# Patient Record
Sex: Female | Born: 1950
Health system: Southern US, Community
[De-identification: ages and names within clinical notes are randomized; demographics above are authoritative.]

## PROBLEM LIST (undated history)

## (undated) DIAGNOSIS — G8929 Other chronic pain: Secondary | ICD-10-CM

## (undated) DIAGNOSIS — L4050 Arthropathic psoriasis, unspecified: Secondary | ICD-10-CM

## (undated) DIAGNOSIS — G43909 Migraine, unspecified, not intractable, without status migrainosus: Secondary | ICD-10-CM

## (undated) DIAGNOSIS — E785 Hyperlipidemia, unspecified: Secondary | ICD-10-CM

## (undated) DIAGNOSIS — Z9889 Other specified postprocedural states: Secondary | ICD-10-CM

## (undated) DIAGNOSIS — G5601 Carpal tunnel syndrome, right upper limb: Secondary | ICD-10-CM

## (undated) DIAGNOSIS — Z5189 Encounter for other specified aftercare: Secondary | ICD-10-CM

## (undated) DIAGNOSIS — M1990 Unspecified osteoarthritis, unspecified site: Secondary | ICD-10-CM

## (undated) DIAGNOSIS — G5621 Lesion of ulnar nerve, right upper limb: Secondary | ICD-10-CM

## (undated) DIAGNOSIS — T7840XA Allergy, unspecified, initial encounter: Secondary | ICD-10-CM

## (undated) DIAGNOSIS — I10 Essential (primary) hypertension: Secondary | ICD-10-CM

## (undated) DIAGNOSIS — R0609 Other forms of dyspnea: Secondary | ICD-10-CM

## (undated) DIAGNOSIS — K219 Gastro-esophageal reflux disease without esophagitis: Secondary | ICD-10-CM

## (undated) DIAGNOSIS — D649 Anemia, unspecified: Secondary | ICD-10-CM

## (undated) DIAGNOSIS — M4802 Spinal stenosis, cervical region: Secondary | ICD-10-CM

## (undated) DIAGNOSIS — I700 Atherosclerosis of aorta: Secondary | ICD-10-CM

## (undated) DIAGNOSIS — R112 Nausea with vomiting, unspecified: Secondary | ICD-10-CM

## (undated) DIAGNOSIS — M8700 Idiopathic aseptic necrosis of unspecified bone: Secondary | ICD-10-CM

## (undated) DIAGNOSIS — G709 Myoneural disorder, unspecified: Secondary | ICD-10-CM

## (undated) DIAGNOSIS — L400 Psoriasis vulgaris: Secondary | ICD-10-CM

## (undated) DIAGNOSIS — M4326 Fusion of spine, lumbar region: Secondary | ICD-10-CM

## (undated) DIAGNOSIS — J189 Pneumonia, unspecified organism: Secondary | ICD-10-CM

## (undated) HISTORY — DX: Allergy, unspecified, initial encounter: T78.40XA

## (undated) HISTORY — PX: HIP SURGERY: SHX245

## (undated) HISTORY — PX: CLOSED REDUCTION HIP DISLOCATION: SUR221

## (undated) HISTORY — DX: Hyperlipidemia, unspecified: E78.5

## (undated) HISTORY — DX: Unspecified osteoarthritis, unspecified site: M19.90

## (undated) HISTORY — DX: Anemia, unspecified: D64.9

## (undated) HISTORY — PX: STEROID INJECTION TO SCAR: SHX2447

## (undated) HISTORY — DX: Gastro-esophageal reflux disease without esophagitis: K21.9

## (undated) HISTORY — DX: Myoneural disorder, unspecified: G70.9

## (undated) HISTORY — DX: Encounter for other specified aftercare: Z51.89

---

## 1982-12-24 HISTORY — PX: TOTAL ABDOMINAL HYSTERECTOMY W/ BILATERAL SALPINGOOPHORECTOMY: SHX83

## 1982-12-24 HISTORY — PX: APPENDECTOMY: SHX54

## 1992-12-24 HISTORY — PX: ANKLE FRACTURE SURGERY: SHX122

## 1992-12-24 HISTORY — PX: CHOLECYSTECTOMY: SHX55

## 1997-12-24 HISTORY — PX: LUMBAR FUSION: SHX111

## 1999-12-25 HISTORY — PX: HIP FRACTURE SURGERY: SHX118

## 2002-12-24 HISTORY — PX: LUMBAR FUSION: SHX111

## 2004-12-24 HISTORY — PX: ANKLE FUSION: SHX881

## 2007-12-25 HISTORY — PX: TOTAL HIP ARTHROPLASTY: SHX124

## 2009-07-01 LAB — TSH: TSH: 1.75 (ref <=5.90)

## 2010-07-03 LAB — HEPATIC FUNCTION PANEL
ALK PHOS: 61 (ref 25–125)
ALT: 29 (ref 7–35)
AST: 32 (ref 13–35)

## 2010-07-03 LAB — CBC AND DIFFERENTIAL
Hemoglobin: 12.9 (ref 12.0–16.0)
Platelets: 216 (ref 150–399)

## 2010-07-03 LAB — LIPID PANEL
CHOLESTEROL: 164 (ref 0–200)
HDL: 42 (ref 35–70)
LDL CALC: 97
TRIGLYCERIDES: 126 (ref 40–160)

## 2010-07-03 LAB — BASIC METABOLIC PANEL
BUN: 18 (ref 4–21)
Glucose: 91
Potassium: 4.2 (ref 3.4–5.3)
Sodium: 4 — AB (ref 137–147)

## 2010-12-24 HISTORY — PX: SACROILIAC JOINT FUSION: SHX6088

## 2011-12-25 HISTORY — PX: OTHER SURGICAL HISTORY: SHX169

## 2014-12-24 HISTORY — PX: ILIOTIBIAL BAND RELEASE: SHX675

## 2014-12-24 HISTORY — PX: LUMBAR FUSION: SHX111

## 2015-09-28 LAB — HM COLONOSCOPY

## 2016-07-05 LAB — CBC AND DIFFERENTIAL
HCT: 43 (ref 36–46)
Hemoglobin: 14.7 (ref 12.0–16.0)
NEUTROS ABS: 4157
Platelets: 215 (ref 150–399)
WBC: 8.2

## 2016-07-05 LAB — BASIC METABOLIC PANEL
BUN: 12 (ref 4–21)
CREATININE: 0.7 (ref 0.5–1.1)
Glucose: 98
POTASSIUM: 4.3 (ref 3.4–5.3)
SODIUM: 141 (ref 137–147)

## 2016-07-05 LAB — LIPID PANEL
CHOLESTEROL: 200 (ref 0–200)
HDL: 36 (ref 35–70)
LDL CALC: 91
Triglycerides: 367 — AB (ref 40–160)

## 2016-07-05 LAB — HEPATIC FUNCTION PANEL
ALK PHOS: 69 (ref 25–125)
ALT: 12 (ref 7–35)
AST: 16 (ref 13–35)
Bilirubin, Total: 0.4

## 2016-07-05 LAB — VITAMIN B12: Vitamin B-12: 345

## 2016-07-10 LAB — HM DEXA SCAN

## 2016-07-10 LAB — HM MAMMOGRAPHY

## 2017-07-31 DIAGNOSIS — Z96641 Presence of right artificial hip joint: Secondary | ICD-10-CM | POA: Diagnosis not present

## 2017-07-31 DIAGNOSIS — M25551 Pain in right hip: Secondary | ICD-10-CM | POA: Diagnosis not present

## 2017-07-31 DIAGNOSIS — M48061 Spinal stenosis, lumbar region without neurogenic claudication: Secondary | ICD-10-CM | POA: Diagnosis not present

## 2017-08-22 DIAGNOSIS — M5416 Radiculopathy, lumbar region: Secondary | ICD-10-CM | POA: Diagnosis not present

## 2017-08-22 DIAGNOSIS — Z79899 Other long term (current) drug therapy: Secondary | ICD-10-CM | POA: Diagnosis not present

## 2017-08-22 DIAGNOSIS — M25579 Pain in unspecified ankle and joints of unspecified foot: Secondary | ICD-10-CM | POA: Diagnosis not present

## 2017-08-22 DIAGNOSIS — M25551 Pain in right hip: Secondary | ICD-10-CM | POA: Diagnosis not present

## 2017-10-15 NOTE — Progress Notes (Signed)
abstract

## 2017-10-16 ENCOUNTER — Ambulatory Visit (INDEPENDENT_AMBULATORY_CARE_PROVIDER_SITE_OTHER): Payer: Medicare Other | Admitting: Family Medicine

## 2017-10-16 ENCOUNTER — Encounter: Payer: Self-pay | Admitting: Family Medicine

## 2017-10-16 ENCOUNTER — Telehealth: Payer: Self-pay

## 2017-10-16 VITALS — BP 160/88 | HR 56 | Temp 98.1°F | Resp 16 | Ht 69.0 in | Wt 196.0 lb

## 2017-10-16 DIAGNOSIS — E785 Hyperlipidemia, unspecified: Secondary | ICD-10-CM | POA: Diagnosis not present

## 2017-10-16 DIAGNOSIS — R030 Elevated blood-pressure reading, without diagnosis of hypertension: Secondary | ICD-10-CM | POA: Diagnosis not present

## 2017-10-16 DIAGNOSIS — Z114 Encounter for screening for human immunodeficiency virus [HIV]: Secondary | ICD-10-CM

## 2017-10-16 DIAGNOSIS — I10 Essential (primary) hypertension: Secondary | ICD-10-CM | POA: Insufficient documentation

## 2017-10-16 DIAGNOSIS — E663 Overweight: Secondary | ICD-10-CM

## 2017-10-16 DIAGNOSIS — Z1159 Encounter for screening for other viral diseases: Secondary | ICD-10-CM

## 2017-10-16 DIAGNOSIS — D510 Vitamin B12 deficiency anemia due to intrinsic factor deficiency: Secondary | ICD-10-CM

## 2017-10-16 DIAGNOSIS — M8580 Other specified disorders of bone density and structure, unspecified site: Secondary | ICD-10-CM

## 2017-10-16 DIAGNOSIS — S01501A Unspecified open wound of lip, initial encounter: Secondary | ICD-10-CM | POA: Diagnosis not present

## 2017-10-16 DIAGNOSIS — G43109 Migraine with aura, not intractable, without status migrainosus: Secondary | ICD-10-CM | POA: Diagnosis not present

## 2017-10-16 DIAGNOSIS — M797 Fibromyalgia: Secondary | ICD-10-CM

## 2017-10-16 DIAGNOSIS — Z7689 Persons encountering health services in other specified circumstances: Secondary | ICD-10-CM | POA: Diagnosis not present

## 2017-10-16 DIAGNOSIS — G43909 Migraine, unspecified, not intractable, without status migrainosus: Secondary | ICD-10-CM | POA: Insufficient documentation

## 2017-10-16 LAB — COMPLETE METABOLIC PANEL WITH GFR
AG RATIO: 1.2 (calc) (ref 1.0–2.5)
ALKALINE PHOSPHATASE (APISO): 86 U/L (ref 33–130)
ALT: 11 U/L (ref 6–29)
AST: 17 U/L (ref 10–35)
Albumin: 4.1 g/dL (ref 3.6–5.1)
BILIRUBIN TOTAL: 0.4 mg/dL (ref 0.2–1.2)
BUN: 12 mg/dL (ref 7–25)
CHLORIDE: 100 mmol/L (ref 98–110)
CO2: 28 mmol/L (ref 20–32)
Calcium: 9.1 mg/dL (ref 8.6–10.4)
Creat: 0.76 mg/dL (ref 0.50–0.99)
GFR, Est African American: 95 mL/min/{1.73_m2} (ref >=60)
GFR, Est Non African American: 82 mL/min/{1.73_m2} (ref >=60)
Globulin: 3.3 g/dL (calc) (ref 1.9–3.7)
Glucose, Bld: 90 mg/dL (ref 65–99)
POTASSIUM: 4.2 mmol/L (ref 3.5–5.3)
Sodium: 137 mmol/L (ref 135–146)
Total Protein: 7.4 g/dL (ref 6.1–8.1)

## 2017-10-16 LAB — LIPID PANEL
CHOLESTEROL: 211 mg/dL — AB (ref <200)
HDL: 41 mg/dL — AB (ref >50)
LDL Cholesterol (Calc): 132 mg/dL (calc) — ABNORMAL HIGH
Non-HDL Cholesterol (Calc): 170 mg/dL (calc) — ABNORMAL HIGH (ref <130)
Total CHOL/HDL Ratio: 5.1 (calc) — ABNORMAL HIGH (ref <5.0)
Triglycerides: 250 mg/dL — ABNORMAL HIGH (ref <150)

## 2017-10-16 LAB — VITAMIN B12: VITAMIN B 12: 298 pg/mL (ref 200–1100)

## 2017-10-16 LAB — CBC WITH DIFFERENTIAL/PLATELET
BASOS ABS: 62 {cells}/uL (ref 0–200)
Basophils Relative: 0.7 %
EOS PCT: 2 %
Eosinophils Absolute: 176 cells/uL (ref 15–500)
HCT: 42.4 % (ref 35.0–45.0)
Hemoglobin: 14.4 g/dL (ref 11.7–15.5)
Lymphs Abs: 1910 cells/uL (ref 850–3900)
MCH: 28.5 pg (ref 27.0–33.0)
MCHC: 34 g/dL (ref 32.0–36.0)
MCV: 83.8 fL (ref 80.0–100.0)
MONOS PCT: 5.7 %
MPV: 10.8 fL (ref 7.5–12.5)
NEUTROS ABS: 6151 {cells}/uL (ref 1500–7800)
NEUTROS PCT: 69.9 %
Platelets: 208 10*3/uL (ref 140–400)
RBC: 5.06 10*6/uL (ref 3.80–5.10)
RDW: 13.7 % (ref 11.0–15.0)
Total Lymphocyte: 21.7 %
WBC mixed population: 502 cells/uL (ref 200–950)
WBC: 8.8 10*3/uL (ref 3.8–10.8)

## 2017-10-16 MED ORDER — GABAPENTIN 400 MG PO CAPS: 400.0000 mg | ORAL_CAPSULE | Freq: Two times a day (BID) | ORAL | 2 refills | Status: DC

## 2017-10-16 MED ORDER — TIZANIDINE HCL 4 MG PO TABS: 4.0000 mg | ORAL_TABLET | Freq: Two times a day (BID) | ORAL | 2 refills | Status: DC

## 2017-10-16 MED ORDER — ESTRADIOL 2 MG PO TABS: 2.0000 mg | ORAL_TABLET | Freq: Every day | ORAL | 2 refills | Status: DC

## 2017-10-16 MED ORDER — NORTRIPTYLINE HCL 10 MG PO CAPS: 10.0000 mg | ORAL_CAPSULE | Freq: Every day | ORAL | 1 refills | Status: DC

## 2017-10-16 NOTE — Patient Instructions (Signed)

## 2017-10-16 NOTE — Assessment & Plan Note (Signed)
Chronic pain related to multiple orthopedic surgeries with likely some fibromyalgia component Continue gabapentin at current dose Continue Zanaflex Chronic narcotics managed by orthopedics Trial of nortriptyline for migraines as above

## 2017-10-16 NOTE — Assessment & Plan Note (Signed)
Dear well-controlled on preventive medication, beta blocker, but patient states she feels she is having side effects from this medication We will stop nadolol and try nortriptyline for migraine prophylaxis Continue sumatriptan as needed at the first sign of migraine Discussed not taking more than 2 doses in one day or 3 doses in one week of this medication Continue Phenergan as needed for migraine abortion Discussed return precautions Follow-up in one month and continue to titrate nortriptyline dose Warned about somnolent effects of nortriptyline, especially in combination with her other sedating medications like gabapentin, tizanidine, oxycodone This medication was chosen, because it is likely to also help with her sleep and chronic pain

## 2017-10-16 NOTE — Assessment & Plan Note (Signed)
We'll request records from previous PCP for this and other medical problems Discussed regular weightbearing exercise Discussed calcium and vitamin D supplementation Plan to recheck in 2 years after last bone density testing

## 2017-10-16 NOTE — Progress Notes (Signed)
Patient: Shannon Grimes Female    DOB: 1951-10-17   66 y.o.   MRN: [id number] Visit Date: 10/16/2017  Today's Provider: [name], MD   Chief Complaint  Patient presents with  . Establish Care   Subjective:    HPI    Shannon Grimes presents to establish care. She recently moved from [place] in July of this year. She is c/o migraine headaches. She is taking Imitrex and Nadolol for these headaches. She would like to D/C the Nadolol, due to side effects of elevated blood pressure. She was told previously that if she stops taking this medications, it can cause seizures. She has been running low on this medication, and has been taking 1/2 tab po qd x 3 weeks.  Does seem to help with migraines however.  She uses imitrex and phenergan as needed for migraine abortion.  She is hesitant to take the flu vaccine. She believes she had the pneumonia vaccine. She states her last colonoscopy was about 3 years ago, and she has a family H/O colon cancer (siblings). She states her last mammogram was over one year ago. She has been told that she has dense breast tissue.   Pernicious anemia: taking monthly B12 injections at previous PCP.  States this is a long standing issue.  Not sure what testing she had to diagnose this.  States she has had EGD in the past.  OA: s/p R hip replacement for AVN.  Had injury (fall from deck in 1994) that twisted R leg and led to many issues. Has had multiple spine surgeries and hip surgeries.  Has re-established with [facility] and they do pain management.  Has been diagnosed with some unspecified neuromuscular disorder and peripheral neuropathy.  Taking gabapentin 400mg  BID, Oxycontin 20mg  BID and Percocet q6h prn, tizanidine 4mg  BID prn. States that she had osteopenia on last BMD testing  HLD: Has h/o HLD but did not tolerate multiple statins due to pancreatitis.  He is currently involved with a clinical trial through the [facility] for a non-statin  cholesterol-lowering medication.    Lip wound: Patient with nonhealing wound on her right lower lip for the last year. She states she is tried steroid cream, Vaseline with no help. She is also tried cold sore treatment without relief. She states it is sometimes sore and burns. It will intermittently bleed as well. She reports she has had a lot of facial sun exposure while living in [place].    Allergies  Allergen Reactions  . Morphine   . Pentazocine   . Statins      Current Outpatient Prescriptions:  .  Cyanocobalamin (B-12 IJ), Inject 1 mL as directed every 30 (thirty) days., Disp: , Rfl:  .  estradiol (ESTRACE) 2 MG tablet, Take 2 mg by mouth daily., Disp: , Rfl:  .  SUMAtriptan (IMITREX) 100 MG tablet, Take 1 tablet by mouth daily as needed., Disp: , Rfl:  .  gabapentin (NEURONTIN) 400 MG capsule, Take 1 capsule by mouth 2 (two) times daily., Disp: , Rfl:  .  nadolol (CORGARD) 40 MG tablet, Take 1 tablet by mouth daily., Disp: , Rfl:  .  oxyCODONE-acetaminophen (PERCOCET) 10-325 MG tablet, Take 1 tablet by mouth every 6 (six) hours as needed., Disp: , Rfl: 0 .  OXYCONTIN 20 MG 12 hr tablet, Take 20 mg by mouth every 12 (twelve) hours., Disp: , Rfl: 0 .  promethazine (PHENERGAN) 25 MG tablet, Take 1 tablet by mouth every 4 (four)  hours as needed., Disp: , Rfl:  .  tiZANidine (ZANAFLEX) 4 MG tablet, Take 1 tablet by mouth 2 (two) times daily., Disp: , Rfl:   Review of Systems  Constitutional: Positive for diaphoresis and fatigue.  HENT: Positive for congestion, postnasal drip, rhinorrhea and sinus pain.   Eyes: Positive for photophobia.  Respiratory: Negative.   Cardiovascular: Negative.   Gastrointestinal: Positive for constipation, diarrhea and nausea.  Endocrine: Negative.   Genitourinary: Positive for enuresis.  Musculoskeletal: Positive for arthralgias, back pain, gait problem, joint swelling and myalgias.  Skin: Negative.   Allergic/Immunologic: Negative.   Neurological:  Positive for headaches.  Hematological: Negative.   Psychiatric/Behavioral: Negative.   All other systems reviewed and are negative.  Past Medical History:  Diagnosis Date  . Allergy   . Anemia    pernicious  . Blood transfusion without reported diagnosis    after hysterectomy  . GERD (gastroesophageal reflux disease)   . Neuromuscular disorder ([place])    multiple ortho issues with abnormal EMGs  . Osteoarthritis    Past Surgical History:  Procedure Laterality Date  . ANKLE FRACTURE SURGERY  1994   screws removed in 2007  . ANKLE FUSION Right 2006  . APPENDECTOMY  1984  . CHOLECYSTECTOMY  1994  . Hip abductor attachment Right 2013  . HIP FRACTURE SURGERY  2001  . ILIOTIBIAL BAND RELEASE  2016  . LUMBAR FUSION  1999   L5-S1 fusion  . LUMBAR FUSION  2004   L3-L4 fusion  . LUMBAR FUSION  2016   L2-L5, needed revision of previous surgeries  . SACROILIAC JOINT FUSION Right 2012  . TOTAL ABDOMINAL HYSTERECTOMY W/ BILATERAL SALPINGOOPHORECTOMY  1984   endometriosis  . TOTAL HIP ARTHROPLASTY Right 2009   Family History  Problem Relation Age of Onset  . Stroke Mother   . Congestive Heart Failure Mother   . Heart disease Father   . Colon cancer Sister   . Colon cancer Brother     Social History  Substance Use Topics  . Smoking status: Former Smoker    Years: 10.00    Types: Cigarettes    Quit date: 01/23/1983  . Smokeless tobacco: Never Used     Comment: was social smoker  . Alcohol use No   Objective:   BP (!) 160/88 (BP Location: Left Arm, Patient Position: Sitting, Cuff Size: Large)   Pulse (!) 56   Temp 98.1 F (36.7 C) (Oral)   Resp 16   Ht 5\' 9"  (1.753 m)   Wt 196 lb (88.9 kg)   BMI 28.94 kg/m  Vitals:   10/16/17 0909  BP: (!) 160/88  Pulse: (!) 56  Resp: 16  Temp: 98.1 F (36.7 C)  TempSrc: Oral  Weight: 196 lb (88.9 kg)  Height: 5\' 9"  (1.753 m)     Physical Exam  Constitutional: She is oriented to person, place, and time. She appears  well-developed and well-nourished. No distress.  Cardiovascular: Normal rate, regular rhythm, normal heart sounds and intact distal pulses.   No murmur heard. Pulmonary/Chest: Effort normal and breath sounds normal. No respiratory distress. She has no wheezes. She has no rales.  Abdominal: Soft. Bowel sounds are normal. She exhibits no distension. There is no tenderness. There is no rebound and no guarding.  Musculoskeletal: She exhibits no edema.  Neurological: She is alert and oriented to person, place, and time.  Skin: Skin is warm and dry. No rash noted.  Psychiatric: She has a normal mood and affect. Her  behavior is normal.  Vitals reviewed.      Assessment & Plan:      Problem List Items Addressed This Visit      Cardiovascular and Mediastinum   Migraines    Dear well-controlled on preventive medication, beta blocker, but patient states she feels she is having side effects from this medication We will stop nadolol and try nortriptyline for migraine prophylaxis Continue sumatriptan as needed at the first sign of migraine Discussed not taking more than 2 doses in one day or 3 doses in one week of this medication Continue Phenergan as needed for migraine abortion Discussed return precautions Follow-up in one month and continue to titrate nortriptyline dose Warned about somnolent effects of nortriptyline, especially in combination with her other sedating medications like gabapentin, tizanidine, oxycodone This medication was chosen, because it is likely to also help with her sleep and chronic pain      Relevant Medications   SUMAtriptan (IMITREX) 100 MG tablet   OXYCONTIN 20 MG 12 hr tablet   oxyCODONE-acetaminophen (PERCOCET) 10-325 MG tablet   nortriptyline (PAMELOR) 10 MG capsule   gabapentin (NEURONTIN) 400 MG capsule   tiZANidine (ZANAFLEX) 4 MG tablet     Digestive   Open wound of lip    History of non healing wound in a side exposed area is concerning for possible  squamous cell carcinoma This is not improved with treatment with steroid cream Advised Vaseline at least twice daily Does not appear to be HSV lesion Referral to dermatology for further evaluation      Relevant Orders   Ambulatory referral to Dermatology     Musculoskeletal and Integument   Osteopenia    We'll request records from previous PCP for this and other medical problems Discussed regular weightbearing exercise Discussed calcium and vitamin D supplementation Plan to recheck in 2 years after last bone density testing        Other   Fibromyalgia    Chronic pain related to multiple orthopedic surgeries with likely some fibromyalgia component Continue gabapentin at current dose Continue Zanaflex Chronic narcotics managed by orthopedics Trial of nortriptyline for migraines as above      Hyperlipidemia    Not currently on treatment Recheck lipid panel, CMP She will be treated as indicated per her clinical study In the future, could consider PCS K9      Relevant Orders   Comprehensive metabolic panel   Lipid panel   Overweight    Discussed diet and exercise      Relevant Orders   Comprehensive metabolic panel   Pernicious anemia    Recheck CBC and vitamin B-12 levels today Resume B-12 injections monthly      Relevant Medications   Cyanocobalamin (B-12 IJ)   Other Relevant Orders   B12   CBC w/Diff/Platelet   Elevated BP without diagnosis of hypertension    Blood pressures elevated Discuss with patient that her beta blocker is not increasing her blood pressure We will stop that and trying a different migraine prophylaxis medication Follow-up in one month and consider antihypertensive therapy Check CMP       Other Visit Diagnoses    Encounter to establish care    -  Primary   Screening for HIV (human immunodeficiency virus)       Relevant Orders   HIV antibody (with reflex)   Need for hepatitis C screening test       Relevant Orders   Hepatitis C  Antibody  Return in about 4 weeks (around 11/13/2017) for BP, migraine f/u.   The entirety of the information documented in the History of Present Illness, Review of Systems and Physical Exam were personally obtained by me. Portions of this information were initially documented by Raquel Sarna Ratchford, CMA and reviewed by me for thoroughness and accuracy.     [name], MD  [facility]

## 2017-10-16 NOTE — Assessment & Plan Note (Signed)
Discussed diet and exercise 

## 2017-10-16 NOTE — Assessment & Plan Note (Signed)
Blood pressures elevated Discuss with patient that her beta blocker is not increasing her blood pressure We will stop that and trying a different migraine prophylaxis medication Follow-up in one month and consider antihypertensive therapy Check CMP

## 2017-10-16 NOTE — Assessment & Plan Note (Signed)
History of non healing wound in a side exposed area is concerning for possible squamous cell carcinoma This is not improved with treatment with steroid cream Advised Vaseline at least twice daily Does not appear to be HSV lesion Referral to dermatology for further evaluation

## 2017-10-16 NOTE — Assessment & Plan Note (Signed)
Recheck CBC and vitamin B-12 levels today Resume B-12 injections monthly

## 2017-10-16 NOTE — Telephone Encounter (Signed)
Patient return call.  Thanks,  -Joseline

## 2017-10-16 NOTE — Assessment & Plan Note (Signed)
Not currently on treatment Recheck lipid panel, CMP She will be treated as indicated per her clinical study In the future, could consider PCS K9

## 2017-10-17 LAB — HEPATITIS C ANTIBODY
HEP C AB: NONREACTIVE
SIGNAL TO CUT-OFF: 0.01 (ref <1.00)

## 2017-10-17 LAB — HIV ANTIBODY (ROUTINE TESTING W REFLEX): HIV 1&2 Ab, 4th Generation: NONREACTIVE

## 2017-10-18 ENCOUNTER — Telehealth: Payer: Self-pay

## 2017-10-18 NOTE — Telephone Encounter (Signed)
Patient advised as directed below.  Thanks,  -Aubre Quincy 

## 2017-10-18 NOTE — Telephone Encounter (Signed)
-----   Message from Virginia Crews, MD sent at 10/17/2017  8:15 AM EDT ----- Normal kidney function, liver function, electrolytes, Blood counts.  Vit B12 level at lower limit of normal.  Will resume B12 shots at next visit.  Negative HIV and Hep C screening.  Cholesterol is high.  We will see how the study goes and not start a medication at this time.  Virginia Crews, MD, MPH [facility] 10/17/2017 8:15 AM

## 2017-10-24 ENCOUNTER — Ambulatory Visit (INDEPENDENT_AMBULATORY_CARE_PROVIDER_SITE_OTHER): Payer: Medicare Other

## 2017-10-24 DIAGNOSIS — Z23 Encounter for immunization: Secondary | ICD-10-CM

## 2017-10-29 DIAGNOSIS — L531 Erythema annulare centrifugum: Secondary | ICD-10-CM | POA: Diagnosis not present

## 2017-10-29 DIAGNOSIS — L439 Lichen planus, unspecified: Secondary | ICD-10-CM | POA: Diagnosis not present

## 2017-11-06 DIAGNOSIS — M25579 Pain in unspecified ankle and joints of unspecified foot: Secondary | ICD-10-CM | POA: Diagnosis not present

## 2017-11-06 DIAGNOSIS — M25551 Pain in right hip: Secondary | ICD-10-CM | POA: Diagnosis not present

## 2017-11-06 DIAGNOSIS — Z79899 Other long term (current) drug therapy: Secondary | ICD-10-CM | POA: Diagnosis not present

## 2017-11-06 DIAGNOSIS — M5416 Radiculopathy, lumbar region: Secondary | ICD-10-CM | POA: Diagnosis not present

## 2017-11-07 ENCOUNTER — Other Ambulatory Visit: Payer: Self-pay | Admitting: Physical Medicine and Rehabilitation

## 2017-11-07 DIAGNOSIS — M5416 Radiculopathy, lumbar region: Secondary | ICD-10-CM

## 2017-11-09 NOTE — Progress Notes (Signed)
Cardiology Office Note  Date:  11/11/2017   ID:  Shannon Grimes, DOB March 04, 1951, MRN [id number]  PCP:  Shannon Crews, MD   Chief Complaint  Patient presents with  . other    Self Referral. Patient is having BP issues and rapid heart beat. Meds reviewed verbally with patient.     HPI:  [name] is a pleasant 66 year old woman with past medical history of Pancreatitis "from statins" Smoker in her 23s Hyperlipidemia, statin intolerance Palpitations, Migraines Chronic orthopedic issues, chronic leg and back pain Who presents for evaluation of high blood pressure numbers, palpitations, elevated cholesterol  Many years ago fell off a porch, suffered fracture At the time she had gangrenous gallbladder Since that time, several surgeries later has chronic pain in leg and back  Recently with palpitations Seems to happen when she bends forward and other certain activities Unclear if this is persistent or single beats Possibly related to stress  Recently moved up from [place] to be near family  Previously on nadolol for migraines Off nadolol  since 10/24 as she did not like side effects  Reports that she is in a clinical trial for cholesterol through primary care  Previous total cholesterol 210 LDL 91  On her visit today blood pressure is elevated, mildly nervous Reports having recent orthopedic visit and systolic pressure was 093  EKG personally reviewed by myself on todays visit Shows normal sinus rhythm rate 86 bpm left axis deviation, prolonged QT   PMH:   has a past medical history of Allergy, Anemia, Blood transfusion without reported diagnosis, GERD (gastroesophageal reflux disease), Neuromuscular disorder ([place]), and Osteoarthritis.  PSH:    Past Surgical History:  Procedure Laterality Date  . ANKLE FRACTURE SURGERY  1994   screws removed in 2007  . ANKLE FUSION Right 2006  . APPENDECTOMY  1984  . CHOLECYSTECTOMY  1994  . Hip abductor attachment  Right 2013  . HIP FRACTURE SURGERY  2001  . ILIOTIBIAL BAND RELEASE  2016  . LUMBAR FUSION  1999   L5-S1 fusion  . LUMBAR FUSION  2004   L3-L4 fusion  . LUMBAR FUSION  2016   L2-L5, needed revision of previous surgeries  . SACROILIAC JOINT FUSION Right 2012  . TOTAL ABDOMINAL HYSTERECTOMY W/ BILATERAL SALPINGOOPHORECTOMY  1984   endometriosis  . TOTAL HIP ARTHROPLASTY Right 2009    Current Outpatient Medications  Medication Sig Dispense Refill  . amLODipine (NORVASC) 10 MG tablet Take 1 tablet (10 mg total) daily by mouth. 30 tablet 11  . Cyanocobalamin (B-12 IJ) Inject 1 mL as directed every 30 (thirty) days.    Marland Kitchen estradiol (ESTRACE) 2 MG tablet Take 1 tablet (2 mg total) by mouth daily. 90 tablet 2  . gabapentin (NEURONTIN) 400 MG capsule Take 1 capsule (400 mg total) by mouth 2 (two) times daily. 180 capsule 2  . nortriptyline (PAMELOR) 10 MG capsule Take 1 capsule (10 mg total) by mouth at bedtime. 30 capsule 1  . oxyCODONE-acetaminophen (PERCOCET) 10-325 MG tablet Take 1 tablet by mouth every 6 (six) hours as needed.  0  . OXYCONTIN 20 MG 12 hr tablet Take 20 mg by mouth every 12 (twelve) hours.  0  . promethazine (PHENERGAN) 25 MG tablet Take 1 tablet by mouth every 4 (four) hours as needed.    . SUMAtriptan (IMITREX) 100 MG tablet Take 1 tablet by mouth daily as needed.    Marland Kitchen tiZANidine (ZANAFLEX) 4 MG tablet Take 1 tablet (4 mg total)  by mouth 2 (two) times daily. 180 tablet 2   No current facility-administered medications for this visit.      Allergies:   Morphine; Pentazocine; and Statins   Social History:  The patient  reports that she quit smoking about 34 years ago. Her smoking use included cigarettes. She quit after 10.00 years of use. she has never used smokeless tobacco. She reports that she does not drink alcohol or use drugs.   Family History:   family history includes Colon cancer (age of onset: 94) in her sister; Colon cancer (age of onset: 35) in her brother;  Congestive Heart Failure in her mother; Heart disease (age of onset: 52) in her father; Mitral valve prolapse in her sister; Stroke (age of onset: 78) in her mother.    Review of Systems: Review of Systems  Constitutional: Negative.   Respiratory: Negative.   Cardiovascular: Positive for palpitations.  Gastrointestinal: Negative.   Musculoskeletal: Positive for back pain and joint pain.  Neurological: Negative.   Psychiatric/Behavioral: Negative.   All other systems reviewed and are negative.    PHYSICAL EXAM: VS:  BP (!) 198/102 (BP Location: Right Arm, Patient Position: Sitting, Cuff Size: Large)   Pulse 86   Ht 5\' 9"  (1.753 m)   Wt 195 lb 8 oz (88.7 kg)   BMI 28.87 kg/m  , BMI Body mass index is 28.87 kg/m. GEN: Well nourished, well developed, in no acute distress  HEENT: normal  Neck: no JVD, carotid bruits, or masses Cardiac: RRR; no murmurs, rubs, or gallops,no edema  Respiratory:  clear to auscultation bilaterally, normal work of breathing GI: soft, nontender, nondistended, + BS MS: no deformity or atrophy  Skin: warm and dry, no rash Neuro:  Strength and sensation are intact Psych: euthymic mood, full affect    Recent Labs: 10/16/2017: ALT 11; BUN 12; Creat 0.76; Hemoglobin 14.4; Platelets 208; Potassium 4.2; Sodium 137    Lipid Panel Lab Results  Component Value Date   CHOL 211 (H) 10/16/2017   HDL 41 (L) 10/16/2017   LDLCALC 91 07/05/2016   TRIG 250 (H) 10/16/2017      Wt Readings from Last 3 Encounters:  11/11/17 195 lb 8 oz (88.7 kg)  10/16/17 196 lb (88.9 kg)       ASSESSMENT AND PLAN:  Hyperlipidemia, unspecified hyperlipidemia type - Plan: CT CARDIAC SCORING She reports that she is in a trial through primary care for her cholesterol We have ordered CT coronary calcium scoring result risk stratification  Elevated BP without diagnosis of hypertension Blood pressure markedly elevated today, mildly anxious Reports is well controlled when  she was seen recently by orthopedics She does not have blood pressure measurements at home, recommend she buy a new blood pressure cuff and monitor numbers We have given her amlodipine 10 mg daily to take if blood pressure runs high She will call us with numbers in the next weeks for further medication adjustment  Palpitations Recommended she keep a monitor of her heart rate Use with blood pressure cuff and pulse meter on her phone If there is concern for irregular rhythm or tachycardia we would order a event monitor Event monitor was shown in detail today, she will call us if she feels her symptoms warrant monitoring  Other chronic back pain [place] off a porch many years ago, several surgeries on her back and leg Has chronic pain She is restarting water therapy  Disposition:   She will call us with blood pressure measurements  Total encounter time more than 45 minutes  Greater than 50% was spent in counseling and coordination of care with the patient   Orders Placed This Encounter  Procedures  . CT CARDIAC SCORING  . EKG 12-Lead     Signed, Esmond Plants, M.D., Ph.D. 11/11/2017  [place], [place]

## 2017-11-11 ENCOUNTER — Ambulatory Visit (INDEPENDENT_AMBULATORY_CARE_PROVIDER_SITE_OTHER): Payer: Medicare Other | Admitting: Cardiovascular Disease

## 2017-11-11 ENCOUNTER — Encounter: Payer: Self-pay | Admitting: Cardiovascular Disease

## 2017-11-11 VITALS — BP 198/102 | HR 86 | Ht 69.0 in | Wt 195.5 lb

## 2017-11-11 DIAGNOSIS — G8929 Other chronic pain: Secondary | ICD-10-CM

## 2017-11-11 DIAGNOSIS — E785 Hyperlipidemia, unspecified: Secondary | ICD-10-CM | POA: Diagnosis not present

## 2017-11-11 DIAGNOSIS — R030 Elevated blood-pressure reading, without diagnosis of hypertension: Secondary | ICD-10-CM

## 2017-11-11 DIAGNOSIS — R002 Palpitations: Secondary | ICD-10-CM | POA: Diagnosis not present

## 2017-11-11 DIAGNOSIS — M549 Dorsalgia, unspecified: Secondary | ICD-10-CM | POA: Diagnosis not present

## 2017-11-11 DIAGNOSIS — R000 Tachycardia, unspecified: Secondary | ICD-10-CM | POA: Diagnosis not present

## 2017-11-11 MED ORDER — AMLODIPINE BESYLATE 10 MG PO TABS: 10.0000 mg | ORAL_TABLET | Freq: Every day | ORAL | 11 refills | Status: DC

## 2017-11-11 NOTE — Patient Instructions (Addendum)
Medication Instructions:   Take amlodipine/norvasc 1/4 pill up to whole pill as needed for high blood pressure  Measure blood pressures Goal 130 to 140  Labwork: No labs needed  Testing/Procedures: CT Cardiac scoring for family hx, high cholesterol $150.00 [phone]  [address] [place], [place] [address]  Follow-Up: It was a pleasure seeing you in the office today. Please call us if you have new issues that need to be addressed before your next appt.  [phone]  Your physician wants you to follow-up in:  as needed  If you need a refill on your cardiac medications before your next appointment, please call your pharmacy.

## 2017-11-12 DIAGNOSIS — D692 Other nonthrombocytopenic purpura: Secondary | ICD-10-CM | POA: Diagnosis not present

## 2017-11-12 DIAGNOSIS — L439 Lichen planus, unspecified: Secondary | ICD-10-CM | POA: Diagnosis not present

## 2017-11-12 DIAGNOSIS — L531 Erythema annulare centrifugum: Secondary | ICD-10-CM | POA: Diagnosis not present

## 2017-11-13 ENCOUNTER — Ambulatory Visit
Admission: RE | Admit: 2017-11-13 | Discharge: 2017-11-13 | Disposition: A | Discharge status: Home / Self Care | Payer: Medicare Other | Source: Ambulatory Visit | Attending: Physical Medicine and Rehabilitation | Admitting: Physical Medicine and Rehabilitation

## 2017-11-13 DIAGNOSIS — M5135 Other intervertebral disc degeneration, thoracolumbar region: Secondary | ICD-10-CM | POA: Diagnosis not present

## 2017-11-13 DIAGNOSIS — M5126 Other intervertebral disc displacement, lumbar region: Secondary | ICD-10-CM | POA: Diagnosis not present

## 2017-11-13 DIAGNOSIS — M48061 Spinal stenosis, lumbar region without neurogenic claudication: Secondary | ICD-10-CM | POA: Insufficient documentation

## 2017-11-13 DIAGNOSIS — Z981 Arthrodesis status: Secondary | ICD-10-CM | POA: Diagnosis not present

## 2017-11-13 DIAGNOSIS — M5416 Radiculopathy, lumbar region: Secondary | ICD-10-CM | POA: Insufficient documentation

## 2017-11-19 ENCOUNTER — Ambulatory Visit (INDEPENDENT_AMBULATORY_CARE_PROVIDER_SITE_OTHER): Payer: Medicare Other | Admitting: Family Medicine

## 2017-11-19 VITALS — BP 152/82 | HR 84 | Temp 98.0°F | Resp 16 | Wt 196.0 lb

## 2017-11-19 DIAGNOSIS — Z8349 Family history of other endocrine, nutritional and metabolic diseases: Secondary | ICD-10-CM

## 2017-11-19 DIAGNOSIS — G43109 Migraine with aura, not intractable, without status migrainosus: Secondary | ICD-10-CM | POA: Diagnosis not present

## 2017-11-19 DIAGNOSIS — D510 Vitamin B12 deficiency anemia due to intrinsic factor deficiency: Secondary | ICD-10-CM | POA: Diagnosis not present

## 2017-11-19 DIAGNOSIS — I10 Essential (primary) hypertension: Secondary | ICD-10-CM | POA: Diagnosis not present

## 2017-11-19 DIAGNOSIS — Z23 Encounter for immunization: Secondary | ICD-10-CM | POA: Diagnosis not present

## 2017-11-19 DIAGNOSIS — R5382 Chronic fatigue, unspecified: Secondary | ICD-10-CM | POA: Diagnosis not present

## 2017-11-19 LAB — TSH: TSH: 1.88 mIU/L (ref 0.40–4.50)

## 2017-11-19 MED ORDER — NORTRIPTYLINE HCL 25 MG PO CAPS: ORAL_CAPSULE | ORAL | 0 refills | Status: DC

## 2017-11-19 MED ORDER — HYDROCHLOROTHIAZIDE 25 MG PO TABS: ORAL_TABLET | ORAL | 1 refills | Status: DC

## 2017-11-19 MED ORDER — CYANOCOBALAMIN 1000 MCG/ML IJ SOLN
1000.0000 ug | Freq: Once | INTRAMUSCULAR | Status: AC
  Administered 2017-11-19: 1000 ug via INTRAMUSCULAR

## 2017-11-19 NOTE — Progress Notes (Signed)
Patient: Shannon Grimes Female    DOB: August 09, 1951   66 y.o.   MRN: [id number] Visit Date: 11/20/2017  Today's Provider: [name], MD   Chief Complaint  Patient presents with  . Migraine  . Hypertension   Subjective:    HPI     Follow up for Migraines  The patient was last seen for this 1 month ago. Changes made at last visit include D/C Nadolol secondary to side effects. Pt was started on Nortriptyline, and advised to continue sumatriptan PRN.  She reports good compliance with treatment. She feels that condition is Improved. She is having side effects. Insomnia.  ------------------------------------------------------------------------------------ Elevated Blood Pressure BP Readings from Last 3 Encounters:  11/19/17 (!) 152/82  11/11/17 (!) 198/102  10/16/17 (!) 160/88   Pt saw Dr. Rockey Situ (cardiology) on 11/11/2017. Pt was given amlodipine 10 mg to take if BP runs high. Pt's BP was elevated at that OV, which was believed to be due to anxiety. Per cardiology note,pt reports BP is well controlled when she was seen recently by orthopedics. She does not have blood pressure measurements at home, and Dr. Rockey Situ recommended she buy a new blood pressure cuff and monitor numbers. Pt states Dr. Rockey Situ found a right carotid bruit, and he recommended carotid imaging, which she is having done in 2 days. She is taking 1/2 tab of the amlodipine, because it caused swelling.  Pernicious anemia- previously diagnosed.  Was getting monthly B12 injections.  Feeling fatigued.   Allergies  Allergen Reactions  . Morphine   . Pentazocine   . Statins      Current Outpatient Medications:  .  estradiol (ESTRACE) 2 MG tablet, Take 1 tablet (2 mg total) by mouth daily., Disp: 90 tablet, Rfl: 2 .  fluconazole (DIFLUCAN) 200 MG tablet, fluconazole 200 mg tablet  TAKE 1(ONE) TABLET(S) ORAL EVERY DAY, Disp: , Rfl:  .  gabapentin (NEURONTIN) 400 MG capsule, Take 1 capsule (400 mg  total) by mouth 2 (two) times daily., Disp: 180 capsule, Rfl: 2 .  nortriptyline (PAMELOR) 25 MG capsule, Take 1 capsule (25 mg total) by mouth at bedtime for 14 days, THEN 2 capsules (50 mg total) at bedtime for 14 days., Disp: 42 capsule, Rfl: 0 .  oxyCODONE-acetaminophen (PERCOCET) 10-325 MG tablet, Take 1 tablet by mouth every 6 (six) hours as needed., Disp: , Rfl: 0 .  OXYCONTIN 20 MG 12 hr tablet, Take 20 mg by mouth every 12 (twelve) hours., Disp: , Rfl: 0 .  promethazine (PHENERGAN) 25 MG tablet, Take 1 tablet by mouth every 4 (four) hours as needed., Disp: , Rfl:  .  SUMAtriptan (IMITREX) 100 MG tablet, Take 1 tablet by mouth daily as needed., Disp: , Rfl:  .  tiZANidine (ZANAFLEX) 4 MG tablet, Take 1 tablet (4 mg total) by mouth 2 (two) times daily., Disp: 180 tablet, Rfl: 2 .  Cyanocobalamin (B-12) 1000 MCG/ML KIT, Inject 1 mL as directed every 30 (thirty) days., Disp: 1 kit, Rfl: 11 .  hydrochlorothiazide (HYDRODIURIL) 25 MG tablet, Take 0.5 tablets (12.5 mg total) by mouth daily for 14 days, THEN 1 tablet (25 mg total) daily for 14 days., Disp: 30 tablet, Rfl: 1  Review of Systems  Constitutional: Positive for diaphoresis. Negative for activity change, appetite change, chills, fatigue, fever and unexpected weight change.  Respiratory: Positive for shortness of breath.   Cardiovascular: Positive for palpitations. Negative for chest pain and leg swelling.  Musculoskeletal: Positive for arthralgias.  Social History   Tobacco Use  . Smoking status: Former Smoker    Years: 10.00    Types: Cigarettes    Last attempt to quit: 01/23/1983    Years since quitting: 34.8  . Smokeless tobacco: Never Used  . Tobacco comment: was social smoker, not everyday  Substance Use Topics  . Alcohol use: No   Objective:   BP (!) 152/82 (BP Location: Left Arm, Patient Position: Sitting, Cuff Size: Large)   Pulse 84   Temp 98 F (36.7 C) (Oral)   Resp 16   Wt 196 lb (88.9 kg)   BMI 28.94  kg/m  Vitals:   11/19/17 0848  BP: (!) 152/82  Pulse: 84  Resp: 16  Temp: 98 F (36.7 C)  TempSrc: Oral  Weight: 196 lb (88.9 kg)     Physical Exam  Constitutional: She is oriented to person, place, and time. She appears well-developed and well-nourished. No distress.  HENT:  Head: Normocephalic and atraumatic.  Eyes: Conjunctivae are normal. Pupils are equal, round, and reactive to light. No scleral icterus.  Neck: Neck supple. No thyromegaly present.  Cardiovascular: Normal rate, regular rhythm, normal heart sounds and intact distal pulses.  No murmur heard. Pulmonary/Chest: Breath sounds normal. No respiratory distress. She has no wheezes. She has no rales.  Musculoskeletal: She exhibits no edema or deformity.  Lymphadenopathy:    She has no cervical adenopathy.  Neurological: She is alert and oriented to person, place, and time.  Skin: Skin is warm and dry. No rash noted.  Psychiatric: She has a normal mood and affect. Her behavior is normal.  Vitals reviewed.       Assessment & Plan:      Problem List Items Addressed This Visit      Cardiovascular and Mediastinum   Migraines - Primary    Nadolol stopped at last visit due to reported side effects Remains on low dose nortriptyline with unchanged migraines Continue sumatriptan and phenergan prn Titrate up nortriptyline dose (see instructions) F/u in 4-6 weeks      Relevant Medications   nortriptyline (PAMELOR) 25 MG capsule   hydrochlorothiazide (HYDRODIURIL) 25 MG tablet   Hypertension    Unable to tolerate amlodipine due to edema Uncontrolled today Recent BMP reviewed  D/c amlodipine and start HCTZ - start with 12.62m daily and increase to 260mdaily after 2 weeks if BP remains elevated Discussed goal BP <140/90 Discussed return precautions      Relevant Medications   hydrochlorothiazide (HYDRODIURIL) 25 MG tablet   Other Relevant Orders   TSH (Completed)     Other   Pernicious anemia    Last CBC  without anemia and B12 level lower end of normal Resume monthly B12 injections - given one today in clinic Will Rx monthly shots to be done at home - we can instruct and have her give first dose at f/u appt in 1 month      Relevant Medications   cyanocobalamin ((VITAMIN B-12)) injection 1,000 mcg (Completed)   Cyanocobalamin (B-12) 1000 MCG/ML KIT    Other Visit Diagnoses    Chronic fatigue       Relevant Orders   TSH (Completed)   Family history of thyroid disease       Relevant Orders   TSH (Completed)   Need for pneumococcal vaccination          Return in about 4 weeks (around 12/17/2017) for BP, migraine f/u.     The entirety of the information  documented in the History of Present Illness, Review of Systems and Physical Exam were personally obtained by me. Portions of this information were initially documented by Raquel Sarna Ratchford, CMA and reviewed by me for thoroughness and accuracy.     [name], MD  [facility]

## 2017-11-19 NOTE — Patient Instructions (Signed)
We will increase notriptyline to 25mg .  After 2 weeks, can increase to 50mg .  Stop amlodipine and start HCTZ.  Start with 12.5mg  daily and increase to 25 mg after 2 weeks.

## 2017-11-20 ENCOUNTER — Telehealth: Payer: Self-pay

## 2017-11-20 ENCOUNTER — Telehealth: Payer: Self-pay | Admitting: Family Medicine

## 2017-11-20 MED ORDER — B-12 1000 MCG/ML IJ KIT: 1.0000 mL | PACK | INTRAMUSCULAR | 11 refills | Status: DC

## 2017-11-20 NOTE — Telephone Encounter (Signed)
Please review

## 2017-11-20 NOTE — Telephone Encounter (Signed)
We will watch for PA.  In the meantime, patient can also get shots in our office.  Virginia Crews, MD, MPH [facility] 11/20/2017 4:40 PM

## 2017-11-20 NOTE — Telephone Encounter (Signed)
Pt wanted to let Dr. B know that her insurance denied coverage for her B-12 through the pharmacy. Please advise. Thanks TNP

## 2017-11-20 NOTE — Assessment & Plan Note (Signed)
Unable to tolerate amlodipine due to edema Uncontrolled today Recent BMP reviewed  D/c amlodipine and start HCTZ - start with 12.5mg  daily and increase to 25mg  daily after 2 weeks if BP remains elevated Discussed goal BP <140/90 Discussed return precautions

## 2017-11-20 NOTE — Assessment & Plan Note (Signed)
Nadolol stopped at last visit due to reported side effects Remains on low dose nortriptyline with unchanged migraines Continue sumatriptan and phenergan prn Titrate up nortriptyline dose (see instructions) F/u in 4-6 weeks

## 2017-11-20 NOTE — Assessment & Plan Note (Signed)
Last CBC without anemia and B12 level lower end of normal Resume monthly B12 injections - given one today in clinic Will Rx monthly shots to be done at home - we can instruct and have her give first dose at f/u appt in 1 month

## 2017-11-20 NOTE — Telephone Encounter (Signed)
Pt advised.

## 2017-11-20 NOTE — Telephone Encounter (Signed)
-----   Message from Virginia Crews, MD sent at 11/20/2017  1:35 PM EST ----- Normal thyroid function  Bacigalupo, [name], MD, MPH [facility] 11/20/2017 1:35 PM

## 2017-11-21 ENCOUNTER — Ambulatory Visit (INDEPENDENT_AMBULATORY_CARE_PROVIDER_SITE_OTHER)
Admission: RE | Admit: 2017-11-21 | Discharge: 2017-11-21 | Disposition: A | Discharge status: Home / Self Care | Payer: Self-pay | Source: Ambulatory Visit | Attending: Cardiovascular Disease | Admitting: Cardiovascular Disease

## 2017-11-21 DIAGNOSIS — E785 Hyperlipidemia, unspecified: Secondary | ICD-10-CM

## 2017-11-21 NOTE — Telephone Encounter (Signed)
Pt states the pharmacy had a coupon for the injection, and pt only has to pay $6.00 out of pocket for the rx.

## 2017-11-25 ENCOUNTER — Telehealth: Payer: Self-pay | Admitting: Family Medicine

## 2017-11-25 NOTE — Telephone Encounter (Signed)
Pt states she has been taking the Rx hydrochlorothiazide (HYDRODIURIL) 25 MG tablet  In the morning she has been taking 1 1/2 tablets and at night taking a full tablet by mistake, has been doing this for about a week.  Pt states she got it confused with another medication.  Pt states she is weak, sleepy and having some nausea but thinks it is coming from the amount if medication she has been taking Please advise with pt.   Pt states yesterday her blood pressure was 171/100.   CB#[phone]/MW

## 2017-11-25 NOTE — Telephone Encounter (Signed)
Pt advised. Denies sx of chest pain, SOB, palpitations. Pt agrees with tx plan.

## 2017-11-25 NOTE — Telephone Encounter (Signed)
Seems BP is not low given that measurement.  If having chest pain, palpitations, or shortness of breath, should be seen.  Keep watching BP.  Go back to 1 pill once daily of HCTZ 25mg .  Virginia Crews, MD, MPH [facility] 11/25/2017 10:24 AM

## 2017-11-27 DIAGNOSIS — M5416 Radiculopathy, lumbar region: Secondary | ICD-10-CM | POA: Diagnosis not present

## 2017-11-27 DIAGNOSIS — M25551 Pain in right hip: Secondary | ICD-10-CM | POA: Diagnosis not present

## 2017-11-27 DIAGNOSIS — Z79899 Other long term (current) drug therapy: Secondary | ICD-10-CM | POA: Diagnosis not present

## 2017-11-27 DIAGNOSIS — M25579 Pain in unspecified ankle and joints of unspecified foot: Secondary | ICD-10-CM | POA: Diagnosis not present

## 2017-12-11 DIAGNOSIS — M48061 Spinal stenosis, lumbar region without neurogenic claudication: Secondary | ICD-10-CM | POA: Diagnosis not present

## 2017-12-14 ENCOUNTER — Other Ambulatory Visit: Payer: Self-pay | Admitting: Family Medicine

## 2017-12-16 NOTE — Telephone Encounter (Signed)
Please review for Dr. B 

## 2017-12-20 ENCOUNTER — Ambulatory Visit (INDEPENDENT_AMBULATORY_CARE_PROVIDER_SITE_OTHER): Payer: Medicare Other | Admitting: Family Medicine

## 2017-12-20 ENCOUNTER — Encounter: Payer: Self-pay | Admitting: Family Medicine

## 2017-12-20 VITALS — BP 130/82 | HR 85 | Temp 98.1°F | Resp 16 | Wt 191.0 lb

## 2017-12-20 DIAGNOSIS — I10 Essential (primary) hypertension: Secondary | ICD-10-CM | POA: Diagnosis not present

## 2017-12-20 DIAGNOSIS — G43109 Migraine with aura, not intractable, without status migrainosus: Secondary | ICD-10-CM | POA: Diagnosis not present

## 2017-12-20 MED ORDER — HYDROCHLOROTHIAZIDE 25 MG PO TABS: 25.0000 mg | ORAL_TABLET | Freq: Every day | ORAL | 1 refills | Status: DC

## 2017-12-20 MED ORDER — NORTRIPTYLINE HCL 25 MG PO CAPS: 25.0000 mg | ORAL_CAPSULE | Freq: Every day | ORAL | 1 refills | Status: DC

## 2017-12-20 NOTE — Progress Notes (Signed)
     Patient: Shannon Grimes Female    DOB: 10/09/1951   66 y.o.   MRN: [id number] Visit Date: 12/20/2017  Today's Provider:  , MD   I, Emily Ratchford, CMA, am acting as scribe for  , MD.  Chief Complaint  Patient presents with  . Migraine  . Hypertension   Subjective:    HPI      Hypertension, follow-up:  BP Readings from Last 3 Encounters:  12/20/17 130/82  11/19/17 (!) 152/82  11/11/17 (!) 198/102    She was last seen for hypertension 1 month ago.  BP at that visit was 152/82. Management since that visit includes D/C amlodipine due to edema, and start HCTZ. She reports good compliance with treatment. She is not having side effects.  She is exercising. Walking x 30 minutes. She is adherent to low salt diet.   Outside blood pressures are ranging from 120's-150's/70's-80's. She is experiencing none.  Patient denies chest pain, chest pressure/discomfort, claudication, dyspnea, exertional chest pressure/discomfort, fatigue, irregular heart beat, lower extremity edema, near-syncope, orthopnea, palpitations and syncope.   Cardiovascular risk factors include advanced age (older than 55 for men, 65 for women) and hypertension.    Weight trend: fluctuating a bit Wt Readings from Last 3 Encounters:  12/20/17 191 lb (86.6 kg)  11/19/17 196 lb (88.9 kg)  11/11/17 195 lb 8 oz (88.7 kg)    Current diet: in general, a "healthy" diet    ------------------------------------------------------------------------  Follow up for migraines  The patient was last seen for this 1 month ago. Changes made at last visit include continuing sumatriptan and phenergan, and titrate up nortriptyline.  She reports good compliance with treatment. She feels that condition is Improved. She states she has only had 1 migraine since starting this. She is not having side effects.    ------------------------------------------------------------------------------------    Allergies  Allergen Reactions  . Morphine   . Pentazocine   . Statins      Current Outpatient Medications:  .  Cyanocobalamin (B-12) 1000 MCG/ML KIT, Inject 1 mL as directed every 30 (thirty) days., Disp: 1 kit, Rfl: 11 .  estradiol (ESTRACE) 2 MG tablet, Take 1 tablet (2 mg total) by mouth daily., Disp: 90 tablet, Rfl: 2 .  gabapentin (NEURONTIN) 400 MG capsule, Take 1 capsule (400 mg total) by mouth 2 (two) times daily., Disp: 180 capsule, Rfl: 2 .  hydrochlorothiazide (HYDRODIURIL) 25 MG tablet, Take 0.5 tablets (12.5 mg total) by mouth daily for 14 days, THEN 1 tablet (25 mg total) daily for 14 days., Disp: 30 tablet, Rfl: 1 .  nortriptyline (PAMELOR) 25 MG capsule, PLEASE SEE ATTACHED FOR DETAILED DIRECTIONS, Disp: 42 capsule, Rfl: 0 .  oxyCODONE-acetaminophen (PERCOCET) 10-325 MG tablet, Take 1 tablet by mouth every 6 (six) hours as needed., Disp: , Rfl: 0 .  OXYCONTIN 20 MG 12 hr tablet, Take 20 mg by mouth every 12 (twelve) hours., Disp: , Rfl: 0 .  promethazine (PHENERGAN) 25 MG tablet, Take 1 tablet by mouth every 4 (four) hours as needed., Disp: , Rfl:  .  SUMAtriptan (IMITREX) 100 MG tablet, Take 1 tablet by mouth daily as needed., Disp: , Rfl:  .  tiZANidine (ZANAFLEX) 4 MG tablet, Take 1 tablet (4 mg total) by mouth 2 (two) times daily., Disp: 180 tablet, Rfl: 2  Review of Systems  Constitutional: Negative for fatigue.  Respiratory: Negative for shortness of breath.   Cardiovascular: Negative for chest pain, palpitations and leg swelling.  Neurological: Positive for   headaches.    Social History   Tobacco Use  . Smoking status: Former Smoker    Years: 10.00    Types: Cigarettes    Last attempt to quit: 01/23/1983    Years since quitting: 34.9  . Smokeless tobacco: Never Used  . Tobacco comment: was social smoker, not everyday  Substance Use Topics  . Alcohol use: No    Objective:   BP 130/82 (BP Location: Left Arm, Patient Position: Sitting, Cuff Size: Large)   Pulse 85   Temp 98.1 F (36.7 C) (Oral)   Resp 16   Wt 191 lb (86.6 kg)   BMI 28.21 kg/m  Vitals:   12/20/17 1113  BP: 130/82  Pulse: 85  Resp: 16  Temp: 98.1 F (36.7 C)  TempSrc: Oral  Weight: 191 lb (86.6 kg)     Physical Exam  Constitutional: She is oriented to person, place, and time. She appears well-developed and well-nourished. No distress.  HENT:  Head: Normocephalic and atraumatic.  Eyes: Conjunctivae and EOM are normal. No scleral icterus.  Cardiovascular: Normal rate, regular rhythm, normal heart sounds and intact distal pulses.  No murmur heard. Pulmonary/Chest: Effort normal and breath sounds normal. No respiratory distress. She has no wheezes. She has no rales.  Musculoskeletal: She exhibits no edema.  Neurological: She is alert and oriented to person, place, and time.  Psychiatric: She has a normal mood and affect. Her behavior is normal.  Vitals reviewed.       Assessment & Plan:      Problem List Items Addressed This Visit      Cardiovascular and Mediastinum   Migraines    Well controlled Unable to tolerate any higher dose, so continue nortriptyline at 25mg qhs Can use sumatriptan prn F/u in 3 months      Relevant Medications   hydrochlorothiazide (HYDRODIURIL) 25 MG tablet   nortriptyline (PAMELOR) 25 MG capsule   Hypertension - Primary    Well controlled today Continue HCTZ at current dose Recent CMP wnl F/u in 3 months      Relevant Medications   hydrochlorothiazide (HYDRODIURIL) 25 MG tablet      Return in about 3 months (around 03/20/2018) for chronic disease management.      The entirety of the information documented in the History of Present Illness, Review of Systems and Physical Exam were personally obtained by me. Portions of this information were initially documented by Emily Ratchford, CMA and reviewed by me for thoroughness  and accuracy.    ,  M, MD, MPH [facility] 12/20/2017 11:51 AM   

## 2017-12-20 NOTE — Assessment & Plan Note (Signed)
Well controlled today Continue HCTZ at current dose Recent CMP wnl F/u in 3 months

## 2017-12-20 NOTE — Assessment & Plan Note (Signed)
Well controlled Unable to tolerate any higher dose, so continue nortriptyline at 25mg  qhs Can use sumatriptan prn F/u in 3 months

## 2017-12-25 DIAGNOSIS — M5416 Radiculopathy, lumbar region: Secondary | ICD-10-CM | POA: Diagnosis not present

## 2017-12-25 DIAGNOSIS — M25579 Pain in unspecified ankle and joints of unspecified foot: Secondary | ICD-10-CM | POA: Diagnosis not present

## 2017-12-25 DIAGNOSIS — M25551 Pain in right hip: Secondary | ICD-10-CM | POA: Diagnosis not present

## 2017-12-25 DIAGNOSIS — Z79899 Other long term (current) drug therapy: Secondary | ICD-10-CM | POA: Diagnosis not present

## 2018-01-02 DIAGNOSIS — M47817 Spondylosis without myelopathy or radiculopathy, lumbosacral region: Secondary | ICD-10-CM | POA: Diagnosis not present

## 2018-01-13 ENCOUNTER — Telehealth: Payer: Self-pay | Admitting: Family Medicine

## 2018-01-15 DIAGNOSIS — N6082 Other benign mammary dysplasias of left breast: Secondary | ICD-10-CM | POA: Diagnosis not present

## 2018-01-15 DIAGNOSIS — L578 Other skin changes due to chronic exposure to nonionizing radiation: Secondary | ICD-10-CM | POA: Diagnosis not present

## 2018-01-15 DIAGNOSIS — Z79891 Long term (current) use of opiate analgesic: Secondary | ICD-10-CM | POA: Diagnosis not present

## 2018-01-15 DIAGNOSIS — M25551 Pain in right hip: Secondary | ICD-10-CM | POA: Diagnosis not present

## 2018-01-15 DIAGNOSIS — M5416 Radiculopathy, lumbar region: Secondary | ICD-10-CM | POA: Diagnosis not present

## 2018-01-15 DIAGNOSIS — L910 Hypertrophic scar: Secondary | ICD-10-CM | POA: Diagnosis not present

## 2018-01-15 DIAGNOSIS — Z79899 Other long term (current) drug therapy: Secondary | ICD-10-CM | POA: Diagnosis not present

## 2018-01-15 DIAGNOSIS — M25571 Pain in right ankle and joints of right foot: Secondary | ICD-10-CM | POA: Diagnosis not present

## 2018-01-15 DIAGNOSIS — L710 Perioral dermatitis: Secondary | ICD-10-CM | POA: Diagnosis not present

## 2018-01-15 DIAGNOSIS — D485 Neoplasm of uncertain behavior of skin: Secondary | ICD-10-CM | POA: Diagnosis not present

## 2018-01-15 DIAGNOSIS — M25572 Pain in left ankle and joints of left foot: Secondary | ICD-10-CM | POA: Diagnosis not present

## 2018-01-17 ENCOUNTER — Ambulatory Visit (INDEPENDENT_AMBULATORY_CARE_PROVIDER_SITE_OTHER): Payer: Medicare Other | Admitting: *Deleted

## 2018-01-17 ENCOUNTER — Ambulatory Visit (INDEPENDENT_AMBULATORY_CARE_PROVIDER_SITE_OTHER): Payer: Medicare Other

## 2018-01-17 VITALS — BP 140/72 | HR 80 | Temp 97.9°F | Ht 69.0 in | Wt 194.8 lb

## 2018-01-17 DIAGNOSIS — Z0000 Encounter for general adult medical examination without abnormal findings: Secondary | ICD-10-CM

## 2018-01-17 DIAGNOSIS — E538 Deficiency of other specified B group vitamins: Secondary | ICD-10-CM

## 2018-01-17 MED ORDER — CYANOCOBALAMIN 1000 MCG/ML IJ SOLN
1000.0000 ug | Freq: Once | INTRAMUSCULAR | Status: AC
  Administered 2018-01-17: 1000 ug via INTRAMUSCULAR

## 2018-01-17 NOTE — Progress Notes (Signed)
Subjective:   Shannon Grimes is a 67 y.o. female who presents for an Initial Medicare Annual Wellness Visit.  Review of Systems    N/A  Cardiac Risk Factors include: advanced age (>44men, >17 women);dyslipidemia;hypertension     Objective:    Today's Vitals   01/17/18 1004 01/17/18 1010  BP: (!) 148/78 140/72  Pulse: 80   Temp: 97.9 F (36.6 C)   TempSrc: Oral   Weight: 194 lb 12.8 oz (88.4 kg)   Height: 5\' 9"  (1.753 m)   PainSc: 4  4    Body mass index is 28.77 kg/m.  Advanced Directives 01/17/2018 10/16/2017  Does Patient Have a Medical Advance Directive? Yes Yes  Type of Advance Directive Living will;Healthcare Power of [place];Living will  Copy of [place] in Chart? No - copy requested -    Current Medications (verified) Outpatient Encounter Medications as of 01/17/2018  Medication Sig  . cyanocobalamin (,VITAMIN B-12,) 1000 MCG/ML injection INJECT 1 ML AS DIRECTED EVERY 30 (THIRTY) DAYS.  Marland Kitchen diclofenac sodium (VOLTAREN) 1 % GEL Voltaren 1 % topical gel  APPLY 4 GRAM TO THE AFFECTED AREA(S) BY TOPICAL ROUTE AS NEEDED  . estradiol (ESTRACE) 2 MG tablet Take 1 tablet (2 mg total) by mouth daily.  Marland Kitchen gabapentin (NEURONTIN) 400 MG capsule Take 1 capsule (400 mg total) by mouth 2 (two) times daily. (Patient taking differently: Take 400 mg by mouth 3 (three) times daily. )  . hydrochlorothiazide (HYDRODIURIL) 25 MG tablet Take 1 tablet (25 mg total) by mouth daily.  . nortriptyline (PAMELOR) 25 MG capsule Take 1 capsule (25 mg total) by mouth at bedtime.  Marland Kitchen oxyCODONE-acetaminophen (PERCOCET) 10-325 MG tablet Take 1 tablet by mouth every 6 (six) hours as needed.  . OXYCONTIN 20 MG 12 hr tablet Take 20 mg by mouth every 12 (twelve) hours.  . promethazine (PHENERGAN) 25 MG tablet Take 1 tablet by mouth every 4 (four) hours as needed.  . SUMAtriptan (IMITREX) 100 MG tablet Take 1 tablet by mouth daily as needed.  Marland Kitchen tiZANidine  (ZANAFLEX) 4 MG tablet Take 1 tablet (4 mg total) by mouth 2 (two) times daily.   No facility-administered encounter medications on file as of 01/17/2018.     Allergies (verified) Morphine; Pentazocine; and Statins   History: Past Medical History:  Diagnosis Date  . Allergy   . Anemia    pernicious  . Blood transfusion without reported diagnosis    after hysterectomy  . GERD (gastroesophageal reflux disease)   . Neuromuscular disorder ([place])    multiple ortho issues with abnormal EMGs  . Osteoarthritis    Past Surgical History:  Procedure Laterality Date  . ANKLE FRACTURE SURGERY  1994   screws removed in 2007  . ANKLE FUSION Right 2006  . APPENDECTOMY  1984  . CHOLECYSTECTOMY  1994  . Hip abductor attachment Right 2013  . HIP FRACTURE SURGERY  2001  . ILIOTIBIAL BAND RELEASE  2016  . LUMBAR FUSION  1999   L5-S1 fusion  . LUMBAR FUSION  2004   L3-L4 fusion  . LUMBAR FUSION  2016   L2-L5, needed revision of previous surgeries  . SACROILIAC JOINT FUSION Right 2012  . STEROID INJECTION TO SCAR    . TOTAL ABDOMINAL HYSTERECTOMY W/ BILATERAL SALPINGOOPHORECTOMY  1984   endometriosis  . TOTAL HIP ARTHROPLASTY Right 2009   Family History  Problem Relation Age of Onset  . Stroke Mother 30  . Congestive Heart  Failure Mother   . Heart disease Father 71  . Colon cancer Sister 59  . Kidney failure Sister        chronic  . Liver disease Sister        end stage  . Colon cancer Brother 76  . Mitral valve prolapse Sister   . Breast cancer Neg Hx    Social History   Socioeconomic History  . Marital status: Married    Spouse name: Collier Salina  . Number of children: 2  . Years of education: bachelor's  . Highest education level: Bachelor's degree (e.g., BA, AB, BS)  Social Needs  . Financial resource strain: Not hard at all  . Food insecurity - worry: Never true  . Food insecurity - inability: Never true  . Transportation needs - medical: No  . Transportation needs -  non-medical: No  Occupational History  . Occupation: Retired    Comment: Education officer, museum  Tobacco Use  . Smoking status: Former Smoker    Years: 10.00    Types: Cigarettes    Last attempt to quit: 01/23/1983    Years since quitting: 35.0  . Smokeless tobacco: Never Used  . Tobacco comment: was social smoker, not everyday  Substance and Sexual Activity  . Alcohol use: No  . Drug use: No  . Sexual activity: Yes    Birth control/protection: Surgical  Other Topics Concern  . None  Social History Narrative  . None    Tobacco Counseling Counseling given: Not Answered Comment: was social smoker, not everyday   Clinical Intake:  Pre-visit preparation completed: Yes  Pain : 0-10 Pain Score: 4  Pain Type: Chronic pain Pain Location: Hip(right hip and lower back pain) Pain Descriptors / Indicators: Aching, Throbbing, Sharp Pain Frequency: Constant     Nutritional Status: BMI 25 -29 Overweight Nutritional Risks: Nausea/ vomitting/ diarrhea(Nausea due to pancreatitis in past. ) Diabetes: No  How often do you need to have someone help you when you read instructions, pamphlets, or other written materials from your doctor or pharmacy?: 1 - Never  Interpreter Needed?: No  Information entered by :: [facility], LPN   Activities of Daily Living In your present state of health, do you have any difficulty performing the following activities: 01/17/2018 10/16/2017  Hearing? N N  Vision? N Y  Difficulty concentrating or making decisions? N N  Walking or climbing stairs? Y Y  Comment due to right leg pain -  Dressing or bathing? N Y  Doing errands, shopping? N Y  Conservation officer, nature and eating ? N -  Using the Toilet? N -  In the past six months, have you accidently leaked urine? N -  Do you have problems with loss of bowel control? N -  Managing your Medications? N -  Managing your Finances? N -  Housekeeping or managing your Housekeeping? N -  Some recent data might be hidden      Immunizations and Health Maintenance Immunization History  Administered Date(s) Administered  . Influenza, High Dose Seasonal PF 10/24/2017  . Pneumococcal Conjugate-13 07/06/2016  . Pneumococcal Polysaccharide-23 11/19/2017  . Zoster 11/21/2011   Health Maintenance Due  Topic Date Due  . Samul Dada  03/24/1970    Patient Care Team: Virginia Crews, MD as PCP - General (Family Medicine) Nadene Rubins, DO as Referring Physician (Physical Medicine and Rehabilitation) Smith Mince, MD as Referring Physician (Orthopedic Surgery)  Indicate any recent Medical Services you may have received from other than [facility] providers in the  past year (date may be approximate).     Assessment:   This is a routine wellness examination for [facility].  Hearing/Vision screen Vision Screening Comments: Pt has regular vision checks yearly. Pt moved here in 06/2017 so she has not set up an eye exam yet, but plans to set up apt this year.   Dietary issues and exercise activities discussed: Current Exercise Habits: Home exercise routine, Type of exercise: walking, Time (Minutes): 25, Frequency (Times/Week): 7(weather permitting), Weekly Exercise (Minutes/Week): 175, Intensity: Mild, Exercise limited by: orthopedic condition(s)  Goals    . DIET - REDUCE SUGAR INTAKE     Recommend cutting back on desserts and sugars in daily diet. Pt is cutting back to eating 3 sweets a week.       Depression Screen PHQ 2/9 Scores 01/17/2018 10/16/2017  PHQ - 2 Score 0 0    Fall Risk Fall Risk  01/17/2018 10/16/2017  Falls in the past year? No Yes  Number falls in past yr: - 1  Injury with Fall? - No  Follow up - Falls evaluation completed    Is the patient's home free of loose throw rugs in walkways, pet beds, electrical cords, etc?   yes      Grab bars in the bathroom? yes      Handrails on the stairs?   n/a      Adequate lighting?   yes  Timed Get Up and Go Performed N/A  Cognitive  Function: Pt declined screening today.      Screening Tests Health Maintenance  Topic Date Due  . TETANUS/TDAP  03[birth year]24/1970  . MAMMOGRAM  07/10/2018  . COLONOSCOPY  09/27/2025  . INFLUENZA VACCINE  Completed  . DEXA SCAN  Completed  . Hepatitis C Screening  Completed  . PNA vac Low Risk Adult  Completed    Qualifies for Shingles Vaccine? Pt declines today. Pt states she will receive this at her next OV with Dr B.  Cancer Screenings: Lung: Low Dose CT Chest recommended if Age 48-80 years, 30 pack-year currently smoking OR have quit w/in 15years. Patient does not qualify. Breast: Up to date on Mammogram? Yes   Up to date of Bone Density/Dexa? Yes Colorectal: Up to date  Additional Screenings:  Hepatitis B/HIV/Syphillis: HIV up to date. Pt declines other lab work today.  Hepatitis C Screening: Up to date    Plan:  I have personally reviewed and addressed the Medicare Annual Wellness questionnaire and have noted the following in the patient's chart:  A. Medical and social history B. Use of alcohol, tobacco or illicit drugs  C. Current medications and supplements D. Functional ability and status E.  Nutritional status F.  Physical activity G. Advance directives H. List of other physicians I.  Hospitalizations, surgeries, and ER visits in previous 12 months J.  [place] such as hearing and vision if needed, cognitive and depression L. Referrals and appointments - none  In addition, I have reviewed and discussed with patient certain preventive protocols, quality metrics, and best practice recommendations. A written personalized care plan for preventive services as well as general preventive health recommendations were provided to patient.  See attached scanned questionnaire for additional information.   Signed,  Fabio Neighbors, LPN Nurse Health Advisor   Nurse Recommendations: Pt declined the tetanus and Shingrix vaccine today. Pt would like to receive these  at next OV with PCP in March 2019.

## 2018-01-17 NOTE — Patient Instructions (Addendum)
Shannon Grimes , Thank you for taking time to come for your Medicare Wellness Visit. I appreciate your ongoing commitment to your health goals. Please review the following plan we discussed and let me know if I can assist you in the future.   Screening recommendations/referrals: Colonoscopy: Up to date Mammogram: Up to date Bone Density: Up to date Recommended yearly ophthalmology/optometry visit for glaucoma screening and checkup Recommended yearly dental visit for hygiene and checkup  Vaccinations: Influenza vaccine: Up to date Pneumococcal vaccine: Up to date Tdap vaccine: Pt declines today.  Shingles vaccine: Pt declines today.     Advanced directives: Scanned into chart today.   Conditions/risks identified: Recommend cutting back on desserts and sugars in daily diet. Pt is cutting back to eating 3 sweets a week.   Next appointment: 03/20/18   Preventive Care 65 Years and Older, Female Preventive care refers to lifestyle choices and visits with your health care provider that can promote health and wellness. What does preventive care include?  A yearly physical exam. This is also called an annual well check.  Dental exams once or twice a year.  Routine eye exams. Ask your health care provider how often you should have your eyes checked.  Personal lifestyle choices, including:  Daily care of your teeth and gums.  Regular physical activity.  Eating a healthy diet.  Avoiding tobacco and drug use.  Limiting alcohol use.  Practicing safe sex.  Taking low-dose aspirin every day.  Taking vitamin and mineral supplements as recommended by your health care provider. What happens during an annual well check? The services and screenings done by your health care provider during your annual well check will depend on your age, overall health, lifestyle risk factors, and family history of disease. Counseling  Your health care provider may ask you questions about your:  Alcohol  use.  Tobacco use.  Drug use.  Emotional well-being.  Home and relationship well-being.  Sexual activity.  Eating habits.  History of falls.  Memory and ability to understand (cognition).  Work and work Statistician.  Reproductive health. Screening  You may have the following tests or measurements:  Height, weight, and BMI.  Blood pressure.  Lipid and cholesterol levels. These may be checked every 5 years, or more frequently if you are over 62 years old.  Skin check.  Lung cancer screening. You may have this screening every year starting at age 72 if you have a 30-pack-year history of smoking and currently smoke or have quit within the past 15 years.  Fecal occult blood test (FOBT) of the stool. You may have this test every year starting at age 74.  Flexible sigmoidoscopy or colonoscopy. You may have a sigmoidoscopy every 5 years or a colonoscopy every 10 years starting at age 109.  Hepatitis C blood test.  Hepatitis B blood test.  Sexually transmitted disease (STD) testing.  Diabetes screening. This is done by checking your blood sugar (glucose) after you have not eaten for a while (fasting). You may have this done every 1-3 years.  Bone density scan. This is done to screen for osteoporosis. You may have this done starting at age 12.  Mammogram. This may be done every 1-2 years. Talk to your health care provider about how often you should have regular mammograms. Talk with your health care provider about your test results, treatment options, and if necessary, the need for more tests. Vaccines  Your health care provider may recommend certain vaccines, such as:  Influenza vaccine.  This is recommended every year.  Tetanus, diphtheria, and acellular pertussis (Tdap, Td) vaccine. You may need a Td booster every 10 years.  Zoster vaccine. You may need this after age 67.  Pneumococcal 13-valent conjugate (PCV13) vaccine. One dose is recommended after age  71.  Pneumococcal polysaccharide (PPSV23) vaccine. One dose is recommended after age 86. Talk to your health care provider about which screenings and vaccines you need and how often you need them. This information is not intended to replace advice given to you by your health care provider. Make sure you discuss any questions you have with your health care provider. Document Released: 01/06/2016 Document Revised: 08/29/2016 Document Reviewed: 10/11/2015 Elsevier Interactive Patient Education  2017 [place] Prevention in the Home Falls can cause injuries. They can happen to people of all ages. There are many things you can do to make your home safe and to help prevent falls. What can I do on the outside of my home?  Regularly fix the edges of walkways and driveways and fix any cracks.  Remove anything that might make you trip as you walk through a door, such as a raised step or threshold.  Trim any bushes or trees on the path to your home.  Use bright outdoor lighting.  Clear any walking paths of anything that might make someone trip, such as rocks or tools.  Regularly check to see if handrails are loose or broken. Make sure that both sides of any steps have handrails.  Any raised decks and porches should have guardrails on the edges.  Have any leaves, snow, or ice cleared regularly.  Use sand or salt on walking paths during winter.  Clean up any spills in your garage right away. This includes oil or grease spills. What can I do in the bathroom?  Use night lights.  Install grab bars by the toilet and in the tub and shower. Do not use towel bars as grab bars.  Use non-skid mats or decals in the tub or shower.  If you need to sit down in the shower, use a plastic, non-slip stool.  Keep the floor dry. Clean up any water that spills on the floor as soon as it happens.  Remove soap buildup in the tub or shower regularly.  Attach bath mats securely with double-sided  non-slip rug tape.  Do not have throw rugs and other things on the floor that can make you trip. What can I do in the bedroom?  Use night lights.  Make sure that you have a light by your bed that is easy to reach.  Do not use any sheets or blankets that are too big for your bed. They should not hang down onto the floor.  Have a firm chair that has side arms. You can use this for support while you get dressed.  Do not have throw rugs and other things on the floor that can make you trip. What can I do in the kitchen?  Clean up any spills right away.  Avoid walking on wet floors.  Keep items that you use a lot in easy-to-reach places.  If you need to reach something above you, use a strong step stool that has a grab bar.  Keep electrical cords out of the way.  Do not use floor polish or wax that makes floors slippery. If you must use wax, use non-skid floor wax.  Do not have throw rugs and other things on the floor that can make  you trip. What can I do with my stairs?  Do not leave any items on the stairs.  Make sure that there are handrails on both sides of the stairs and use them. Fix handrails that are broken or loose. Make sure that handrails are as long as the stairways.  Check any carpeting to make sure that it is firmly attached to the stairs. Fix any carpet that is loose or worn.  Avoid having throw rugs at the top or bottom of the stairs. If you do have throw rugs, attach them to the floor with carpet tape.  Make sure that you have a light switch at the top of the stairs and the bottom of the stairs. If you do not have them, ask someone to add them for you. What else can I do to help prevent falls?  Wear shoes that:  Do not have high heels.  Have rubber bottoms.  Are comfortable and fit you well.  Are closed at the toe. Do not wear sandals.  If you use a stepladder:  Make sure that it is fully opened. Do not climb a closed stepladder.  Make sure that both  sides of the stepladder are locked into place.  Ask someone to hold it for you, if possible.  Clearly mark and make sure that you can see:  Any grab bars or handrails.  First and last steps.  Where the edge of each step is.  Use tools that help you move around (mobility aids) if they are needed. These include:  Canes.  Walkers.  Scooters.  Crutches.  Turn on the lights when you go into a dark area. Replace any light bulbs as soon as they burn out.  Set up your furniture so you have a clear path. Avoid moving your furniture around.  If any of your floors are uneven, fix them.  If there are any pets around you, be aware of where they are.  Review your medicines with your doctor. Some medicines can make you feel dizzy. This can increase your chance of falling. Ask your doctor what other things that you can do to help prevent falls. This information is not intended to replace advice given to you by your health care provider. Make sure you discuss any questions you have with your health care provider. Document Released: 10/06/2009 Document Revised: 05/17/2016 Document Reviewed: 01/14/2015 Elsevier Interactive Patient Education  2017 [employer].

## 2018-01-22 ENCOUNTER — Encounter: Payer: Self-pay | Admitting: Family Medicine

## 2018-01-23 DIAGNOSIS — H25813 Combined forms of age-related cataract, bilateral: Secondary | ICD-10-CM | POA: Diagnosis not present

## 2018-01-23 DIAGNOSIS — H40003 Preglaucoma, unspecified, bilateral: Secondary | ICD-10-CM | POA: Diagnosis not present

## 2018-01-23 DIAGNOSIS — H5211 Myopia, right eye: Secondary | ICD-10-CM | POA: Diagnosis not present

## 2018-01-30 DIAGNOSIS — D485 Neoplasm of uncertain behavior of skin: Secondary | ICD-10-CM | POA: Diagnosis not present

## 2018-02-11 DIAGNOSIS — Z96641 Presence of right artificial hip joint: Secondary | ICD-10-CM | POA: Diagnosis not present

## 2018-02-11 DIAGNOSIS — T84328A Displacement of other bone devices, implants and grafts, initial encounter: Secondary | ICD-10-CM | POA: Diagnosis not present

## 2018-02-13 DIAGNOSIS — Z96641 Presence of right artificial hip joint: Secondary | ICD-10-CM | POA: Diagnosis not present

## 2018-02-17 ENCOUNTER — Encounter: Payer: Self-pay | Admitting: Family Medicine

## 2018-02-19 ENCOUNTER — Ambulatory Visit (INDEPENDENT_AMBULATORY_CARE_PROVIDER_SITE_OTHER): Payer: Medicare Other

## 2018-02-19 DIAGNOSIS — M25551 Pain in right hip: Secondary | ICD-10-CM | POA: Diagnosis not present

## 2018-02-19 DIAGNOSIS — E538 Deficiency of other specified B group vitamins: Secondary | ICD-10-CM | POA: Diagnosis not present

## 2018-02-19 DIAGNOSIS — T84328A Displacement of other bone devices, implants and grafts, initial encounter: Secondary | ICD-10-CM | POA: Diagnosis not present

## 2018-02-19 DIAGNOSIS — Z96641 Presence of right artificial hip joint: Secondary | ICD-10-CM | POA: Diagnosis not present

## 2018-02-19 MED ORDER — CYANOCOBALAMIN 1000 MCG/ML IJ SOLN
1000.0000 ug | Freq: Once | INTRAMUSCULAR | Status: AC
Start: 2018-02-19 — End: 2018-02-19
  Administered 2018-02-19: 1000 ug via INTRAMUSCULAR

## 2018-02-21 DIAGNOSIS — Z96641 Presence of right artificial hip joint: Secondary | ICD-10-CM | POA: Diagnosis not present

## 2018-02-21 DIAGNOSIS — M25551 Pain in right hip: Secondary | ICD-10-CM | POA: Diagnosis not present

## 2018-02-26 DIAGNOSIS — Z96641 Presence of right artificial hip joint: Secondary | ICD-10-CM | POA: Diagnosis not present

## 2018-02-26 DIAGNOSIS — T84050D Periprosthetic osteolysis of internal prosthetic right hip joint, subsequent encounter: Secondary | ICD-10-CM | POA: Diagnosis not present

## 2018-02-26 DIAGNOSIS — T84328A Displacement of other bone devices, implants and grafts, initial encounter: Secondary | ICD-10-CM | POA: Diagnosis not present

## 2018-02-28 ENCOUNTER — Telehealth: Payer: Self-pay | Admitting: Family Medicine

## 2018-02-28 NOTE — Telephone Encounter (Addendum)
Received [facility] Medical Clearance form. The form is placed in providers box. Thanks CC  Form was faxed to [facility] on 03/25/18

## 2018-03-03 NOTE — Telephone Encounter (Signed)
Will need pre-op eval OV.  Virginia Crews, MD, MPH [facility] 03/03/2018 11:53 AM

## 2018-03-04 ENCOUNTER — Telehealth: Payer: Self-pay | Admitting: Family Medicine

## 2018-03-04 NOTE — Telephone Encounter (Signed)
Patient returning Shannon Grimes's call. Patient states she will be coming in to see Dr. Brita Romp March 29th @ 9:45 for F/U and B-12 shot and would like to see if she can do her pre-op then, if not pre-op has to be done after April 1St. Please advise patient. Thanks CC

## 2018-03-04 NOTE — Telephone Encounter (Signed)
Asked pt of we could change this to a 40 minute slot. She asked if it was okay to do the FU, b12 injection and pre-op in same OV? She asked if it would be ok to code all of these together. Please advise.

## 2018-03-04 NOTE — Telephone Encounter (Signed)
lmtcb to schedule

## 2018-03-05 NOTE — Telephone Encounter (Signed)
We can do this, but would need a 40 min appt.  We'd have to move the appt because there is someone scheduled after her, so earlier in the morning or in the afternoon would work better.  If patient is ok with this, so am I.  Thanks!  Virginia Crews, MD, MPH [facility] 03/05/2018 8:34 AM

## 2018-03-05 NOTE — Telephone Encounter (Signed)
Spoke with patient and advised, appt changed to 8:00 for 63min. KW

## 2018-03-06 DIAGNOSIS — M47816 Spondylosis without myelopathy or radiculopathy, lumbar region: Secondary | ICD-10-CM | POA: Diagnosis not present

## 2018-03-19 ENCOUNTER — Telehealth: Payer: Self-pay | Admitting: Family Medicine

## 2018-03-19 ENCOUNTER — Ambulatory Visit: Payer: Self-pay

## 2018-03-19 NOTE — Telephone Encounter (Signed)
Shannon Grimes with [facility] called saying they have faxed to have pt have a medical clearance for surgery.  The surgery is scheduled for May 14th  [facility] # is [phone]  Thanks Con Memos

## 2018-03-19 NOTE — Telephone Encounter (Signed)
Fax is on my desk and pt has appointment scheduled for 03/21/2018.

## 2018-03-20 ENCOUNTER — Ambulatory Visit: Payer: Medicare Other | Admitting: Family Medicine

## 2018-03-21 ENCOUNTER — Ambulatory Visit: Payer: Medicare Other | Admitting: Family Medicine

## 2018-03-21 ENCOUNTER — Encounter: Payer: Self-pay | Admitting: Family Medicine

## 2018-03-21 ENCOUNTER — Ambulatory Visit (INDEPENDENT_AMBULATORY_CARE_PROVIDER_SITE_OTHER): Payer: Medicare Other | Admitting: Family Medicine

## 2018-03-21 VITALS — BP 162/82 | HR 84 | Temp 97.8°F | Resp 16 | Wt 189.0 lb

## 2018-03-21 DIAGNOSIS — R739 Hyperglycemia, unspecified: Secondary | ICD-10-CM | POA: Diagnosis not present

## 2018-03-21 DIAGNOSIS — G43109 Migraine with aura, not intractable, without status migrainosus: Secondary | ICD-10-CM | POA: Diagnosis not present

## 2018-03-21 DIAGNOSIS — Z01818 Encounter for other preprocedural examination: Secondary | ICD-10-CM

## 2018-03-21 DIAGNOSIS — D510 Vitamin B12 deficiency anemia due to intrinsic factor deficiency: Secondary | ICD-10-CM | POA: Diagnosis not present

## 2018-03-21 DIAGNOSIS — I10 Essential (primary) hypertension: Secondary | ICD-10-CM | POA: Diagnosis not present

## 2018-03-21 LAB — POCT URINALYSIS DIPSTICK
BILIRUBIN UA: NEGATIVE
GLUCOSE UA: NEGATIVE
Ketones, UA: NEGATIVE
Leukocytes, UA: NEGATIVE
Nitrite, UA: NEGATIVE
Protein, UA: NEGATIVE
RBC UA: NEGATIVE
Spec Grav, UA: 1.01 (ref 1.010–1.025)
UROBILINOGEN UA: 0.2 U/dL
pH, UA: 6 (ref 5.0–8.0)

## 2018-03-21 MED ORDER — CYANOCOBALAMIN 1000 MCG/ML IJ SOLN
1000.0000 ug | Freq: Once | INTRAMUSCULAR | Status: AC
  Administered 2018-03-21: 1000 ug via INTRAMUSCULAR

## 2018-03-21 MED ORDER — NORTRIPTYLINE HCL 50 MG PO CAPS: 50.0000 mg | ORAL_CAPSULE | Freq: Every day | ORAL | 2 refills | Status: DC

## 2018-03-21 MED ORDER — LISINOPRIL 10 MG PO TABS: 10.0000 mg | ORAL_TABLET | Freq: Every day | ORAL | 1 refills | Status: DC

## 2018-03-21 NOTE — Progress Notes (Signed)
Patient: Shannon Grimes Female    DOB: 04-24-1951   67 y.o.   MRN: [id number] Visit Date: 03/21/2018  Today's Provider: [name], MD   I, Martha Clan, CMA, am acting as scribe for [name], MD.  Chief Complaint  Patient presents with  . Pre-op Exam  . Hypertension  . Migraine   Subjective:    HPI     Follow up for Migraines  The patient was last seen for this 3 months ago. Changes made at last visit include continuing nortriptyline 25 mg qhs, and sumatriptan PRN.  She reports good compliance with treatment. She feels that condition is Unchanged. Pt states she is still not sleeping well. She is not having side effects. Initially with decreased frequency and intensity of migraines. Intensity has worsened again but remain less frequent.   ------------------------------------------------------------------------------------   Hypertension, follow-up:  BP Readings from Last 3 Encounters:  03/21/18 (!) 162/82  01/17/18 140/72  12/20/17 130/82    She was last seen for hypertension 3 months ago.  BP at that visit was 130/82. Management since that visit includes continuing HCTZ. She reports good compliance with treatment. She is not having side effects.  She is not exercising. Due to hip pain. She is adherent to low salt diet.   Outside blood pressures are elevated. She is experiencing near-syncope. Pt believes this is coming from pancreatitis. She is experiencing upper abdominal pain, nausea, and vomiting.   Patient denies chest pain, chest pressure/discomfort, claudication, dyspnea, exertional chest pressure/discomfort, fatigue, irregular heart beat, lower extremity edema, orthopnea and palpitations.   Cardiovascular risk factors include advanced age (older than 64 for men, 88 for women) and hypertension.   Weight trend: increasing steadily Wt Readings from Last 3 Encounters:  03/21/18 189 lb (85.7 kg)  01/17/18 194 lb 12.8 oz (88.4 kg)    12/20/17 191 lb (86.6 kg)    Current diet: in general, a "healthy" diet    ------------------------------------------------------------------------ Pre-Operative Exam Pt is going to have a right total hip revision that is scheduled for 05/06/2018. The surgeon is going to be Dr. Gerrit Heck with [facility].   Pt is a 67 y.o. female who is here for preoperative clearance for R total hip revision  1) High Risk Cardiac Conditions  1) Recent MI - No.  2) Decompensated Heart Failure - No.  3) Unstable angina - No.  4) Symptomatic arrythmia - No.  5) Sx Valvular Disease - No.  2) Intermediate Risk Factors - DM, CKD, CVA, CHF, CAD - No.  2) Functional Status - > 4 mets (Walk, run, climb stairs) Yes.  Rob Hickman Activity Status Index: 44.7, 8.23 mets  3) Surgery Specific Risk - Intermediate (Carotid, Head and Neck, Orthopaedic )  4) Further Noninvasive evaluation -   1) EKG - No.   1) Hx of CVA, CAD, DM, CKD  2) Echo - No.   1) Worsening dyspnea   3) Stress Testing - Active Cardiac Disease - No.  5) Need for medical therapy - Beta Blocker, Statins indicated ? No.    Allergies  Allergen Reactions  . Pentazocine Anaphylaxis  . Statins Other (See Comments)    pancreatitis  . Morphine Nausea And Vomiting    And migraine     Current Outpatient Medications:  .  cyanocobalamin (,VITAMIN B-12,) 1000 MCG/ML injection, INJECT 1 ML AS DIRECTED EVERY 30 (THIRTY) DAYS., Disp: , Rfl: 11 .  diclofenac sodium (VOLTAREN) 1 % GEL, Voltaren 1 % topical  gel  APPLY 4 GRAM TO THE AFFECTED AREA(S) BY TOPICAL ROUTE AS NEEDED, Disp: , Rfl:  .  estradiol (ESTRACE) 2 MG tablet, Take 1 tablet (2 mg total) by mouth daily., Disp: 90 tablet, Rfl: 2 .  gabapentin (NEURONTIN) 400 MG capsule, Take 1 capsule (400 mg total) by mouth 2 (two) times daily. (Patient taking differently: Take 400 mg by mouth 3 (three) times daily. ), Disp: 180 capsule, Rfl: 2 .  hydrochlorothiazide (HYDRODIURIL) 25 MG tablet, Take 1  tablet (25 mg total) by mouth daily. (Patient taking differently: Take by mouth daily. ), Disp: 90 tablet, Rfl: 1 .  nortriptyline (PAMELOR) 50 MG capsule, Take 1 capsule (50 mg total) by mouth at bedtime., Disp: 30 capsule, Rfl: 2 .  oxyCODONE-acetaminophen (PERCOCET) 10-325 MG tablet, Take 1 tablet by mouth every 6 (six) hours as needed., Disp: , Rfl: 0 .  OXYCONTIN 20 MG 12 hr tablet, Take 20 mg by mouth every 12 (twelve) hours., Disp: , Rfl: 0 .  promethazine (PHENERGAN) 25 MG tablet, Take 1 tablet by mouth every 4 (four) hours as needed., Disp: , Rfl:  .  SUMAtriptan (IMITREX) 100 MG tablet, Take 1 tablet by mouth daily as needed., Disp: , Rfl:  .  tiZANidine (ZANAFLEX) 4 MG tablet, Take 1 tablet (4 mg total) by mouth 2 (two) times daily., Disp: 180 tablet, Rfl: 2 .  lisinopril (PRINIVIL,ZESTRIL) 10 MG tablet, Take 1 tablet (10 mg total) by mouth daily., Disp: 30 tablet, Rfl: 1  Review of Systems  Constitutional: Negative for activity change, appetite change, chills, diaphoresis, fatigue, fever and unexpected weight change.  Respiratory: Negative for cough and shortness of breath.   Cardiovascular: Negative for chest pain, palpitations and leg swelling.  Gastrointestinal: Positive for abdominal pain, nausea and vomiting.  Neurological: Positive for headaches.    Social History   Tobacco Use  . Smoking status: Former Smoker    Years: 10.00    Types: Cigarettes    Last attempt to quit: 01/23/1983    Years since quitting: 35.1  . Smokeless tobacco: Never Used  . Tobacco comment: was social smoker, not everyday  Substance Use Topics  . Alcohol use: No   Objective:   BP (!) 162/82 (BP Location: Left Arm, Patient Position: Sitting, Cuff Size: Large)   Pulse 84   Temp 97.8 F (36.6 C) (Oral)   Resp 16   Wt 189 lb (85.7 kg)   SpO2 98%   BMI 27.91 kg/m  Vitals:   03/21/18 0813  BP: (!) 162/82  Pulse: 84  Resp: 16  Temp: 97.8 F (36.6 C)  TempSrc: Oral  SpO2: 98%  Weight:  189 lb (85.7 kg)     Physical Exam  Constitutional: She appears well-developed and well-nourished. No distress.  HENT:  Head: Normocephalic and atraumatic.  Eyes: Conjunctivae are normal. No scleral icterus.  Cardiovascular: Normal rate, regular rhythm, normal heart sounds and intact distal pulses.  No murmur heard. Pulmonary/Chest: Effort normal and breath sounds normal. No respiratory distress. She has no wheezes. She has no rales.  Musculoskeletal: She exhibits no edema.  Neurological: She is alert.  Skin: Skin is warm and dry. No rash noted.  Psychiatric: She has a normal mood and affect. Her behavior is normal.  Vitals reviewed.      Assessment & Plan:      Problem List Items Addressed This Visit      Cardiovascular and Mediastinum   Migraines    Uncontrolled Increase nortriptyline to 50mg   qhs Continue Imitrex prn      Relevant Medications   lisinopril (PRINIVIL,ZESTRIL) 10 MG tablet   nortriptyline (PAMELOR) 50 MG capsule   Hypertension    Uncontrolled currently and in recent home readings Continue HCTZ Add lisinopril 10mg  daily Patient to call in 1 month with BP readings      Relevant Medications   lisinopril (PRINIVIL,ZESTRIL) 10 MG tablet     Other   Pernicious anemia    Continue B12 injections monthly      Relevant Medications   cyanocobalamin ((VITAMIN B-12)) injection 1,000 mcg (Completed)    Other Visit Diagnoses    Preoperative examination    -  Primary   Relevant Orders   EKG 12-Lead (Completed)   CBC   Comprehensive metabolic panel   Hemoglobin A1c   POCT urinalysis dipstick (Completed)   Hyperglycemia       Relevant Orders   Hemoglobin A1c      I have independently evaluated patient.  Raeonna Milo is a 67 y.o. female who is low risk for a moderate risk surgery.  There are not modifiable risk factors (smoking, etc). Lynnae Riojas's RCRI/NSQIP calculation for MACE is: 6.2%.   - EKG stable and unchanged from previous - labs  pending - hold lisinopril day of surgery - not on any aspirin or blood thinners   Return in about 3 months (around 06/21/2018) for HTN, migraines f/u.   The entirety of the information documented in the History of Present Illness, Review of Systems and Physical Exam were personally obtained by me. Portions of this information were initially documented by Raquel Sarna Ratchford, CMA and reviewed by me for thoroughness and accuracy.    Virginia Crews, MD, MPH [facility] 03/21/2018 9:47 AM

## 2018-03-21 NOTE — Assessment & Plan Note (Signed)
Uncontrolled Increase nortriptyline to 50mg  qhs Continue Imitrex prn

## 2018-03-21 NOTE — Assessment & Plan Note (Signed)
Uncontrolled currently and in recent home readings Continue HCTZ Add lisinopril 10mg  daily Patient to call in 1 month with BP readings

## 2018-03-21 NOTE — Assessment & Plan Note (Signed)
Continue B12 injections monthly

## 2018-03-22 LAB — COMPREHENSIVE METABOLIC PANEL
ALT: 25 IU/L (ref 0–32)
AST: 33 IU/L (ref 0–40)
Albumin/Globulin Ratio: 1.4 (ref 1.2–2.2)
Albumin: 4.3 g/dL (ref 3.6–4.8)
Alkaline Phosphatase: 78 IU/L (ref 39–117)
BUN/Creatinine Ratio: 22 (ref 12–28)
BUN: 19 mg/dL (ref 8–27)
Bilirubin Total: 0.4 mg/dL (ref 0.0–1.2)
CALCIUM: 9.3 mg/dL (ref 8.7–10.3)
CO2: 22 mmol/L (ref 20–29)
CREATININE: 0.86 mg/dL (ref 0.57–1.00)
Chloride: 97 mmol/L (ref 96–106)
GFR, EST AFRICAN AMERICAN: 81 mL/min/{1.73_m2} (ref >59)
GFR, EST NON AFRICAN AMERICAN: 71 mL/min/{1.73_m2} (ref >59)
GLUCOSE: 88 mg/dL (ref 65–99)
Globulin, Total: 3 g/dL (ref 1.5–4.5)
Potassium: 3.7 mmol/L (ref 3.5–5.2)
Sodium: 139 mmol/L (ref 134–144)
TOTAL PROTEIN: 7.3 g/dL (ref 6.0–8.5)

## 2018-03-22 LAB — CBC
Hematocrit: 41.3 % (ref 34.0–46.6)
Hemoglobin: 13.9 g/dL (ref 11.1–15.9)
MCH: 29.8 pg (ref 26.6–33.0)
MCHC: 33.7 g/dL (ref 31.5–35.7)
MCV: 88 fL (ref 79–97)
PLATELETS: 232 10*3/uL (ref 150–379)
RBC: 4.67 x10E6/uL (ref 3.77–5.28)
RDW: 14.2 % (ref 12.3–15.4)
WBC: 8 10*3/uL (ref 3.4–10.8)

## 2018-03-22 LAB — HEMOGLOBIN A1C
ESTIMATED AVERAGE GLUCOSE: 117 mg/dL
HEMOGLOBIN A1C: 5.7 % — AB (ref 4.8–5.6)

## 2018-03-24 ENCOUNTER — Telehealth: Payer: Self-pay

## 2018-03-24 NOTE — Telephone Encounter (Signed)
Pt advised and acknowledges understanding.  

## 2018-03-24 NOTE — Telephone Encounter (Signed)
-----   Message from Virginia Crews, MD sent at 03/24/2018  9:02 AM EDT ----- Normal Blood counts, kidney function, liver function, electrolytes.  Hemoglobin A1c (3 month average blood sugar) is in the pre-diabetes range.  Recommend low carb diet to ensure it doesn't progress to diabetes

## 2018-03-26 DIAGNOSIS — M47816 Spondylosis without myelopathy or radiculopathy, lumbar region: Secondary | ICD-10-CM | POA: Diagnosis not present

## 2018-03-27 NOTE — Telephone Encounter (Signed)
complete

## 2018-03-31 ENCOUNTER — Telehealth: Payer: Self-pay | Admitting: Family Medicine

## 2018-03-31 NOTE — Telephone Encounter (Signed)
Faxed [phone]- ROI to GI- Dr Zadie Rhine on 10.30.18

## 2018-03-31 NOTE — Telephone Encounter (Signed)
ROI faxed to Dr Darcel Bayley - [facility] on 10.30.18

## 2018-04-02 ENCOUNTER — Other Ambulatory Visit: Payer: Self-pay | Admitting: Family Medicine

## 2018-04-09 ENCOUNTER — Telehealth: Payer: Self-pay | Admitting: Family Medicine

## 2018-04-09 MED ORDER — NORTRIPTYLINE HCL 50 MG PO CAPS: 50.0000 mg | ORAL_CAPSULE | Freq: Every day | ORAL | 2 refills | Status: DC

## 2018-04-09 NOTE — Telephone Encounter (Signed)
Rx sent  Virginia Crews, MD, MPH [facility] 04/09/2018 11:10 AM

## 2018-04-09 NOTE — Telephone Encounter (Signed)
[facility] pharmacy faxed a refill request for the following medication. Thanks CC  nortriptyline (PAMELOR) 50 MG capsule

## 2018-04-09 NOTE — Telephone Encounter (Signed)
LOV 03/21/2018.

## 2018-04-12 ENCOUNTER — Other Ambulatory Visit: Payer: Self-pay | Admitting: Family Medicine

## 2018-04-21 DIAGNOSIS — H40003 Preglaucoma, unspecified, bilateral: Secondary | ICD-10-CM | POA: Diagnosis not present

## 2018-04-23 DIAGNOSIS — G43909 Migraine, unspecified, not intractable, without status migrainosus: Secondary | ICD-10-CM | POA: Diagnosis not present

## 2018-04-23 DIAGNOSIS — K589 Irritable bowel syndrome without diarrhea: Secondary | ICD-10-CM | POA: Diagnosis not present

## 2018-04-23 DIAGNOSIS — T84090A Other mechanical complication of internal right hip prosthesis, initial encounter: Secondary | ICD-10-CM | POA: Diagnosis not present

## 2018-04-23 DIAGNOSIS — E7800 Pure hypercholesterolemia, unspecified: Secondary | ICD-10-CM | POA: Diagnosis not present

## 2018-04-23 DIAGNOSIS — I10 Essential (primary) hypertension: Secondary | ICD-10-CM | POA: Diagnosis not present

## 2018-04-23 DIAGNOSIS — K219 Gastro-esophageal reflux disease without esophagitis: Secondary | ICD-10-CM | POA: Diagnosis not present

## 2018-04-23 DIAGNOSIS — M797 Fibromyalgia: Secondary | ICD-10-CM | POA: Diagnosis not present

## 2018-04-24 ENCOUNTER — Ambulatory Visit (INDEPENDENT_AMBULATORY_CARE_PROVIDER_SITE_OTHER): Payer: Medicare Other | Admitting: Family Medicine

## 2018-04-24 ENCOUNTER — Encounter: Payer: Self-pay | Admitting: Family Medicine

## 2018-04-24 VITALS — BP 130/72 | HR 86 | Temp 97.6°F | Resp 16 | Wt 187.0 lb

## 2018-04-24 DIAGNOSIS — G43109 Migraine with aura, not intractable, without status migrainosus: Secondary | ICD-10-CM

## 2018-04-24 DIAGNOSIS — I10 Essential (primary) hypertension: Secondary | ICD-10-CM | POA: Diagnosis not present

## 2018-04-24 DIAGNOSIS — D510 Vitamin B12 deficiency anemia due to intrinsic factor deficiency: Secondary | ICD-10-CM | POA: Diagnosis not present

## 2018-04-24 MED ORDER — SUMATRIPTAN SUCCINATE 100 MG PO TABS: 100.0000 mg | ORAL_TABLET | Freq: Every day | ORAL | 5 refills | Status: DC | PRN

## 2018-04-24 MED ORDER — CYANOCOBALAMIN 1000 MCG/ML IJ SOLN
1000.0000 ug | Freq: Once | INTRAMUSCULAR | Status: AC
  Administered 2018-04-24: 1000 ug via INTRAMUSCULAR

## 2018-04-24 NOTE — Progress Notes (Signed)
Patient: Shannon Grimes Female    DOB: [birth year]   67 y.o.   MRN: [id number] Visit Date: 04/24/2018  Today's Provider: [name], MD   I, Martha Clan, CMA, am acting as scribe for [name], MD.  Chief Complaint  Patient presents with  . Hypertension  . Migraine   Subjective:    HPI      Hypertension, follow-up:  BP Readings from Last 3 Encounters:  04/24/18 130/72  03/21/18 (!) 162/82  01/17/18 140/72    She was last seen for hypertension 4 weeks ago.  BP at that visit was 162/82. Management since that visit includes adding lisinopril 10 mg. She reports good compliance with treatment. Outside blood pressures are 116-129/70's-80's. She is experiencing none.  Patient denies chest pain, dyspnea, exertional chest pressure/discomfort, irregular heart beat, lower extremity edema, near-syncope, palpitations and syncope.     Wt Readings from Last 3 Encounters:  04/24/18 187 lb (84.8 kg)  03/21/18 189 lb (85.7 kg)  01/17/18 194 lb 12.8 oz (88.4 kg)   ------------------------------------------------------------------------  Follow up for Migraines  The patient was last seen for this 4 weeks ago. Changes made at last visit include increasing Nortriptyline to 50 mg qhs. Pt needs a refill of Imitrex.  Few and mild migraines in the last few months.   She reports good compliance with treatment. She feels that condition is Improved. She is not having side effects.  ------------------------------------------------------------------------------------    Allergies  Allergen Reactions  . Pentazocine Anaphylaxis  . Statins Other (See Comments)    pancreatitis  . Morphine Nausea And Vomiting    And migraine     Current Outpatient Medications:  .  cyanocobalamin (,VITAMIN B-12,) 1000 MCG/ML injection, INJECT 1 ML AS DIRECTED EVERY 30 (THIRTY) DAYS., Disp: , Rfl: 11 .  diclofenac sodium (VOLTAREN) 1 % GEL, Voltaren 1 % topical gel  APPLY 4 GRAM TO  THE AFFECTED AREA(S) BY TOPICAL ROUTE AS NEEDED, Disp: , Rfl:  .  estradiol (ESTRACE) 2 MG tablet, TAKE 1 TABLET BY MOUTH EVERY DAY, Disp: 90 tablet, Rfl: 1 .  gabapentin (NEURONTIN) 400 MG capsule, Take 1 capsule (400 mg total) by mouth 2 (two) times daily. (Patient taking differently: Take 400 mg by mouth 3 (three) times daily. ), Disp: 180 capsule, Rfl: 2 .  hydrochlorothiazide (HYDRODIURIL) 25 MG tablet, Take 1 tablet (25 mg total) by mouth daily. (Patient taking differently: Take by mouth daily. ), Disp: 90 tablet, Rfl: 1 .  lisinopril (PRINIVIL,ZESTRIL) 10 MG tablet, TAKE 1 TABLET BY MOUTH EVERY DAY, Disp: 30 tablet, Rfl: 5 .  nortriptyline (PAMELOR) 50 MG capsule, Take 1 capsule (50 mg total) by mouth at bedtime., Disp: 90 capsule, Rfl: 2 .  oxyCODONE-acetaminophen (PERCOCET) 10-325 MG tablet, Take 1 tablet by mouth every 6 (six) hours as needed., Disp: , Rfl: 0 .  OXYCONTIN 20 MG 12 hr tablet, Take 20 mg by mouth every 12 (twelve) hours., Disp: , Rfl: 0 .  promethazine (PHENERGAN) 25 MG tablet, Take 1 tablet by mouth every 4 (four) hours as needed., Disp: , Rfl:  .  SUMAtriptan (IMITREX) 100 MG tablet, Take 1 tablet by mouth daily as needed., Disp: , Rfl:  .  tiZANidine (ZANAFLEX) 4 MG tablet, Take 1 tablet (4 mg total) by mouth 2 (two) times daily., Disp: 180 tablet, Rfl: 2  Review of Systems  Constitutional: Positive for appetite change. Negative for activity change, chills, diaphoresis, fatigue, fever and unexpected weight change.  Respiratory: Negative  for shortness of breath.   Cardiovascular: Negative for chest pain, palpitations and leg swelling.  Neurological: Negative for syncope.    Social History   Tobacco Use  . Smoking status: Former Smoker    Years: 10.00    Types: Cigarettes    Last attempt to quit: 01/23/1983    Years since quitting: 35.2  . Smokeless tobacco: Never Used  . Tobacco comment: was social smoker, not everyday  Substance Use Topics  . Alcohol use: No     Objective:   BP 130/72 (BP Location: Left Arm, Patient Position: Sitting, Cuff Size: Large)   Pulse 86   Temp 97.6 F (36.4 C) (Oral)   Resp 16   Wt 187 lb (84.8 kg)   SpO2 99%   BMI 27.62 kg/m  Vitals:   04/24/18 1500  BP: 130/72  Pulse: 86  Resp: 16  Temp: 97.6 F (36.4 C)  TempSrc: Oral  SpO2: 99%  Weight: 187 lb (84.8 kg)     Physical Exam  Constitutional: She is oriented to person, place, and time. She appears well-developed and well-nourished. No distress.  HENT:  Head: Normocephalic and atraumatic.  Eyes: Conjunctivae are normal. No scleral icterus.  Neck: Neck supple. No thyromegaly present.  Cardiovascular: Normal rate, regular rhythm, normal heart sounds and intact distal pulses.  No murmur heard. Pulmonary/Chest: Effort normal and breath sounds normal. No respiratory distress. She has no wheezes. She has no rales.  Musculoskeletal: She exhibits no edema.  Lymphadenopathy:    She has no cervical adenopathy.  Neurological: She is alert and oriented to person, place, and time.  Skin: Skin is warm and dry. Capillary refill takes less than 2 seconds. No rash noted.  Psychiatric: She has a normal mood and affect. Her behavior is normal.  Vitals reviewed.     Assessment & Plan:   Problem List Items Addressed This Visit      Cardiovascular and Mediastinum   Migraines    Well controlled Refilled imitrex Continue nortriptyline 50mg  qhs Could increase dose in the future if needed      Relevant Medications   SUMAtriptan (IMITREX) 100 MG tablet   Hypertension - Primary    Well controlled Continue HCTZ and lisinopril at current doses Reviewed recent CMP - wnl        Other   Pernicious anemia    Continue monthly B12 injections Given today      Relevant Medications   cyanocobalamin ((VITAMIN B-12)) injection 1,000 mcg (Completed) (Start on 04/24/2018  4:00 PM)       Return in about 6 months (around 10/25/2018) for physical.   The entirety of  the information documented in the History of Present Illness, Review of Systems and Physical Exam were personally obtained by me. Portions of this information were initially documented by Raquel Sarna Ratchford, CMA and reviewed by me for thoroughness and accuracy.    Virginia Crews, MD, MPH [facility] 04/24/2018 3:56 PM

## 2018-04-24 NOTE — Assessment & Plan Note (Signed)
Continue monthly B12 injections Given today

## 2018-04-24 NOTE — Assessment & Plan Note (Signed)
Well controlled Continue HCTZ and lisinopril at current doses Reviewed recent CMP - wnl

## 2018-04-24 NOTE — Assessment & Plan Note (Signed)
Well controlled Refilled imitrex Continue nortriptyline 50mg  qhs Could increase dose in the future if needed

## 2018-04-29 DIAGNOSIS — M545 Low back pain: Secondary | ICD-10-CM | POA: Diagnosis not present

## 2018-05-06 DIAGNOSIS — I10 Essential (primary) hypertension: Secondary | ICD-10-CM | POA: Diagnosis present

## 2018-05-06 DIAGNOSIS — G43909 Migraine, unspecified, not intractable, without status migrainosus: Secondary | ICD-10-CM | POA: Diagnosis present

## 2018-05-06 DIAGNOSIS — T84050A Periprosthetic osteolysis of internal prosthetic right hip joint, initial encounter: Secondary | ICD-10-CM | POA: Diagnosis present

## 2018-05-06 DIAGNOSIS — M7061 Trochanteric bursitis, right hip: Secondary | ICD-10-CM | POA: Diagnosis present

## 2018-05-06 DIAGNOSIS — G894 Chronic pain syndrome: Secondary | ICD-10-CM | POA: Diagnosis not present

## 2018-05-06 DIAGNOSIS — Z981 Arthrodesis status: Secondary | ICD-10-CM | POA: Diagnosis not present

## 2018-05-06 DIAGNOSIS — Z96641 Presence of right artificial hip joint: Secondary | ICD-10-CM | POA: Diagnosis not present

## 2018-05-06 DIAGNOSIS — K219 Gastro-esophageal reflux disease without esophagitis: Secondary | ICD-10-CM | POA: Diagnosis present

## 2018-05-06 DIAGNOSIS — M25551 Pain in right hip: Secondary | ICD-10-CM | POA: Diagnosis not present

## 2018-05-06 DIAGNOSIS — E7800 Pure hypercholesterolemia, unspecified: Secondary | ICD-10-CM | POA: Diagnosis present

## 2018-05-06 DIAGNOSIS — M797 Fibromyalgia: Secondary | ICD-10-CM | POA: Diagnosis present

## 2018-05-06 DIAGNOSIS — Z87891 Personal history of nicotine dependence: Secondary | ICD-10-CM | POA: Diagnosis not present

## 2018-05-06 DIAGNOSIS — T8484XA Pain due to internal orthopedic prosthetic devices, implants and grafts, initial encounter: Secondary | ICD-10-CM | POA: Diagnosis present

## 2018-05-12 DIAGNOSIS — M25551 Pain in right hip: Secondary | ICD-10-CM | POA: Diagnosis not present

## 2018-05-12 DIAGNOSIS — M6281 Muscle weakness (generalized): Secondary | ICD-10-CM | POA: Diagnosis not present

## 2018-05-12 DIAGNOSIS — R262 Difficulty in walking, not elsewhere classified: Secondary | ICD-10-CM | POA: Diagnosis not present

## 2018-05-14 ENCOUNTER — Ambulatory Visit (INDEPENDENT_AMBULATORY_CARE_PROVIDER_SITE_OTHER): Payer: Medicare Other | Admitting: Physician Assistant

## 2018-05-14 ENCOUNTER — Encounter: Payer: Self-pay | Admitting: Physician Assistant

## 2018-05-14 VITALS — BP 142/86 | HR 96 | Temp 98.1°F | Resp 16 | Wt 182.0 lb

## 2018-05-14 DIAGNOSIS — R262 Difficulty in walking, not elsewhere classified: Secondary | ICD-10-CM | POA: Diagnosis not present

## 2018-05-14 DIAGNOSIS — M6281 Muscle weakness (generalized): Secondary | ICD-10-CM | POA: Diagnosis not present

## 2018-05-14 DIAGNOSIS — Z96649 Presence of unspecified artificial hip joint: Secondary | ICD-10-CM

## 2018-05-14 DIAGNOSIS — R000 Tachycardia, unspecified: Secondary | ICD-10-CM | POA: Diagnosis not present

## 2018-05-14 NOTE — Patient Instructions (Signed)
Sinus Tachycardia °Sinus tachycardia is a kind of fast heartbeat. In sinus tachycardia, the heart beats more than 100 times a minute. Sinus tachycardia starts in a part of the heart called the sinus node. Sinus tachycardia may be harmless, or it may be a sign of a serious condition. °What are the causes? °This condition may be caused by: °· Exercise or exertion. °· A fever. °· Pain. °· Loss of body fluids (dehydration). °· Severe bleeding (hemorrhage). °· Anxiety and stress. °· Certain substances, including: °? Alcohol. °? Caffeine. °? Tobacco and nicotine products. °? Diet pills. °? Illegal drugs. °· Medical conditions including: °? Heart disease. °? An infection. °? An overactive thyroid (hyperthyroidism). °? A lack of red blood cells (anemia). ° °What are the signs or symptoms? °Symptoms of this condition include: °· A feeling that the heart is beating quickly (palpitations). °· Suddenly noticing your heartbeat (cardiac awareness). °· Dizziness. °· Tiredness (fatigue). °· Shortness of breath. °· Chest pain. °· Nausea. °· Fainting. ° °How is this diagnosed? °This condition is diagnosed with: °· A physical exam. °· Other tests, such as: °? Blood tests. °? An electrocardiogram (ECG). This test measures the electrical activity of the heart. °? Holter monitoring. For this test, you wear a device that records your heartbeat for one or more days. ° °You may be referred to a heart specialist (cardiologist). °How is this treated? °Treatment for this condition depends on the cause or underlying condition. Treatment may involve: °· Treating the underlying condition. °· Taking new medicines or changing your current medicines as told by your health care provider. °· Making changes to your diet or lifestyle. °· Practicing relaxation methods. ° °Follow these instructions at home: °Lifestyle °· Do not use any products that contain nicotine or tobacco, such as cigarettes and e-cigarettes. If you need help quitting, ask your  health care provider. °· Learn relaxation methods, like deep breathing, to help you when you get stressed or anxious. °· Do not use illegal drugs, such as cocaine. °· Do not abuse alcohol. Limit alcohol intake to no more than 1 drink a day for non-pregnant women and 2 drinks a day for men. One drink equals 12 oz of beer, 5 oz of wine, or 1½ oz of hard liquor. °· Find time to rest and relax often. This reduces stress. °· Avoid: °? Caffeine. °? Stimulants such as over-the-counter diet pills or pills that help you to stay awake. °? Situations that cause anxiety or stress. °General instructions °· Drink enough fluids to keep your urine clear or pale yellow. °· Take over-the-counter and prescription medicines only as told by your health care provider. °· Keep all follow-up visits as told by your health care provider. This is important. °Contact a health care provider if: °· You have a fever. °· You have vomiting or diarrhea that keeps happening (is persistent). °Get help right away if: °· You have pain in your chest, upper arms, jaw, or neck. °· You become weak or dizzy. °· You feel faint. °· You have palpitations that do not go away. °This information is not intended to replace advice given to you by your health care provider. Make sure you discuss any questions you have with your health care provider. °Document Released: 01/17/2005 Document Revised: 07/07/2016 Document Reviewed: 06/24/2015 °[employer] Interactive Patient Education © 2018 [employer] ° °

## 2018-05-14 NOTE — Progress Notes (Signed)
Patient: Shannon Grimes Female    DOB: Mar 01, 1951   67 y.o.   MRN: [id number] Visit Date: 05/14/2018  Today's Provider: Trinna Post, PA-C   Chief Complaint  Patient presents with  . Tachycardia    Pt's heart rate at Orthopedics was 116    Subjective:    HPI   Shannon Grimes is a 67 y/o woman who is one week post op from a right total hip revision. She reports she was seen at [facility] today for follow up and the doctor noted that her pulse was 116 and advised her to come to her PCP to get checked out.  She reports she is on chronic pain medications for other orthopedic conditions. She reports she had her pain medications increased in the interim after her surgery but she did not take these breakthrough medications because they caused her nausea. She has completed a PT session this past Monday. She reports significant pain currently 8/10, mostly located in her right hip but also a more global pain which she attributes to the positioning during surgery.  She denies any chest pain, palpitations, SOB, calf pain or swelling. She has not been immobile and has been actively participating in PT. She does not smoke. Her pulse currently is 96 bpm. She denies fevers, chills, vomiting. Does endorse persistent nausea for which she takes Phenergan.     Allergies  Allergen Reactions  . Pentazocine Anaphylaxis  . Statins Other (See Comments)    pancreatitis  . Morphine Nausea And Vomiting    And migraine     Current Outpatient Medications:  .  cyanocobalamin (,VITAMIN B-12,) 1000 MCG/ML injection, INJECT 1 ML AS DIRECTED EVERY 30 (THIRTY) DAYS., Disp: , Rfl: 11 .  diclofenac sodium (VOLTAREN) 1 % GEL, Voltaren 1 % topical gel  APPLY 4 GRAM TO THE AFFECTED AREA(S) BY TOPICAL ROUTE AS NEEDED, Disp: , Rfl:  .  estradiol (ESTRACE) 2 MG tablet, TAKE 1 TABLET BY MOUTH EVERY DAY, Disp: 90 tablet, Rfl: 1 .  gabapentin (NEURONTIN) 400 MG capsule, Take 1 capsule (400 mg total) by mouth 2  (two) times daily. (Patient taking differently: Take 400 mg by mouth 3 (three) times daily. ), Disp: 180 capsule, Rfl: 2 .  hydrochlorothiazide (HYDRODIURIL) 25 MG tablet, Take 1 tablet (25 mg total) by mouth daily. (Patient taking differently: Take by mouth daily. ), Disp: 90 tablet, Rfl: 1 .  lisinopril (PRINIVIL,ZESTRIL) 10 MG tablet, TAKE 1 TABLET BY MOUTH EVERY DAY, Disp: 30 tablet, Rfl: 5 .  nortriptyline (PAMELOR) 50 MG capsule, Take 1 capsule (50 mg total) by mouth at bedtime., Disp: 90 capsule, Rfl: 2 .  oxyCODONE-acetaminophen (PERCOCET) 10-325 MG tablet, Take 1 tablet by mouth every 6 (six) hours as needed., Disp: , Rfl: 0 .  OXYCONTIN 20 MG 12 hr tablet, Take 20 mg by mouth every 12 (twelve) hours., Disp: , Rfl: 0 .  promethazine (PHENERGAN) 25 MG tablet, Take 1 tablet by mouth every 4 (four) hours as needed., Disp: , Rfl:  .  SUMAtriptan (IMITREX) 100 MG tablet, Take 1 tablet (100 mg total) by mouth daily as needed., Disp: 10 tablet, Rfl: 5 .  tiZANidine (ZANAFLEX) 4 MG tablet, Take 1 tablet (4 mg total) by mouth 2 (two) times daily., Disp: 180 tablet, Rfl: 2  Review of Systems  Constitutional: Positive for appetite change and fatigue. Negative for activity change, chills, diaphoresis, fever and unexpected weight change.  Respiratory: Negative.   Cardiovascular: Negative.  Gastrointestinal: Positive for nausea. Negative for abdominal distention, abdominal pain, anal bleeding, blood in stool, constipation, diarrhea, rectal pain and vomiting.  Neurological: Positive for headaches. Negative for dizziness and light-headedness.    Social History   Tobacco Use  . Smoking status: Former Smoker    Years: 10.00    Types: Cigarettes    Last attempt to quit: 01/23/1983    Years since quitting: 35.3  . Smokeless tobacco: Never Used  . Tobacco comment: was social smoker, not everyday  Substance Use Topics  . Alcohol use: No   Objective:   BP (!) 142/86 (BP Location: Left Arm, Patient  Position: Sitting, Cuff Size: Normal)   Pulse 96   Temp 98.1 F (36.7 C) (Oral)   Resp 16   Wt 182 lb (82.6 kg)   SpO2 97%   BMI 26.88 kg/m  Vitals:   05/14/18 1142  BP: (!) 142/86  Pulse: 96  Resp: 16  Temp: 98.1 F (36.7 C)  TempSrc: Oral  SpO2: 97%  Weight: 182 lb (82.6 kg)     Physical Exam  Constitutional: She is oriented to person, place, and time. She appears well-developed and well-nourished.  Patient appears uncomfortable and to be in pain while in the exam room, but otherwise not distressed.  Cardiovascular: Normal rate and regular rhythm.  Pulmonary/Chest: Effort normal and breath sounds normal.  Musculoskeletal: She exhibits no edema.  No deep venous system tenderness in calves bilaterally.   Neurological: She is alert and oriented to person, place, and time.  Skin: Skin is warm and dry.  Psychiatric: She has a normal mood and affect. Her behavior is normal.        Assessment & Plan:     1. Tachycardia  Patient is one week post op from right hip revision. She is afebrile, not in any distress, but is in significant pain. She denies any symptoms of pulmonary embolism or DVT. EKG from 03/21/2018 showed some possible LVH but was otherwise normal. Her pulse is WNL in the clinic currently, she reports she did some meditation in the lobby. Her BP is slightly elevated but she reports not taking her medications today due to nausea. She thinks her symptoms are due to pain and the post op period, and I have to agree. Think we can carefully observe and have her call us or be seen if worsening, patient agreeable to this plan. She has follow up in one week with orthopedics.   2. History of revision of total hip arthroplasty  See above.   Return if symptoms worsen or fail to improve.  The entirety of the information documented in the History of Present Illness, Review of Systems and Physical Exam were personally obtained by me. Portions of this information were initially  documented by Ashley Royalty, CMA and reviewed by me for thoroughness and accuracy.   I have spent 15 minutes with this patient, >50% of which was spent on counseling and coordination of care.      Trinna Post, PA-C  [facility]

## 2018-05-16 DIAGNOSIS — M6281 Muscle weakness (generalized): Secondary | ICD-10-CM | POA: Diagnosis not present

## 2018-05-16 DIAGNOSIS — M25551 Pain in right hip: Secondary | ICD-10-CM | POA: Diagnosis not present

## 2018-05-16 DIAGNOSIS — R262 Difficulty in walking, not elsewhere classified: Secondary | ICD-10-CM | POA: Diagnosis not present

## 2018-05-23 DIAGNOSIS — R262 Difficulty in walking, not elsewhere classified: Secondary | ICD-10-CM | POA: Diagnosis not present

## 2018-05-23 DIAGNOSIS — M25551 Pain in right hip: Secondary | ICD-10-CM | POA: Diagnosis not present

## 2018-05-23 DIAGNOSIS — M6281 Muscle weakness (generalized): Secondary | ICD-10-CM | POA: Diagnosis not present

## 2018-05-26 DIAGNOSIS — M6281 Muscle weakness (generalized): Secondary | ICD-10-CM | POA: Diagnosis not present

## 2018-05-26 DIAGNOSIS — M25551 Pain in right hip: Secondary | ICD-10-CM | POA: Diagnosis not present

## 2018-05-26 DIAGNOSIS — R262 Difficulty in walking, not elsewhere classified: Secondary | ICD-10-CM | POA: Diagnosis not present

## 2018-05-28 ENCOUNTER — Ambulatory Visit (INDEPENDENT_AMBULATORY_CARE_PROVIDER_SITE_OTHER): Payer: Medicare Other

## 2018-05-28 DIAGNOSIS — E538 Deficiency of other specified B group vitamins: Secondary | ICD-10-CM | POA: Diagnosis not present

## 2018-05-28 DIAGNOSIS — D510 Vitamin B12 deficiency anemia due to intrinsic factor deficiency: Secondary | ICD-10-CM

## 2018-05-28 MED ORDER — CYANOCOBALAMIN 1000 MCG/ML IJ SOLN
1000.0000 ug | Freq: Once | INTRAMUSCULAR | Status: AC
  Administered 2018-05-28: 1000 ug via INTRAMUSCULAR

## 2018-05-30 ENCOUNTER — Other Ambulatory Visit: Payer: Self-pay | Admitting: Family Medicine

## 2018-05-30 DIAGNOSIS — R262 Difficulty in walking, not elsewhere classified: Secondary | ICD-10-CM | POA: Diagnosis not present

## 2018-05-30 DIAGNOSIS — M25551 Pain in right hip: Secondary | ICD-10-CM | POA: Diagnosis not present

## 2018-05-30 DIAGNOSIS — M6281 Muscle weakness (generalized): Secondary | ICD-10-CM | POA: Diagnosis not present

## 2018-06-03 DIAGNOSIS — M25551 Pain in right hip: Secondary | ICD-10-CM | POA: Diagnosis not present

## 2018-06-03 DIAGNOSIS — R262 Difficulty in walking, not elsewhere classified: Secondary | ICD-10-CM | POA: Diagnosis not present

## 2018-06-03 DIAGNOSIS — M6281 Muscle weakness (generalized): Secondary | ICD-10-CM | POA: Diagnosis not present

## 2018-06-03 DIAGNOSIS — H40003 Preglaucoma, unspecified, bilateral: Secondary | ICD-10-CM | POA: Diagnosis not present

## 2018-06-06 DIAGNOSIS — R262 Difficulty in walking, not elsewhere classified: Secondary | ICD-10-CM | POA: Diagnosis not present

## 2018-06-06 DIAGNOSIS — M25551 Pain in right hip: Secondary | ICD-10-CM | POA: Diagnosis not present

## 2018-06-06 DIAGNOSIS — M6281 Muscle weakness (generalized): Secondary | ICD-10-CM | POA: Diagnosis not present

## 2018-06-09 DIAGNOSIS — M25551 Pain in right hip: Secondary | ICD-10-CM | POA: Diagnosis not present

## 2018-06-09 DIAGNOSIS — M6281 Muscle weakness (generalized): Secondary | ICD-10-CM | POA: Diagnosis not present

## 2018-06-09 DIAGNOSIS — H40003 Preglaucoma, unspecified, bilateral: Secondary | ICD-10-CM | POA: Diagnosis not present

## 2018-06-09 DIAGNOSIS — R262 Difficulty in walking, not elsewhere classified: Secondary | ICD-10-CM | POA: Diagnosis not present

## 2018-06-11 DIAGNOSIS — M545 Low back pain: Secondary | ICD-10-CM | POA: Diagnosis not present

## 2018-06-18 DIAGNOSIS — T84050D Periprosthetic osteolysis of internal prosthetic right hip joint, subsequent encounter: Secondary | ICD-10-CM | POA: Diagnosis not present

## 2018-06-18 DIAGNOSIS — Z96641 Presence of right artificial hip joint: Secondary | ICD-10-CM | POA: Diagnosis not present

## 2018-06-18 DIAGNOSIS — T84328A Displacement of other bone devices, implants and grafts, initial encounter: Secondary | ICD-10-CM | POA: Diagnosis not present

## 2018-06-23 ENCOUNTER — Ambulatory Visit: Payer: Self-pay | Admitting: Family Medicine

## 2018-06-25 ENCOUNTER — Ambulatory Visit (INDEPENDENT_AMBULATORY_CARE_PROVIDER_SITE_OTHER): Payer: Medicare Other

## 2018-06-25 DIAGNOSIS — E538 Deficiency of other specified B group vitamins: Secondary | ICD-10-CM

## 2018-06-25 MED ORDER — CYANOCOBALAMIN 1000 MCG/ML IJ SOLN
1000.0000 ug | Freq: Once | INTRAMUSCULAR | Status: AC
  Administered 2018-06-25: 1000 ug via INTRAMUSCULAR

## 2018-07-01 DIAGNOSIS — H2513 Age-related nuclear cataract, bilateral: Secondary | ICD-10-CM | POA: Diagnosis not present

## 2018-07-16 DIAGNOSIS — M47816 Spondylosis without myelopathy or radiculopathy, lumbar region: Secondary | ICD-10-CM | POA: Diagnosis not present

## 2018-07-17 ENCOUNTER — Telehealth: Payer: Self-pay

## 2018-07-17 ENCOUNTER — Ambulatory Visit
Admission: RE | Admit: 2018-07-17 | Discharge: 2018-07-17 | Disposition: A | Discharge status: Home / Self Care | Payer: Medicare Other | Source: Ambulatory Visit | Attending: Family Medicine | Admitting: Family Medicine

## 2018-07-17 ENCOUNTER — Ambulatory Visit (INDEPENDENT_AMBULATORY_CARE_PROVIDER_SITE_OTHER): Payer: Medicare Other | Admitting: Family Medicine

## 2018-07-17 ENCOUNTER — Encounter: Payer: Self-pay | Admitting: Family Medicine

## 2018-07-17 VITALS — BP 142/76 | HR 90 | Temp 97.4°F | Resp 16 | Wt 189.0 lb

## 2018-07-17 DIAGNOSIS — R29898 Other symptoms and signs involving the musculoskeletal system: Secondary | ICD-10-CM

## 2018-07-17 DIAGNOSIS — R0609 Other forms of dyspnea: Secondary | ICD-10-CM

## 2018-07-17 DIAGNOSIS — R0602 Shortness of breath: Secondary | ICD-10-CM

## 2018-07-17 DIAGNOSIS — I700 Atherosclerosis of aorta: Secondary | ICD-10-CM | POA: Insufficient documentation

## 2018-07-17 DIAGNOSIS — K859 Acute pancreatitis without necrosis or infection, unspecified: Secondary | ICD-10-CM | POA: Diagnosis not present

## 2018-07-17 DIAGNOSIS — R1013 Epigastric pain: Secondary | ICD-10-CM | POA: Diagnosis not present

## 2018-07-17 LAB — POCT I-STAT CREATININE: CREATININE: 0.9 mg/dL (ref 0.44–1.00)

## 2018-07-17 MED ORDER — IOPAMIDOL (ISOVUE-370) INJECTION 76%
75.0000 mL | Freq: Once | INTRAVENOUS | Status: AC | PRN
  Administered 2018-07-17: 75 mL via INTRAVENOUS

## 2018-07-17 NOTE — Telephone Encounter (Signed)
Pt is c/o DOE and diaphoresis which has been occurring intermittently for about 6 weeks. Denies chest pain, N/V, edema. She does note some malaise and decreased appetite. Appointment made for this afternoon, and advised pt to go to ED if she starts experiencing chest pain, etc.

## 2018-07-17 NOTE — Telephone Encounter (Signed)
We will see her this afternoon.  Shannon Crews, MD, MPH [facility] 07/17/2018 11:51 AM

## 2018-07-17 NOTE — Patient Instructions (Addendum)
Call Dr. Rockey Situ for follow-up appointment for dyspnea on exertion  Discuss with spine specialist the right arm weakness and fingertip numbness  Shortness of Breath, Adult Shortness of breath means you have trouble breathing. Your lungs are organs for breathing. Follow these instructions at home: Pay attention to any changes in your symptoms. Take these actions to help with your condition:  Do not smoke. Smoking can cause shortness of breath. If you need help to quit smoking, ask your doctor.  Avoid things that can make it harder to breathe, such as: ? Mold. ? Dust. ? Air pollution. ? Chemical smells. ? Things that can cause allergy symptoms (allergens), if you have allergies.  Keep your living space clean and free of mold and dust.  Rest as needed. Slowly return to your usual activities.  Take over-the-counter and prescription medicines, including oxygen and inhaled medicines, only as told by your doctor.  Keep all follow-up visits as told by your doctor. This is important.  Contact a doctor if:  Your condition does not get better as soon as expected.  You have a hard time doing your normal activities, even after you rest.  You have new symptoms. Get help right away if:  You have trouble breathing when you are resting.  You feel light-headed or you faint.  You have a cough that is not helped by medicines.  You cough up blood.  You have pain with breathing.  You have pain in your chest, arms, shoulders, or belly (abdomen).  You have a fever.  You cannot walk up stairs.  You cannot exercise the way you normally do. This information is not intended to replace advice given to you by your health care provider. Make sure you discuss any questions you have with your health care provider. Document Released: 05/28/2008 Document Revised: 12/27/2016 Document Reviewed: 12/27/2016 Elsevier Interactive Patient Education  2017 [employer].

## 2018-07-17 NOTE — Progress Notes (Signed)
Patient: Shannon Grimes Female    DOB: 07-12-1951   67 y.o.   MRN: [id number] Visit Date: 07/17/2018  Today's Provider: [name], MD   I, Martha Clan, CMA, am acting as scribe for [name], MD.  Chief Complaint  Patient presents with  . Shortness of Breath   Subjective:    Shortness of Breath  This is a new problem. Episode onset: x 6 weeks. The problem has been waxing and waning. Associated symptoms include claudication (right leg), headaches (not improved with Imitrex), rhinorrhea and sputum production. Pertinent negatives include no abdominal pain, chest pain, ear pain, fever, hemoptysis, leg swelling, neck pain, orthopnea, rash, sore throat, swollen glands, syncope, vomiting or wheezing. The symptoms are aggravated by any activity. Associated symptoms comments: Pt is also c/o diaphoresis and presyncope. She also notes her right arm was cool to the touch while she was doing PT. She states she notices that her right fingers are tingling. . Her past medical history is significant for a recent surgery (THR revision).   She reports that her shortness of breath started soon after her recent total hip replacement revision of right hip.  She noticed it first during physical therapy.  She believes is been worsening, as she now cannot walk from her kitchen to her living room without getting short of breath.  This improves with rest.  She denies any chest pain with this, but does have diaphoresis and occasional nausea.  She decided to come in for an appointment today as she felt much worse yesterday and felt like she might pass out after trying to walk through [place] while using a cart to lean on.  She has had epigastric pain intermittently that reminds her of her previous pancreatitis.  This is been worse over the last few weeks.  She also reports right calf pain and tenderness.  Her right leg is chronically swollen more than her left leg.  She denies wheezing.  She has cough  at baseline that has not changed and is nonproductive.  She also reports an episode that occurred during physical therapy about 1 month ago.  Her right arm suddenly went cold and numb.  This persisted for about 5 days.  The temperature has improved, but she does have numbness remaining in the tips of her fingers of her right hand.  She is not sure if she is weak on this side.  She denies neck pain or radicular pain.     Allergies  Allergen Reactions  . Pentazocine Anaphylaxis  . Statins Other (See Comments)    pancreatitis  . Morphine Nausea And Vomiting    And migraine     Current Outpatient Medications:  .  cyanocobalamin (,VITAMIN B-12,) 1000 MCG/ML injection, INJECT 1 ML AS DIRECTED EVERY 30 (THIRTY) DAYS., Disp: , Rfl: 11 .  estradiol (ESTRACE) 2 MG tablet, TAKE 1 TABLET BY MOUTH EVERY DAY, Disp: 90 tablet, Rfl: 1 .  gabapentin (NEURONTIN) 400 MG capsule, Take 1 capsule (400 mg total) by mouth 2 (two) times daily. (Patient taking differently: Take 400 mg by mouth 3 (three) times daily. ), Disp: 180 capsule, Rfl: 2 .  hydrochlorothiazide (HYDRODIURIL) 25 MG tablet, TAKE 1 TABLET BY MOUTH EVERY DAY, Disp: 90 tablet, Rfl: 1 .  lisinopril (PRINIVIL,ZESTRIL) 10 MG tablet, TAKE 1 TABLET BY MOUTH EVERY DAY, Disp: 30 tablet, Rfl: 5 .  nortriptyline (PAMELOR) 50 MG capsule, Take 1 capsule (50 mg total) by mouth at bedtime., Disp: 90 capsule, Rfl:  2 .  oxyCODONE-acetaminophen (PERCOCET) 10-325 MG tablet, Take 1 tablet by mouth every 6 (six) hours as needed., Disp: , Rfl: 0 .  OXYCONTIN 20 MG 12 hr tablet, Take 20 mg by mouth every 12 (twelve) hours., Disp: , Rfl: 0 .  promethazine (PHENERGAN) 25 MG tablet, Take 1 tablet by mouth every 4 (four) hours as needed., Disp: , Rfl:  .  SUMAtriptan (IMITREX) 100 MG tablet, Take 1 tablet (100 mg total) by mouth daily as needed., Disp: 10 tablet, Rfl: 5 .  tiZANidine (ZANAFLEX) 4 MG tablet, Take 1 tablet (4 mg total) by mouth 2 (two) times daily., Disp:  180 tablet, Rfl: 2 .  diclofenac sodium (VOLTAREN) 1 % GEL, Voltaren 1 % topical gel  APPLY 4 GRAM TO THE AFFECTED AREA(S) BY TOPICAL ROUTE AS NEEDED, Disp: , Rfl:   Review of Systems  Constitutional: Positive for appetite change (decreased), diaphoresis and fatigue. Negative for chills and fever.  HENT: Positive for rhinorrhea. Negative for ear pain and sore throat.   Respiratory: Positive for sputum production and shortness of breath. Negative for hemoptysis and wheezing.   Cardiovascular: Positive for claudication (right leg). Negative for chest pain, orthopnea, leg swelling and syncope.  Gastrointestinal: Negative for abdominal pain and vomiting.  Musculoskeletal: Negative for neck pain.  Skin: Negative for rash.  Neurological: Positive for weakness, numbness and headaches (not improved with Imitrex). Negative for syncope.    Social History   Tobacco Use  . Smoking status: Former Smoker    Years: 10.00    Types: Cigarettes    Last attempt to quit: 01/23/1983    Years since quitting: 35.5  . Smokeless tobacco: Never Used  . Tobacco comment: was social smoker, not everyday  Substance Use Topics  . Alcohol use: No   Objective:   BP (!) 142/76 (BP Location: Left Arm, Patient Position: Sitting, Cuff Size: Large)   Pulse 90   Temp (!) 97.4 F (36.3 C) (Oral)   Resp 16   Wt 189 lb (85.7 kg)   SpO2 96%   BMI 27.91 kg/m  Vitals:   07/17/18 1547  BP: (!) 142/76  Pulse: 90  Resp: 16  Temp: (!) 97.4 F (36.3 C)  TempSrc: Oral  SpO2: 96%  Weight: 189 lb (85.7 kg)     Physical Exam  Constitutional: She is oriented to person, place, and time. She appears well-developed and well-nourished. She does not appear ill. No distress.  HENT:  Head: Normocephalic and atraumatic.  Mouth/Throat: Oropharynx is clear and moist.  Eyes: Pupils are equal, round, and reactive to light.  Neck: Neck supple. No thyromegaly present.  Cardiovascular: Normal rate, regular rhythm, normal heart  sounds and intact distal pulses.  No murmur heard. Pulmonary/Chest: Effort normal and breath sounds normal. No accessory muscle usage. No respiratory distress. She has no wheezes. She has no rhonchi. She has no rales. She exhibits no tenderness.  Abdominal: Soft. Bowel sounds are normal. She exhibits no distension and no mass. There is no tenderness. There is no guarding.  Musculoskeletal:       Right lower leg: She exhibits edema. She exhibits no tenderness.       Left lower leg: She exhibits no tenderness and no edema.  Lymphadenopathy:    She has no cervical adenopathy.  Neurological: She is alert and oriented to person, place, and time. She displays no tremor. A sensory deficit is present. No cranial nerve deficit. She exhibits normal muscle tone. Coordination and gait normal.  RUE with 4/5 strength compared to LUE in all muscle groups Sensation to light touch decreased over R fingertips  Skin: Skin is warm and dry. Capillary refill takes less than 2 seconds. No rash noted.  Psychiatric: She has a normal mood and affect. Her behavior is normal.  Vitals reviewed.   EKG: ST segment elevation in V1 through V3.  Large T waves elsewhere.  This is unchanged from previous EKG from 02/2018.   Ct Angio Chest W/cm &/or Wo Cm  Result Date: 07/17/2018 CLINICAL DATA:  Short of breath EXAM: CT ANGIOGRAPHY CHEST WITH CONTRAST TECHNIQUE: Multidetector CT imaging of the chest was performed using the standard protocol during bolus administration of intravenous contrast. Multiplanar CT image reconstructions and MIPs were obtained to evaluate the vascular anatomy. CONTRAST:  22mL ISOVUE-370 IOPAMIDOL (ISOVUE-370) INJECTION 76% COMPARISON:  11/21/2017 FINDINGS: Cardiovascular: There are no filling defects in the pulmonary arterial tree to suggest acute pulmonary thromboembolism. There is no convincing evidence of aortic dissection or intramural hematoma. No evidence of aortic aneurysm. Minimal soft  atherosclerotic plaque in the thoracic aorta. Minimal atherosclerotic calcification in the mid descending thoracic aorta. Great vessels are grossly patent. Mediastinum/Nodes: There is no abnormal mediastinal adenopathy. No pericardial effusion. Thyroid is unremarkable. Lungs/Pleura: No pneumothorax or pleural effusion.  Clear lungs. Upper Abdomen: The pancreatic body and tail are somewhat ill-defined and there is blurring of the fat planes. These findings may indicate an inflammatory process of the pancreas or pancreatitis. Musculoskeletal: No vertebral compression deformity. Review of the MIP images confirms the above findings. IMPRESSION: No evidence of acute pulmonary thromboembolism. Possible edema of the pancreas or pancreatitis. Correlate with amylase and lipase levels as for the need for further imaging of the pancreas. Aortic Atherosclerosis (ICD10-I70.0). Electronically Signed   By: Marybelle Killings M.D.   On: 07/17/2018 18:06      Assessment & Plan:   1. Dyspnea on exertion 2. Shortness of breath -New problem - Given recent surgery and immobility, concern for possible PE CTA chest later resulted and negative for acute PE - she does have pancreatitis on CTA, but this does not explain her DOE/SOB -Patient with clear lung exam, so doubt asthma or COPD or pneumonia or other infection as the cause of her shortness of breath -Advised her to schedule follow-up with her cardiologist to see if there may be a cardiac cause for her dyspnea on exertion  -she does have strong family history of heart disease and has not had a stress test - EKG 12-Lead - CT Angio Chest W/Cm &/Or Wo Cm; Future  3. Right arm weakness -Patient describes concerning episode of arm going numb and cold during physical therapy - This has mostly resolved at this point, but she is notably weak in her right arm compared to her left arm and exam today -She does follow with a spine specialist at orthopedics for her neck and low back  and believes that she had MRI in the past that showed some cervical disc protrusion -She will discuss further with her orthopedist and imaging will be considered  4. Epigastric pain 5. Acute pancreatitis, unspecified complication status, unspecified pancreatitis type -Patient with previous history of pancreatitis not related to gallstones or alcohol -She has epigastric pain that is similar to previous pancreatitis and inflammation of the pancreas noted on imaging -We will get labs to confirm -Advised bland diet -She takes chronic pain medications at baseline, so we will not change these -Return precautions discussed - Lipase - Comprehensive metabolic panel -  CBC   Return in about 1 month (around 08/14/2018).   Addressed extensive list of chronic and acute medical problems today requiring extensive time in counseling and coordination of care.  Over half of this 40 minute visit were spent in counseling and coordinating care of multiple medical problems.    The entirety of the information documented in the History of Present Illness, Review of Systems and Physical Exam were personally obtained by me. Portions of this information were initially documented by Raquel Sarna Ratchford, CMA and reviewed by me for thoroughness and accuracy.    Virginia Crews, MD, MPH [facility] 07/18/2018 8:57 AM

## 2018-07-18 DIAGNOSIS — R1013 Epigastric pain: Secondary | ICD-10-CM | POA: Diagnosis not present

## 2018-07-18 DIAGNOSIS — K859 Acute pancreatitis without necrosis or infection, unspecified: Secondary | ICD-10-CM | POA: Diagnosis not present

## 2018-07-19 LAB — COMPREHENSIVE METABOLIC PANEL
ALBUMIN: 4.6 g/dL (ref 3.6–4.8)
ALT: 22 IU/L (ref 0–32)
AST: 29 IU/L (ref 0–40)
Albumin/Globulin Ratio: 1.5 (ref 1.2–2.2)
Alkaline Phosphatase: 81 IU/L (ref 39–117)
BUN / CREAT RATIO: 28 (ref 12–28)
BUN: 25 mg/dL (ref 8–27)
Bilirubin Total: 0.5 mg/dL (ref 0.0–1.2)
CO2: 20 mmol/L (ref 20–29)
CREATININE: 0.89 mg/dL (ref 0.57–1.00)
Calcium: 9.5 mg/dL (ref 8.7–10.3)
Chloride: 97 mmol/L (ref 96–106)
GFR calc non Af Amer: 67 mL/min/{1.73_m2} (ref >59)
GFR, EST AFRICAN AMERICAN: 78 mL/min/{1.73_m2} (ref >59)
GLOBULIN, TOTAL: 3 g/dL (ref 1.5–4.5)
GLUCOSE: 95 mg/dL (ref 65–99)
Potassium: 4.2 mmol/L (ref 3.5–5.2)
SODIUM: 136 mmol/L (ref 134–144)
Total Protein: 7.6 g/dL (ref 6.0–8.5)

## 2018-07-19 LAB — CBC
HEMATOCRIT: 39.5 % (ref 34.0–46.6)
Hemoglobin: 13.1 g/dL (ref 11.1–15.9)
MCH: 30.2 pg (ref 26.6–33.0)
MCHC: 33.2 g/dL (ref 31.5–35.7)
MCV: 91 fL (ref 79–97)
Platelets: 240 10*3/uL (ref 150–450)
RBC: 4.34 x10E6/uL (ref 3.77–5.28)
RDW: 14.4 % (ref 12.3–15.4)
WBC: 7.5 10*3/uL (ref 3.4–10.8)

## 2018-07-19 LAB — LIPASE: Lipase: 23 U/L (ref 14–72)

## 2018-07-21 ENCOUNTER — Encounter: Payer: Self-pay | Admitting: Family Medicine

## 2018-07-22 ENCOUNTER — Telehealth: Payer: Self-pay

## 2018-07-22 NOTE — Telephone Encounter (Signed)
Pt advised and acknowledges understanding.

## 2018-07-22 NOTE — Telephone Encounter (Signed)
-----   Message from Virginia Crews, MD sent at 07/21/2018  8:33 AM EDT ----- Lipase (pancreatic enzyme) and other labs are all normal.  Wonder if the inflammation of pancreas seen on CT is chronic inflammation.  Does not appear to be acute flare at this time.  Virginia Crews, MD, MPH [facility] 07/21/2018 8:33 AM

## 2018-07-30 ENCOUNTER — Ambulatory Visit: Payer: Self-pay

## 2018-07-30 ENCOUNTER — Ambulatory Visit (INDEPENDENT_AMBULATORY_CARE_PROVIDER_SITE_OTHER): Payer: Medicare Other

## 2018-07-30 DIAGNOSIS — E538 Deficiency of other specified B group vitamins: Secondary | ICD-10-CM

## 2018-07-30 DIAGNOSIS — M25551 Pain in right hip: Secondary | ICD-10-CM | POA: Diagnosis not present

## 2018-07-30 DIAGNOSIS — G894 Chronic pain syndrome: Secondary | ICD-10-CM | POA: Diagnosis not present

## 2018-07-30 DIAGNOSIS — M542 Cervicalgia: Secondary | ICD-10-CM | POA: Diagnosis not present

## 2018-07-30 MED ORDER — CYANOCOBALAMIN 1000 MCG/ML IJ SOLN
1000.0000 ug | Freq: Once | INTRAMUSCULAR | Status: AC
  Administered 2018-07-30: 1000 ug via INTRAMUSCULAR

## 2018-07-30 NOTE — Progress Notes (Signed)
Patient due to come back in 1 month for next B12 injection.

## 2018-08-15 DIAGNOSIS — I700 Atherosclerosis of aorta: Secondary | ICD-10-CM | POA: Insufficient documentation

## 2018-08-15 NOTE — Progress Notes (Signed)
Cardiology Office Note  Date:  08/18/2018   ID:  Shannon Grimes, DOB 08-03-1951, MRN [id number]  PCP:  Virginia Crews, MD   Chief Complaint  Patient presents with  . Other    6 month follow up. Patient c/o SOB starting about 3 months ago. Meds reviewed verbally with patient.     HPI:  [name] is a pleasant 67 year old woman with past medical history of Pancreatitis "from statins" Smoker in her 44s Hyperlipidemia, statin intolerance Palpitations, Migraines Chronic orthopedic issues, chronic leg and back pain Coronary calcium score of zero in 10/2017 Aortic Atherosclerosis Who presents for evaluation of high blood pressure numbers, palpitations, elevated cholesterol  She had hip revision May 2019 Since then has noticed some shortness of breath Symptoms do not present when she is swimming in a swimming pool or walking doing groceries only has shortness of breath making the bed walking short distances in her house  No regular exercise program She had CT scan chest, results reviewed with her in detail No coronary calcification and no pulmonary embolism Does not appear to have significant lung disease  Labs:  HCT 39 BMP ok HBA1C 5.7  EKG personally reviewed by myself on todays visit  shows sinus tachycardia rate 100 bpm Poor R-wave progression to the anterior precordial leads left axis deviation  Other past medical history reviewed  Many years ago fell off a porch, suffered fracture  gangrenous gallbladder  several surgeries later has chronic pain in leg and back  Previously on nadolol for migraines Off nadolol  since 10/24 as she did not like side effects  Reports that she is in a clinical trial for cholesterol through primary care  Previous total cholesterol 210 LDL 91   PMH:   has a past medical history of Allergy, Anemia, Blood transfusion without reported diagnosis, GERD (gastroesophageal reflux disease), Neuromuscular disorder ([place]), and  Osteoarthritis.  PSH:    Past Surgical History:  Procedure Laterality Date  . ANKLE FRACTURE SURGERY  1994   screws removed in 2007  . ANKLE FUSION Right 2006  . APPENDECTOMY  1984  . CHOLECYSTECTOMY  1994  . Hip abductor attachment Right 2013  . HIP FRACTURE SURGERY  2001  . HIP SURGERY Right    Revision  . ILIOTIBIAL BAND RELEASE  2016  . LUMBAR FUSION  1999   L5-S1 fusion  . LUMBAR FUSION  2004   L3-L4 fusion  . LUMBAR FUSION  2016   L2-L5, needed revision of previous surgeries  . SACROILIAC JOINT FUSION Right 2012  . STEROID INJECTION TO SCAR    . TOTAL ABDOMINAL HYSTERECTOMY W/ BILATERAL SALPINGOOPHORECTOMY  1984   endometriosis  . TOTAL HIP ARTHROPLASTY Right 2009    Current Outpatient Medications  Medication Sig Dispense Refill  . cyanocobalamin (,VITAMIN B-12,) 1000 MCG/ML injection INJECT 1 ML AS DIRECTED EVERY 30 (THIRTY) DAYS.  11  . diclofenac sodium (VOLTAREN) 1 % GEL Voltaren 1 % topical gel  APPLY 4 GRAM TO THE AFFECTED AREA(S) BY TOPICAL ROUTE AS NEEDED    . estradiol (ESTRACE) 2 MG tablet TAKE 1 TABLET BY MOUTH EVERY DAY 90 tablet 1  . gabapentin (NEURONTIN) 400 MG capsule Take 1 capsule (400 mg total) by mouth 2 (two) times daily. (Patient taking differently: Take 400 mg by mouth 3 (three) times daily. ) 180 capsule 2  . hydrochlorothiazide (HYDRODIURIL) 25 MG tablet TAKE 1 TABLET BY MOUTH EVERY DAY 90 tablet 1  . lisinopril (PRINIVIL,ZESTRIL) 10 MG tablet TAKE 1  TABLET BY MOUTH EVERY DAY 30 tablet 5  . nortriptyline (PAMELOR) 50 MG capsule Take 1 capsule (50 mg total) by mouth at bedtime. 90 capsule 2  . oxyCODONE-acetaminophen (PERCOCET) 10-325 MG tablet Take 1 tablet by mouth every 6 (six) hours as needed.  0  . OXYCONTIN 20 MG 12 hr tablet Take 20 mg by mouth every 12 (twelve) hours.  0  . promethazine (PHENERGAN) 25 MG tablet Take 1 tablet by mouth every 4 (four) hours as needed.    . SUMAtriptan (IMITREX) 100 MG tablet Take 1 tablet (100 mg total) by  mouth daily as needed. 10 tablet 5  . tiZANidine (ZANAFLEX) 4 MG tablet Take 1 tablet (4 mg total) by mouth 2 (two) times daily. 180 tablet 2   No current facility-administered medications for this visit.      Allergies:   Pentazocine; Statins; and Morphine   Social History:  The patient  reports that she quit smoking about 35 years ago. Her smoking use included cigarettes. She quit after 10.00 years of use. She has never used smokeless tobacco. She reports that she does not drink alcohol or use drugs.   Family History:   family history includes Colon cancer (age of onset: 56) in her sister; Colon cancer (age of onset: 24) in her brother; Congestive Heart Failure in her mother; Heart disease (age of onset: 45) in her father; Kidney failure in her sister; Liver disease in her sister; Mitral valve prolapse in her sister; Stroke (age of onset: 46) in her mother.    Review of Systems: Review of Systems  Constitutional: Negative.   Respiratory: Negative.   Cardiovascular: Positive for palpitations.  Gastrointestinal: Negative.   Musculoskeletal: Positive for back pain and joint pain.  Neurological: Negative.   Psychiatric/Behavioral: Negative.   All other systems reviewed and are negative.    PHYSICAL EXAM: VS:  BP 134/70 (BP Location: Left Arm, Patient Position: Sitting, Cuff Size: Normal)   Pulse 99   Ht 5\' 9"  (1.753 m)   Wt 186 lb (84.4 kg)   BMI 27.47 kg/m  , BMI Body mass index is 27.47 kg/m. GEN: Well nourished, well developed, in no acute distress  HEENT: normal  Neck: no JVD, carotid bruits, or masses Cardiac: RRR; no murmurs, rubs, or gallops,no edema  Respiratory:  clear to auscultation bilaterally, normal work of breathing GI: soft, nontender, nondistended, + BS MS: no deformity or atrophy  Skin: warm and dry, no rash Neuro:  Strength and sensation are intact Psych: euthymic mood, full affect    Recent Labs: 11/19/2017: TSH 1.88 07/18/2018: ALT 22; BUN 25;  Creatinine, Ser 0.89; Hemoglobin 13.1; Platelets 240; Potassium 4.2; Sodium 136    Lipid Panel Lab Results  Component Value Date   CHOL 211 (H) 10/16/2017   HDL 41 (L) 10/16/2017   LDLCALC 132 (H) 10/16/2017   TRIG 250 (H) 10/16/2017      Wt Readings from Last 3 Encounters:  08/18/18 186 lb (84.4 kg)  07/17/18 189 lb (85.7 kg)  05/14/18 182 lb (82.6 kg)       ASSESSMENT AND PLAN:  Hyperlipidemia, unspecified hyperlipidemia type -  Currently not on a statin Very mild aortic atherosclerosis seen on CT scan Previously she reported being on a clinical trial for cholesterol  Elevated BP without diagnosis of hypertension Blood pressure is well controlled on today's visit. No changes made to the medications.  Palpitations Tachycardia today Uncertain if this is contributing to her shortness of breath with short  walks in the house Recommended she monitor her heart rate at home and call us if this runs high Zio monitor could be ordered if elevated  Shortness of breath Somewhat atypical in nature as she is able to swim and do other activities without symptoms but has shortness of breath and her house walking to the kitchen and making her bed Echocardiogram ordered to rule out structural heart disease and valve disease  Other chronic back pain Previous back surgery    Total encounter time more than 25 minutes  Greater than 50% was spent in counseling and coordination of care with the patient   Orders Placed This Encounter  Procedures  . EKG 12-Lead     Signed, Esmond Plants, M.D., Ph.D. 08/18/2018  [place], [employer]

## 2018-08-18 ENCOUNTER — Ambulatory Visit (INDEPENDENT_AMBULATORY_CARE_PROVIDER_SITE_OTHER): Payer: Medicare Other | Admitting: Cardiovascular Disease

## 2018-08-18 ENCOUNTER — Encounter: Payer: Self-pay | Admitting: Cardiovascular Disease

## 2018-08-18 ENCOUNTER — Encounter

## 2018-08-18 VITALS — BP 134/70 | HR 99 | Ht 69.0 in | Wt 186.0 lb

## 2018-08-18 DIAGNOSIS — I700 Atherosclerosis of aorta: Secondary | ICD-10-CM

## 2018-08-18 DIAGNOSIS — R002 Palpitations: Secondary | ICD-10-CM

## 2018-08-18 DIAGNOSIS — I10 Essential (primary) hypertension: Secondary | ICD-10-CM

## 2018-08-18 DIAGNOSIS — E785 Hyperlipidemia, unspecified: Secondary | ICD-10-CM

## 2018-08-18 DIAGNOSIS — M542 Cervicalgia: Secondary | ICD-10-CM | POA: Diagnosis not present

## 2018-08-18 DIAGNOSIS — G5600 Carpal tunnel syndrome, unspecified upper limb: Secondary | ICD-10-CM | POA: Diagnosis not present

## 2018-08-18 DIAGNOSIS — R0602 Shortness of breath: Secondary | ICD-10-CM | POA: Diagnosis not present

## 2018-08-18 NOTE — Patient Instructions (Addendum)
Medication Instructions:   No medication changes made  Labwork:  No new labs needed  Testing/Procedures:  We will order an echocardiogram for shortness of breath   Follow-Up: It was a pleasure seeing you in the office today. Please call us if you have new issues that need to be addressed before your next appt.  [phone]  Your physician wants you to follow-up in:  As needed  If you need a refill on your cardiac medications before your next appointment, please call your pharmacy.  For educational health videos Log in to : [email or web address] Or : [email or web address], password : [email or web address]

## 2018-08-20 DIAGNOSIS — H2511 Age-related nuclear cataract, right eye: Secondary | ICD-10-CM | POA: Diagnosis not present

## 2018-08-27 ENCOUNTER — Ambulatory Visit (INDEPENDENT_AMBULATORY_CARE_PROVIDER_SITE_OTHER): Payer: Medicare Other

## 2018-08-27 DIAGNOSIS — E538 Deficiency of other specified B group vitamins: Secondary | ICD-10-CM

## 2018-08-27 MED ORDER — CYANOCOBALAMIN 1000 MCG/ML IJ SOLN
1000.0000 ug | Freq: Once | INTRAMUSCULAR | Status: AC
  Administered 2018-08-27: 1000 ug via INTRAMUSCULAR

## 2018-08-28 ENCOUNTER — Ambulatory Visit (INDEPENDENT_AMBULATORY_CARE_PROVIDER_SITE_OTHER): Payer: Medicare Other

## 2018-08-28 ENCOUNTER — Other Ambulatory Visit: Payer: Self-pay

## 2018-08-28 DIAGNOSIS — R0602 Shortness of breath: Secondary | ICD-10-CM

## 2018-09-08 ENCOUNTER — Other Ambulatory Visit: Payer: Self-pay

## 2018-09-08 ENCOUNTER — Encounter: Payer: Self-pay | Admitting: *Deleted

## 2018-09-11 NOTE — Discharge Instructions (Signed)

## 2018-09-15 ENCOUNTER — Ambulatory Visit
Admission: RE | Admit: 2018-09-15 | Discharge: 2018-09-15 | Disposition: A | Discharge status: Home / Self Care | Payer: Medicare Other | Source: Ambulatory Visit | Attending: Ophthalmology | Admitting: Ophthalmology

## 2018-09-15 ENCOUNTER — Ambulatory Visit: Payer: Medicare Other | Admitting: Anesthesiology

## 2018-09-15 ENCOUNTER — Encounter
Admission: RE | Disposition: A | Discharge status: Home / Self Care | Payer: Self-pay | Source: Ambulatory Visit | Attending: Ophthalmology

## 2018-09-15 DIAGNOSIS — H2511 Age-related nuclear cataract, right eye: Secondary | ICD-10-CM | POA: Diagnosis not present

## 2018-09-15 DIAGNOSIS — Z79899 Other long term (current) drug therapy: Secondary | ICD-10-CM | POA: Diagnosis not present

## 2018-09-15 DIAGNOSIS — Z79891 Long term (current) use of opiate analgesic: Secondary | ICD-10-CM | POA: Diagnosis not present

## 2018-09-15 DIAGNOSIS — I10 Essential (primary) hypertension: Secondary | ICD-10-CM | POA: Diagnosis not present

## 2018-09-15 DIAGNOSIS — Z96641 Presence of right artificial hip joint: Secondary | ICD-10-CM | POA: Insufficient documentation

## 2018-09-15 DIAGNOSIS — M797 Fibromyalgia: Secondary | ICD-10-CM | POA: Diagnosis not present

## 2018-09-15 DIAGNOSIS — Z87891 Personal history of nicotine dependence: Secondary | ICD-10-CM | POA: Insufficient documentation

## 2018-09-15 DIAGNOSIS — H2512 Age-related nuclear cataract, left eye: Secondary | ICD-10-CM | POA: Diagnosis not present

## 2018-09-15 DIAGNOSIS — Z7982 Long term (current) use of aspirin: Secondary | ICD-10-CM | POA: Insufficient documentation

## 2018-09-15 DIAGNOSIS — Z79890 Hormone replacement therapy (postmenopausal): Secondary | ICD-10-CM | POA: Insufficient documentation

## 2018-09-15 DIAGNOSIS — H25811 Combined forms of age-related cataract, right eye: Secondary | ICD-10-CM | POA: Diagnosis not present

## 2018-09-15 HISTORY — DX: Idiopathic aseptic necrosis of unspecified bone: M87.00

## 2018-09-15 HISTORY — DX: Essential (primary) hypertension: I10

## 2018-09-15 HISTORY — PX: CATARACT EXTRACTION W/PHACO: SHX586

## 2018-09-15 HISTORY — DX: Migraine, unspecified, not intractable, without status migrainosus: G43.909

## 2018-09-15 HISTORY — DX: Lesion of ulnar nerve, right upper limb: G56.21

## 2018-09-15 HISTORY — DX: Carpal tunnel syndrome, right upper limb: G56.01

## 2018-09-15 HISTORY — DX: Nausea with vomiting, unspecified: R11.2

## 2018-09-15 HISTORY — DX: Other specified postprocedural states: Z98.890

## 2018-09-15 SURGERY — PHACOEMULSIFICATION, CATARACT, WITH IOL INSERTION
Anesthesia: Monitor Anesthesia Care | Site: Eye | Laterality: Right

## 2018-09-15 MED ORDER — ARMC OPHTHALMIC DILATING DROPS
1.0000 "application " | OPHTHALMIC | Status: DC | PRN
  Administered 2018-09-15 (×3): 1 via OPHTHALMIC

## 2018-09-15 MED ORDER — LIDOCAINE HCL (PF) 2 % IJ SOLN
INTRAOCULAR | Status: DC | PRN
  Administered 2018-09-15: 1 mL via INTRAOCULAR

## 2018-09-15 MED ORDER — MOXIFLOXACIN HCL 0.5 % OP SOLN
OPHTHALMIC | Status: DC | PRN
  Administered 2018-09-15: 0.2 mL via OPHTHALMIC

## 2018-09-15 MED ORDER — SODIUM HYALURONATE 10 MG/ML IO SOLN
INTRAOCULAR | Status: DC | PRN
  Administered 2018-09-15: 0.55 mL via INTRAOCULAR

## 2018-09-15 MED ORDER — TETRACAINE HCL 0.5 % OP SOLN
1.0000 [drp] | OPHTHALMIC | Status: DC | PRN
  Administered 2018-09-15 (×2): 1 [drp] via OPHTHALMIC

## 2018-09-15 MED ORDER — FENTANYL CITRATE (PF) 100 MCG/2ML IJ SOLN
INTRAMUSCULAR | Status: DC | PRN
  Administered 2018-09-15: 100 ug via INTRAVENOUS

## 2018-09-15 MED ORDER — SODIUM HYALURONATE 23 MG/ML IO SOLN
INTRAOCULAR | Status: DC | PRN
  Administered 2018-09-15: 0.6 mL via INTRAOCULAR

## 2018-09-15 MED ORDER — EPINEPHRINE PF 1 MG/ML IJ SOLN
INTRAOCULAR | Status: DC | PRN
  Administered 2018-09-15: 82 mL via OPHTHALMIC

## 2018-09-15 MED ORDER — LACTATED RINGERS IV SOLN: 1000.0000 mL | INTRAVENOUS | Status: DC

## 2018-09-15 MED ORDER — MIDAZOLAM HCL 2 MG/2ML IJ SOLN
INTRAMUSCULAR | Status: DC | PRN
  Administered 2018-09-15: 2 mg via INTRAVENOUS

## 2018-09-15 SURGICAL SUPPLY — 17 items
CANNULA ANT/CHMB 27G (MISCELLANEOUS) ×1 IMPLANT
CANNULA ANT/CHMB 27GA (MISCELLANEOUS) ×3 IMPLANT
DISSECTOR HYDRO NUCLEUS 50X22 (MISCELLANEOUS) ×3 IMPLANT
GLOVE BIO SURGEON STRL SZ8 (GLOVE) ×3 IMPLANT
GLOVE SURG LX 7.5 STRW (GLOVE) ×2
GLOVE SURG LX STRL 7.5 STRW (GLOVE) ×1 IMPLANT
GOWN STRL REUS W/ TWL LRG LVL3 (GOWN DISPOSABLE) ×2 IMPLANT
GOWN STRL REUS W/TWL LRG LVL3 (GOWN DISPOSABLE) ×4
LENS IOL TECNIS ITEC 17.0 (Intraocular Lens) ×2 IMPLANT
MARKER SKIN DUAL TIP RULER LAB (MISCELLANEOUS) ×3 IMPLANT
PACK DR. KING ARMS (PACKS) ×3 IMPLANT
PACK EYE AFTER SURG (MISCELLANEOUS) ×3 IMPLANT
PACK OPTHALMIC (MISCELLANEOUS) ×3 IMPLANT
SYR 3ML LL SCALE MARK (SYRINGE) ×3 IMPLANT
SYR TB 1ML LUER SLIP (SYRINGE) ×3 IMPLANT
WATER STERILE IRR 500ML POUR (IV SOLUTION) ×3 IMPLANT
WIPE NON LINTING 3.25X3.25 (MISCELLANEOUS) ×3 IMPLANT

## 2018-09-15 NOTE — Anesthesia Procedure Notes (Signed)
Procedure Name: MAC Performed by: Shantea Poulton, CRNA Pre-anesthesia Checklist: Patient identified, Emergency Drugs available, Suction available, Timeout performed and Patient being monitored Patient Re-evaluated:Patient Re-evaluated prior to induction Oxygen Delivery Method: Nasal cannula Placement Confirmation: positive ETCO2       

## 2018-09-15 NOTE — H&P (Signed)
The History and Physical notes are on paper, have been signed, and are to be scanned.   I have examined the patient and there are no changes to the H&P.   Benay Pillow 09/15/2018 10:51 AM

## 2018-09-15 NOTE — Anesthesia Postprocedure Evaluation (Signed)
Anesthesia Post Note  Patient: Shannon Grimes  Procedure(s) Performed: CATARACT EXTRACTION PHACO AND INTRAOCULAR LENS PLACEMENT (IOC) RIGHT (Right Eye)  Patient location during evaluation: PACU Anesthesia Type: MAC Level of consciousness: awake and alert Pain management: pain level controlled Vital Signs Assessment: post-procedure vital signs reviewed and stable Respiratory status: spontaneous breathing, nonlabored ventilation, respiratory function stable and patient connected to nasal cannula oxygen Cardiovascular status: stable and blood pressure returned to baseline Postop Assessment: no apparent nausea or vomiting Anesthetic complications: no    Veda Canning

## 2018-09-15 NOTE — Transfer of Care (Signed)
Immediate Anesthesia Transfer of Care Note  Patient: Shannon Grimes  Procedure(s) Performed: CATARACT EXTRACTION PHACO AND INTRAOCULAR LENS PLACEMENT (IOC) RIGHT (Right Eye)  Patient Location: PACU  Anesthesia Type: MAC  Level of Consciousness: awake, alert  and patient cooperative  Airway and Oxygen Therapy: Patient Spontanous Breathing and Patient connected to supplemental oxygen  Post-op Assessment: Post-op Vital signs reviewed, Patient's Cardiovascular Status Stable, Respiratory Function Stable, Patent Airway and No signs of Nausea or vomiting  Post-op Vital Signs: Reviewed and stable  Complications: No apparent anesthesia complications

## 2018-09-15 NOTE — Op Note (Signed)
OPERATIVE NOTE  Shannon Grimes [id number] 09/15/2018   PREOPERATIVE DIAGNOSIS:  Nuclear sclerotic cataract left eye.  H25.12   POSTOPERATIVE DIAGNOSIS:    Nuclear sclerotic cataract left eye.     PROCEDURE:  Phacoemusification with posterior chamber intraocular lens placement of the left eye   LENS:   Implant Name Type Inv. Item Serial No. Manufacturer Lot No. LRB No. Used  LENS IOL DIOP 17.0 - [id number] Intraocular Lens LENS IOL DIOP 17.0 [id number]  Right 1       PCB00 +17.0   ULTRASOUND TIME: 0 minutes 51 seconds.  CDE 6.80   SURGEON:  Benay Pillow, MD, MPH   ANESTHESIA:  Topical with tetracaine drops augmented with 1% preservative-free intracameral lidocaine.  ESTIMATED BLOOD LOSS: <1 mL   COMPLICATIONS:  None.   DESCRIPTION OF PROCEDURE:  The patient was identified in the holding room and transported to the operating room and placed in the supine position under the operating microscope.  The left eye was identified as the operative eye and it was prepped and draped in the usual sterile ophthalmic fashion.   A 1.0 millimeter clear-corneal paracentesis was made at the 5:00 position. 0.5 ml of preservative-free 1% lidocaine with epinephrine was injected into the anterior chamber.  The anterior chamber was filled with Healon 5 viscoelastic.  A 2.4 millimeter keratome was used to make a near-clear corneal incision at the 2:00 position.  A curvilinear capsulorrhexis was made with a cystotome and capsulorrhexis forceps.  Balanced salt solution was used to hydrodissect and hydrodelineate the nucleus.   Phacoemulsification was then used in stop and chop fashion to remove the lens nucleus and epinucleus.  The remaining cortex was then removed using the irrigation and aspiration handpiece. Healon was then placed into the capsular bag to distend it for lens placement.  A lens was then injected into the capsular bag.  The remaining viscoelastic was aspirated.   Wounds were hydrated  with balanced salt solution.  The anterior chamber was inflated to a physiologic pressure with balanced salt solution.  Intracameral vigamox 0.1 mL undiltued was injected into the eye and a drop placed onto the ocular surface.  No wound leaks were noted.  The patient was taken to the recovery room in stable condition without complications of anesthesia or surgery  Benay Pillow 09/15/2018, 11:20 AM

## 2018-09-15 NOTE — Anesthesia Preprocedure Evaluation (Addendum)
Anesthesia Evaluation  Patient identified by MRN, date of birth, ID band Patient awake    Reviewed: Allergy & Precautions, NPO status , Patient's Chart, lab work & pertinent test results  History of Anesthesia Complications (+) PONV  Airway Mallampati: II  TM Distance: >3 FB     Dental   Pulmonary former smoker,    breath sounds clear to auscultation       Cardiovascular hypertension,  Rhythm:Regular Rate:Normal     Neuro/Psych  Headaches,    GI/Hepatic GERD  ,  Endo/Other    Renal/GU      Musculoskeletal  (+) Arthritis , Fibromyalgia -Chronic back and hip pain - s/p many orthopedic surgeries. Takes oxycontin, gabapentin and nortriptyline.   Abdominal   Peds  Hematology   Anesthesia Other Findings   Reproductive/Obstetrics                            Anesthesia Physical Anesthesia Plan  ASA: II  Anesthesia Plan: MAC   Post-op Pain Management:    Induction: Intravenous  PONV Risk Score and Plan:   Airway Management Planned: Natural Airway and Nasal Cannula  Additional Equipment:   Intra-op Plan:   Post-operative Plan:   Informed Consent: I have reviewed the patients History and Physical, chart, labs and discussed the procedure including the risks, benefits and alternatives for the proposed anesthesia with the patient or authorized representative who has indicated his/her understanding and acceptance.     Plan Discussed with: CRNA  Anesthesia Plan Comments:        Anesthesia Quick Evaluation

## 2018-09-16 ENCOUNTER — Encounter: Payer: Self-pay | Admitting: Ophthalmology

## 2018-09-24 DIAGNOSIS — M25551 Pain in right hip: Secondary | ICD-10-CM | POA: Diagnosis not present

## 2018-09-24 DIAGNOSIS — M25559 Pain in unspecified hip: Secondary | ICD-10-CM | POA: Diagnosis not present

## 2018-09-24 DIAGNOSIS — M545 Low back pain: Secondary | ICD-10-CM | POA: Diagnosis not present

## 2018-09-26 ENCOUNTER — Ambulatory Visit (INDEPENDENT_AMBULATORY_CARE_PROVIDER_SITE_OTHER): Payer: Medicare Other | Admitting: *Deleted

## 2018-09-26 DIAGNOSIS — E538 Deficiency of other specified B group vitamins: Secondary | ICD-10-CM | POA: Diagnosis not present

## 2018-09-26 MED ORDER — CYANOCOBALAMIN 1000 MCG/ML IJ SOLN
1000.0000 ug | Freq: Once | INTRAMUSCULAR | Status: AC
  Administered 2018-09-26: 1000 ug via INTRAMUSCULAR

## 2018-09-28 ENCOUNTER — Other Ambulatory Visit: Payer: Self-pay | Admitting: Family Medicine

## 2018-09-29 ENCOUNTER — Encounter: Payer: Self-pay | Admitting: Family Medicine

## 2018-09-29 ENCOUNTER — Ambulatory Visit
Admission: RE | Admit: 2018-09-29 | Discharge: 2018-09-29 | Disposition: A | Discharge status: Home / Self Care | Payer: Medicare Other | Source: Ambulatory Visit | Attending: Family Medicine | Admitting: Family Medicine

## 2018-09-29 ENCOUNTER — Ambulatory Visit (INDEPENDENT_AMBULATORY_CARE_PROVIDER_SITE_OTHER): Payer: Medicare Other | Admitting: Family Medicine

## 2018-09-29 VITALS — BP 160/90 | HR 101 | Temp 97.7°F | Wt 188.8 lb

## 2018-09-29 DIAGNOSIS — T402X5A Adverse effect of other opioids, initial encounter: Secondary | ICD-10-CM

## 2018-09-29 DIAGNOSIS — I10 Essential (primary) hypertension: Secondary | ICD-10-CM

## 2018-09-29 DIAGNOSIS — R109 Unspecified abdominal pain: Secondary | ICD-10-CM | POA: Insufficient documentation

## 2018-09-29 DIAGNOSIS — M79603 Pain in arm, unspecified: Secondary | ICD-10-CM | POA: Diagnosis not present

## 2018-09-29 DIAGNOSIS — R209 Unspecified disturbances of skin sensation: Secondary | ICD-10-CM | POA: Diagnosis not present

## 2018-09-29 DIAGNOSIS — K5900 Constipation, unspecified: Secondary | ICD-10-CM | POA: Diagnosis not present

## 2018-09-29 DIAGNOSIS — K5903 Drug induced constipation: Secondary | ICD-10-CM | POA: Diagnosis not present

## 2018-09-29 MED ORDER — LISINOPRIL 10 MG PO TABS: 10.0000 mg | ORAL_TABLET | Freq: Every day | ORAL | 3 refills | Status: DC

## 2018-09-29 NOTE — Progress Notes (Signed)
Patient: Shannon Grimes Female    DOB: 15-Nov-1951   67 y.o.   MRN: [id number] Visit Date: 09/29/2018  Today's Provider: [name], MD   Chief Complaint  Patient presents with  . Muscle Pain   Subjective:    HPI Patient present today C/O of right side pain for 3 months. Patient states that [facility] advised her to follow up with PCP. It was initially thought this may be related to her chronic back/hip pain and recent total hip revision, but it seems different than her MSK pain.  She has difficulty describing the pain, but it is intermittent, but mostly present.  Dull achy pain with occasional sharp stabs. She is taking chronic opioids and she recently started taking a stool softener.  She admits that she has to strain to have a bowel movement in her bowel movements are very hard.  She only has 1 bowel movement every 2 to 4 days, despite stool softener.  Patient also mentions an episode during physical therapy a few weeks ago when her right arm went cold, numb, and white.  This was while she was exercising and pumping her arms.  This has happened a few other times with exercise.  She states that her Orthopedist did EMG and found carpal tunnel syndrome and cubital tunnel.  She denies any symptoms currently.     Allergies  Allergen Reactions  . Pentazocine Anaphylaxis  . Statins Other (See Comments)    pancreatitis  . Morphine Nausea And Vomiting    And migraine     Current Outpatient Medications:  .  cyanocobalamin (,VITAMIN B-12,) 1000 MCG/ML injection, INJECT 1 ML AS DIRECTED EVERY 30 (THIRTY) DAYS., Disp: , Rfl: 11 .  diclofenac sodium (VOLTAREN) 1 % GEL, Voltaren 1 % topical gel  APPLY 4 GRAM TO THE AFFECTED AREA(S) BY TOPICAL ROUTE AS NEEDED, Disp: , Rfl:  .  estradiol (ESTRACE) 2 MG tablet, TAKE 1 TABLET BY MOUTH EVERY DAY, Disp: 90 tablet, Rfl: 1 .  gabapentin (NEURONTIN) 400 MG capsule, Take 1 capsule (400 mg total) by mouth 2 (two) times daily. (Patient  taking differently: Take 400 mg by mouth 3 (three) times daily. ), Disp: 180 capsule, Rfl: 2 .  hydrochlorothiazide (HYDRODIURIL) 25 MG tablet, TAKE 1 TABLET BY MOUTH EVERY DAY, Disp: 90 tablet, Rfl: 1 .  lisinopril (PRINIVIL,ZESTRIL) 10 MG tablet, TAKE 1 TABLET BY MOUTH EVERY DAY, Disp: 30 tablet, Rfl: 5 .  nortriptyline (PAMELOR) 50 MG capsule, Take 1 capsule (50 mg total) by mouth at bedtime., Disp: 90 capsule, Rfl: 2 .  oxyCODONE-acetaminophen (PERCOCET) 10-325 MG tablet, Take 1 tablet by mouth every 6 (six) hours as needed., Disp: , Rfl: 0 .  OXYCONTIN 20 MG 12 hr tablet, Take 20 mg by mouth every 12 (twelve) hours., Disp: , Rfl: 0 .  promethazine (PHENERGAN) 25 MG tablet, Take 1 tablet by mouth every 4 (four) hours as needed., Disp: , Rfl:  .  SUMAtriptan (IMITREX) 100 MG tablet, Take 1 tablet (100 mg total) by mouth daily as needed., Disp: 10 tablet, Rfl: 5 .  tiZANidine (ZANAFLEX) 4 MG tablet, Take 1 tablet (4 mg total) by mouth 2 (two) times daily., Disp: 180 tablet, Rfl: 2  Review of Systems  Constitutional: Positive for fatigue.  Respiratory: Negative.   Cardiovascular: Negative.   Musculoskeletal: Positive for back pain.    Social History   Tobacco Use  . Smoking status: Former Smoker    Years: 10.00  Types: Cigarettes    Last attempt to quit: 01/23/1983    Years since quitting: 35.7  . Smokeless tobacco: Never Used  . Tobacco comment: was social smoker, not everyday  Substance Use Topics  . Alcohol use: No   Objective:   BP (!) 160/90 (BP Location: Right Arm, Patient Position: Sitting, Cuff Size: Large)   Pulse (!) 101   Temp 97.7 F (36.5 C) (Oral)   Wt 188 lb 12.8 oz (85.6 kg)   SpO2 98%   BMI 27.88 kg/m  Vitals:   09/29/18 1543  BP: (!) 160/90  Pulse: (!) 101  Temp: 97.7 F (36.5 C)  TempSrc: Oral  SpO2: 98%  Weight: 188 lb 12.8 oz (85.6 kg)     Physical Exam  Constitutional: She is oriented to person, place, and time. She appears well-developed  and well-nourished.  HENT:  Head: Normocephalic and atraumatic.  Mouth/Throat: Oropharynx is clear and moist.  Eyes: Conjunctivae are normal. No scleral icterus.  Cardiovascular: Normal rate, regular rhythm, normal heart sounds and intact distal pulses.  No murmur heard. Pulmonary/Chest: Effort normal and breath sounds normal. No respiratory distress. She has no wheezes. She has no rales.  Abdominal: Soft. Bowel sounds are normal. She exhibits no distension and no mass. There is no tenderness. There is no guarding.  Musculoskeletal: She exhibits no edema or deformity.  Neurological: She is alert and oriented to person, place, and time.  Skin: Skin is warm and dry. Capillary refill takes less than 2 seconds. No rash noted.  Psychiatric: She has a normal mood and affect. Her behavior is normal.  Vitals reviewed.       Assessment & Plan:   Problem List Items Addressed This Visit      Cardiovascular and Mediastinum   Hypertension    Elevated today, but may be related to pain Will continue current meds and continue to monitor Lisinopril refilled      Relevant Medications   lisinopril (PRINIVIL,ZESTRIL) 10 MG tablet     Digestive   Constipation due to opioid therapy    Patient with significant constipation that is likely opioid induced Suspect that her right-sided abdominal pain is related to the constipation, which would also contribute to her nausea and lack of appetite She does not have any symptoms of obstruction Will obtain abdominal x-ray to assess amount of stool and ensure no obstruction Switch from Colace to Senokot-S Discussed that senna works well with opioids to avoid constipation May also use MiraLAX Return precautions discussed      Relevant Orders   DG Abd 2 Views (Completed)     Other   Painful and cold upper extremity    Patient symptoms are concerning for possible PAD or subclavian steal syndrome Discussed that Carpal Tunl. and Cuboid Tunl. do not explain  her arm going cold and white Will refer to vascular surgery for further evaluation and management      Relevant Orders   Ambulatory referral to Vascular Surgery   Right sided abdominal pain - Primary    Exam is benign This is a new problem, but fairly long-standing As above, suspect this is related to her opioid-induced constipation We will try medications as above Return precautions discussed Follow-up in 1 month      Relevant Orders   DG Abd 2 Views (Completed)       Return in about 4 weeks (around 10/27/2018) for abd pain f/u.   The entirety of the information documented in the History of Present  Illness, Review of Systems and Physical Exam were personally obtained by me. Portions of this information were initially documented by Tiburcio Pea and Hurman Horn, CMA and reviewed by me for thoroughness and accuracy.    Virginia Crews, MD, MPH [facility] 10/01/2018 1:07 PM

## 2018-09-29 NOTE — Patient Instructions (Addendum)
Senokot-S twice daily for constipation   Constipation, Adult Constipation is when a person has fewer bowel movements in a week than normal, has difficulty having a bowel movement, or has stools that are dry, hard, or larger than normal. Constipation may be caused by an underlying condition. It may become worse with age if a person takes certain medicines and does not take in enough fluids. Follow these instructions at home: Eating and drinking   Eat foods that have a lot of fiber, such as fresh fruits and vegetables, whole grains, and beans.  Limit foods that are high in fat, low in fiber, or overly processed, such as french fries, hamburgers, cookies, candies, and soda.  Drink enough fluid to keep your urine clear or pale yellow. General instructions  Exercise regularly or as told by your health care provider.  Go to the restroom when you have the urge to go. Do not hold it in.  Take over-the-counter and prescription medicines only as told by your health care provider. These include any fiber supplements.  Practice pelvic floor retraining exercises, such as deep breathing while relaxing the lower abdomen and pelvic floor relaxation during bowel movements.  Watch your condition for any changes.  Keep all follow-up visits as told by your health care provider. This is important. Contact a health care provider if:  You have pain that gets worse.  You have a fever.  You do not have a bowel movement after 4 days.  You vomit.  You are not hungry.  You lose weight.  You are bleeding from the anus.  You have thin, pencil-like stools. Get help right away if:  You have a fever and your symptoms suddenly get worse.  You leak stool or have blood in your stool.  Your abdomen is bloated.  You have severe pain in your abdomen.  You feel dizzy or you faint. This information is not intended to replace advice given to you by your health care provider. Make sure you discuss any  questions you have with your health care provider. Document Released: 09/07/2004 Document Revised: 06/29/2016 Document Reviewed: 05/30/2016 Elsevier Interactive Patient Education  2018 [employer].

## 2018-10-01 ENCOUNTER — Telehealth: Payer: Self-pay

## 2018-10-01 ENCOUNTER — Ambulatory Visit (INDEPENDENT_AMBULATORY_CARE_PROVIDER_SITE_OTHER): Payer: Medicare Other | Admitting: Vascular Surgery

## 2018-10-01 ENCOUNTER — Encounter (INDEPENDENT_AMBULATORY_CARE_PROVIDER_SITE_OTHER): Payer: Self-pay | Admitting: Vascular Surgery

## 2018-10-01 VITALS — BP 146/78 | HR 74 | Resp 16 | Ht 66.0 in | Wt 189.0 lb

## 2018-10-01 DIAGNOSIS — M79603 Pain in arm, unspecified: Secondary | ICD-10-CM | POA: Insufficient documentation

## 2018-10-01 DIAGNOSIS — E785 Hyperlipidemia, unspecified: Secondary | ICD-10-CM | POA: Diagnosis not present

## 2018-10-01 DIAGNOSIS — R200 Anesthesia of skin: Secondary | ICD-10-CM | POA: Diagnosis not present

## 2018-10-01 DIAGNOSIS — I10 Essential (primary) hypertension: Secondary | ICD-10-CM | POA: Diagnosis not present

## 2018-10-01 DIAGNOSIS — R1013 Epigastric pain: Secondary | ICD-10-CM | POA: Insufficient documentation

## 2018-10-01 DIAGNOSIS — M79601 Pain in right arm: Secondary | ICD-10-CM | POA: Diagnosis not present

## 2018-10-01 DIAGNOSIS — Z87891 Personal history of nicotine dependence: Secondary | ICD-10-CM | POA: Diagnosis not present

## 2018-10-01 DIAGNOSIS — R209 Unspecified disturbances of skin sensation: Secondary | ICD-10-CM

## 2018-10-01 NOTE — Assessment & Plan Note (Signed)
Patient with significant constipation that is likely opioid induced Suspect that her right-sided abdominal pain is related to the constipation, which would also contribute to her nausea and lack of appetite She does not have any symptoms of obstruction Will obtain abdominal x-ray to assess amount of stool and ensure no obstruction Switch from Colace to Senokot-S Discussed that senna works well with opioids to avoid constipation May also use MiraLAX Return precautions discussed

## 2018-10-01 NOTE — Telephone Encounter (Signed)
Patient has been advised. KW 

## 2018-10-01 NOTE — Progress Notes (Signed)
Subjective:    Patient ID: Shannon Grimes, female    DOB: 05/24/1951, 67 y.o.   MRN: [id number] Chief Complaint  Patient presents with  . New Patient (Initial Visit)    Right arm pain and cold   Presents as a new patient referred by Dr. Brita Romp for evaluation of right upper extremity pain and numbness.  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  The patient has had multiple shoulder, hip and spine surgeries.  The patient notes her discomfort started in May 2019.  She describes her discomfort as mostly numbness to the right upper extremity especially in the fingers.  She notes that her hands/fingers also "feel cold".  The patient denies any ulcer formation.  The patient notes that at times the tips of her fingers do turn "white".  The patient denies any recent surgery or trauma to the right upper extremity.  The patient has undergone an EMG which was notable for carpal tunnel syndrome however her primary care physician also wanted to rule out any vascular compromise to the arm.  The patient experiences the symptoms with and without activity.  The patient denies any fever, nausea vomiting.  The patient does note that her right bicep is larger than her left.  Review of Systems  Constitutional: Negative.   HENT: Negative.   Eyes: Negative.   Respiratory: Negative.   Cardiovascular:       Right Upper Extremity Pain and Numbness  Gastrointestinal: Negative.   Endocrine: Negative.   Genitourinary: Negative.   Musculoskeletal: Negative.   Skin: Negative.   Allergic/Immunologic: Negative.   Neurological: Negative.   Hematological: Negative.   Psychiatric/Behavioral: Negative.       Objective:   Physical Exam  Constitutional: She is oriented to person, place, and time. She appears well-developed and well-nourished. No distress.  HENT:  Head: Normocephalic and atraumatic.  Right Ear: External ear normal.  Left Ear: External ear normal.  Eyes: Pupils are  equal, round, and reactive to light. Conjunctivae and EOM are normal.  Neck: Normal range of motion.  Cardiovascular: Normal rate, regular rhythm, normal heart sounds and intact distal pulses.  Pulses:      Radial pulses are 2+ on the right side, and 2+ on the left side.  Good Radial, ulnar and brachial pulse noted on right upper extremity.  Pulmonary/Chest: Effort normal and breath sounds normal.  Musculoskeletal: Normal range of motion. She exhibits edema (The right bicep is noticeably bigger when compared to the left.).  Neurological: She is alert and oriented to person, place, and time.  Skin: Skin is warm and dry. Capillary refill takes less than 2 seconds. She is not diaphoretic.  There is no pallor or ulceration noted to the right hand.  Psychiatric: She has a normal mood and affect. Her behavior is normal. Judgment and thought content normal.  Vitals reviewed.  BP (!) 146/78 (BP Location: Left Arm, Patient Position: Sitting)   Pulse 74   Resp 16   Ht 5\' 6"  (1.676 m)   Wt 189 lb (85.7 kg)   BMI 30.51 kg/m   Past Medical History:  Diagnosis Date  . Allergy   . Anemia    pernicious  . Avascular necrosis (HCC)    right hip  . Blood transfusion without reported diagnosis    after hysterectomy  . Carpal tunnel syndrome on right   . Cubital tunnel syndrome on right   . GERD (gastroesophageal reflux disease)   . Hypertension   .  Migraine headache    3-4x/month  . Neuromuscular disorder ([place])    multiple ortho issues with abnormal EMGs  . Osteoarthritis   . PONV (postoperative nausea and vomiting)    also triggers migraines   Social History   Socioeconomic History  . Marital status: Married    Spouse name: Collier Salina  . Number of children: 2  . Years of education: bachelor's  . Highest education level: Bachelor's degree (e.g., BA, AB, BS)  Occupational History  . Occupation: Retired    Comment: Education officer, museum  Social Needs  . Financial resource strain: Not hard at all    . Food insecurity:    Worry: Never true    Inability: Never true  . Transportation needs:    Medical: No    Non-medical: No  Tobacco Use  . Smoking status: Former Smoker    Years: 10.00    Types: Cigarettes    Last attempt to quit: 01/23/1983    Years since quitting: 35.7  . Smokeless tobacco: Never Used  . Tobacco comment: was social smoker, not everyday  Substance and Sexual Activity  . Alcohol use: No  . Drug use: No  . Sexual activity: Yes    Birth control/protection: Surgical  Lifestyle  . Physical activity:    Days per week: Not on file    Minutes per session: Not on file  . Stress: Only a little  Relationships  . Social connections:    Talks on phone: Not on file    Gets together: Not on file    Attends religious service: Not on file    Active member of club or organization: Not on file    Attends meetings of clubs or organizations: Not on file    Relationship status: Not on file  . Intimate partner violence:    Fear of current or ex partner: No    Emotionally abused: No    Physically abused: No    Forced sexual activity: No  Other Topics Concern  . Not on file  Social History Narrative  . Not on file   Past Surgical History:  Procedure Laterality Date  . ANKLE FRACTURE SURGERY  1994   screws removed in 2007  . ANKLE FUSION Right 2006  . APPENDECTOMY  1984  . CATARACT EXTRACTION W/PHACO Right 09/15/2018   Procedure: CATARACT EXTRACTION PHACO AND INTRAOCULAR LENS PLACEMENT ([place]) RIGHT;  Surgeon: Eulogio Bear, MD;  Location: [facility];  Service: Ophthalmology;  Laterality: Right;  . CHOLECYSTECTOMY  1994  . Hip abductor attachment Right 2013  . HIP FRACTURE SURGERY  2001  . HIP SURGERY Right    Revision  . ILIOTIBIAL BAND RELEASE  2016  . LUMBAR FUSION  1999   L5-S1 fusion  . LUMBAR FUSION  2004   L3-L4 fusion  . LUMBAR FUSION  2016   L2-L5, needed revision of previous surgeries  . SACROILIAC JOINT FUSION Right 2012  . STEROID  INJECTION TO SCAR    . TOTAL ABDOMINAL HYSTERECTOMY W/ BILATERAL SALPINGOOPHORECTOMY  1984   endometriosis  . TOTAL HIP ARTHROPLASTY Right 2009   Family History  Problem Relation Age of Onset  . Stroke Mother 61  . Congestive Heart Failure Mother   . Heart disease Father 54  . Colon cancer Sister 80  . Kidney failure Sister        chronic  . Liver disease Sister        end stage  . Colon cancer Brother 81  .  Mitral valve prolapse Sister   . Breast cancer Neg Hx    Allergies  Allergen Reactions  . Pentazocine Anaphylaxis  . Statins Other (See Comments)    pancreatitis  . Morphine Nausea And Vomiting    And migraine      Assessment & Plan:  Presents as a new patient referred by Dr. Brita Romp for evaluation of right upper extremity pain and numbness.  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  The patient has had multiple shoulder, hip and spine surgeries.  The patient notes her discomfort started in May 2019.  She describes her discomfort as mostly numbness to the right upper extremity especially in the fingers.  She notes that her hands/fingers also "feel cold".  The patient denies any ulcer formation.  The patient notes that at times the tips of her fingers do turn "white".  The patient denies any recent surgery or trauma to the right upper extremity.  The patient has undergone an EMG which was notable for carpal tunnel syndrome however her primary care physician also wanted to rule out any vascular compromise to the arm.  The patient experiences the symptoms with and without activity.  The patient denies any fever, nausea vomiting.  The patient does note that her right bicep is larger than her left.  1. Pain and numbness of right upper extremity - New Patient with progressively worsening right upper extremity pain and numbness. The patient has had right shoulder surgery in the past. The patient does have carpal tunnel to the right wrist The patient's  right bicep is larger when compared to the left. I will order a right upper extremity arterial duplex with PPG waveforms and attempt to rule out any contributing peripheral artery disease I have discussed with the patient at length the risk factors for and pathogenesis of atherosclerotic disease and encouraged a healthy diet, regular exercise regimen and blood pressure / glucose control.  The patient was encouraged to call the office in the interim if he experiences any worsening pain or ulcer formation to her hand / toes.  - VAS [place] UPPER EXTREMITY ARTERIAL DUPLEX; Future  2. Hyperlipidemia, unspecified hyperlipidemia type - Stable Encouraged good control as its slows the progression of atherosclerotic disease  3. Essential hypertension - Stable Encouraged good control as its slows the progression of atherosclerotic disease  Current Outpatient Medications on File Prior to Visit  Medication Sig Dispense Refill  . cyanocobalamin (,VITAMIN B-12,) 1000 MCG/ML injection INJECT 1 ML AS DIRECTED EVERY 30 (THIRTY) DAYS.  11  . diclofenac sodium (VOLTAREN) 1 % GEL Voltaren 1 % topical gel  APPLY 4 GRAM TO THE AFFECTED AREA(S) BY TOPICAL ROUTE AS NEEDED    . estradiol (ESTRACE) 2 MG tablet TAKE 1 TABLET BY MOUTH EVERY DAY 90 tablet 1  . gabapentin (NEURONTIN) 400 MG capsule Take 1 capsule (400 mg total) by mouth 2 (two) times daily. (Patient taking differently: Take 400 mg by mouth 3 (three) times daily. ) 180 capsule 2  . hydrochlorothiazide (HYDRODIURIL) 25 MG tablet TAKE 1 TABLET BY MOUTH EVERY DAY 90 tablet 1  . lisinopril (PRINIVIL,ZESTRIL) 10 MG tablet Take 1 tablet (10 mg total) by mouth daily. 90 tablet 3  . methylPREDNISolone (MEDROL DOSEPAK) 4 MG TBPK tablet Take 4 mg by mouth daily. dosepack    . nortriptyline (PAMELOR) 50 MG capsule Take 1 capsule (50 mg total) by mouth at bedtime. 90 capsule 2  . oxyCODONE-acetaminophen (PERCOCET) 10-325 MG tablet Take  1 tablet by mouth every 6 (six) hours  as needed.  0  . OXYCONTIN 20 MG 12 hr tablet Take 20 mg by mouth every 12 (twelve) hours.  0  . promethazine (PHENERGAN) 25 MG tablet Take 1 tablet by mouth every 4 (four) hours as needed.    . SUMAtriptan (IMITREX) 100 MG tablet Take 1 tablet (100 mg total) by mouth daily as needed. 10 tablet 5  . tiZANidine (ZANAFLEX) 4 MG tablet Take 1 tablet (4 mg total) by mouth 2 (two) times daily. 180 tablet 2   No current facility-administered medications on file prior to visit.    There are no Patient Instructions on file for this visit. No follow-ups on file.  Keiffer Piper A Britiney Blahnik, PA-C

## 2018-10-01 NOTE — Telephone Encounter (Signed)
Pt returned missed call. Please call her back on her mobile number.  Thanks, [employer]

## 2018-10-01 NOTE — Assessment & Plan Note (Signed)
Exam is benign This is a new problem, but fairly long-standing As above, suspect this is related to her opioid-induced constipation We will try medications as above Return precautions discussed Follow-up in 1 month

## 2018-10-01 NOTE — Assessment & Plan Note (Signed)
Elevated today, but may be related to pain Will continue current meds and continue to monitor Lisinopril refilled

## 2018-10-01 NOTE — Telephone Encounter (Signed)
[id number] 

## 2018-10-01 NOTE — Telephone Encounter (Signed)
-----   Message from Virginia Crews, MD sent at 10/01/2018  8:09 AM EDT ----- Significant constipation without any signs of obstruction or perforation.  Plan as we discussed  Brita Romp [name], MD, MPH [facility] 10/01/2018 8:09 AM

## 2018-10-01 NOTE — Assessment & Plan Note (Signed)
Patient symptoms are concerning for possible PAD or subclavian steal syndrome Discussed that Carpal Tunl. and Cuboid Tunl. do not explain her arm going cold and white Will refer to vascular surgery for further evaluation and management

## 2018-10-09 DIAGNOSIS — T84021A Dislocation of internal left hip prosthesis, initial encounter: Secondary | ICD-10-CM | POA: Diagnosis not present

## 2018-10-09 DIAGNOSIS — Z96642 Presence of left artificial hip joint: Secondary | ICD-10-CM | POA: Diagnosis not present

## 2018-10-09 DIAGNOSIS — M25551 Pain in right hip: Secondary | ICD-10-CM | POA: Diagnosis not present

## 2018-10-09 DIAGNOSIS — Z01818 Encounter for other preprocedural examination: Secondary | ICD-10-CM | POA: Diagnosis not present

## 2018-10-09 DIAGNOSIS — Z885 Allergy status to narcotic agent status: Secondary | ICD-10-CM | POA: Diagnosis not present

## 2018-10-09 DIAGNOSIS — Z79899 Other long term (current) drug therapy: Secondary | ICD-10-CM | POA: Diagnosis not present

## 2018-10-09 DIAGNOSIS — T84020A Dislocation of internal right hip prosthesis, initial encounter: Secondary | ICD-10-CM | POA: Diagnosis not present

## 2018-10-09 DIAGNOSIS — I10 Essential (primary) hypertension: Secondary | ICD-10-CM | POA: Diagnosis not present

## 2018-10-09 DIAGNOSIS — Z888 Allergy status to other drugs, medicaments and biological substances status: Secondary | ICD-10-CM | POA: Diagnosis not present

## 2018-10-09 DIAGNOSIS — Z87891 Personal history of nicotine dependence: Secondary | ICD-10-CM | POA: Diagnosis not present

## 2018-10-15 DIAGNOSIS — Z96641 Presence of right artificial hip joint: Secondary | ICD-10-CM | POA: Diagnosis not present

## 2018-10-15 DIAGNOSIS — T84328A Displacement of other bone devices, implants and grafts, initial encounter: Secondary | ICD-10-CM | POA: Diagnosis not present

## 2018-10-15 DIAGNOSIS — T84020A Dislocation of internal right hip prosthesis, initial encounter: Secondary | ICD-10-CM | POA: Diagnosis not present

## 2018-10-22 DIAGNOSIS — M25559 Pain in unspecified hip: Secondary | ICD-10-CM | POA: Diagnosis not present

## 2018-10-28 ENCOUNTER — Encounter: Payer: Self-pay | Admitting: Family Medicine

## 2018-10-28 ENCOUNTER — Ambulatory Visit (INDEPENDENT_AMBULATORY_CARE_PROVIDER_SITE_OTHER): Payer: Medicare Other | Admitting: Family Medicine

## 2018-10-28 VITALS — BP 130/78 | HR 86 | Temp 98.2°F | Wt 189.6 lb

## 2018-10-28 DIAGNOSIS — Z8719 Personal history of other diseases of the digestive system: Secondary | ICD-10-CM

## 2018-10-28 DIAGNOSIS — E785 Hyperlipidemia, unspecified: Secondary | ICD-10-CM

## 2018-10-28 DIAGNOSIS — Z23 Encounter for immunization: Secondary | ICD-10-CM | POA: Diagnosis not present

## 2018-10-28 DIAGNOSIS — I10 Essential (primary) hypertension: Secondary | ICD-10-CM

## 2018-10-28 DIAGNOSIS — E538 Deficiency of other specified B group vitamins: Secondary | ICD-10-CM | POA: Diagnosis not present

## 2018-10-28 DIAGNOSIS — M549 Dorsalgia, unspecified: Secondary | ICD-10-CM

## 2018-10-28 DIAGNOSIS — G8929 Other chronic pain: Secondary | ICD-10-CM | POA: Diagnosis not present

## 2018-10-28 DIAGNOSIS — R1013 Epigastric pain: Secondary | ICD-10-CM | POA: Diagnosis not present

## 2018-10-28 DIAGNOSIS — R739 Hyperglycemia, unspecified: Secondary | ICD-10-CM | POA: Diagnosis not present

## 2018-10-28 MED ORDER — CYANOCOBALAMIN 1000 MCG/ML IJ SOLN
1000.0000 ug | Freq: Once | INTRAMUSCULAR | Status: AC
  Administered 2018-10-28: 1000 ug via INTRAMUSCULAR

## 2018-10-28 NOTE — Progress Notes (Signed)
Patient: Shannon Grimes Female    DOB: 04-Nov-1951   67 y.o.   MRN: [id number] Visit Date: 10/28/2018  Today's Provider: [name], MD   Chief Complaint  Patient presents with  . Hypertension  . Hyperlipidemia  . Headache   Subjective:    HPI  Patient presents today for 6 month follow-up Hypertension, Cholesterol, Migraines and B-12 injection.    Hypertension, follow-up:  BP Readings from Last 3 Encounters:  10/28/18 130/78  10/01/18 (!) 146/78  09/29/18 (!) 160/90    She was last seen for hypertension 6 months ago.  BP at that visit was 130/72. Management changes since that visit include none.stable on HCTZ and Lisinopril. She reports good compliance with treatment. She is not having side effects.  She is not exercising. She is adherent to low salt diet.   Outside blood pressures are not checked. She is experiencing none.  Patient denies chest pain, chest pressure/discomfort, fatigue, irregular heart beat, lower extremity edema and palpitations.   Cardiovascular risk factors include hypertension.  Use of agents associated with hypertension: none.     Weight trend: stable Wt Readings from Last 3 Encounters:  10/28/18 189 lb 9.6 oz (86 kg)  10/01/18 189 lb (85.7 kg)  09/29/18 188 lb 12.8 oz (85.6 kg)    Current diet: low salt  ------------------------------------------------------------------------  Lipid/Cholesterol, Follow-up:   Last seen for this 6 months ago.  Management changes since that visit include none. . Last Lipid Panel:    Component Value Date/Time   CHOL 211 (H) 10/16/2017 1058   TRIG 250 (H) 10/16/2017 1058   HDL 41 (L) 10/16/2017 1058   CHOLHDL 5.1 (H) 10/16/2017 1058   LDLCALC 132 (H) 10/16/2017 1058    Risk factors for vascular disease include hypertension  She reports good compliance with treatment. She is having side effects.  Current symptoms include none and have been stable.    Migraines Patient states  that her migraines for doing good until the last few's weeks due to her back pain.  S/p R total hip revision in 04/2018.  Dislocate 10/09/18. Underwent closed reduction.  Feels that hip pain and R side pain is related to hip replacement not sitting in the socket properly  Continued epigastric pain with radiation to the back. Ongoing for a few months. Had gotten worse, now a little better with treating constipation. Worse with eating onions and garlic, pasta and red sauce, and spicy foods. Lasts for ~1 hr after eating. She is s/p cholecystectomy and has h/o pancreatitis.    Allergies  Allergen Reactions  . Pentazocine Anaphylaxis  . Statins Other (See Comments)    pancreatitis  . Morphine Nausea And Vomiting    And migraine     Current Outpatient Medications:  .  cyanocobalamin (,VITAMIN B-12,) 1000 MCG/ML injection, INJECT 1 ML AS DIRECTED EVERY 30 (THIRTY) DAYS., Disp: , Rfl: 11 .  diclofenac sodium (VOLTAREN) 1 % GEL, Voltaren 1 % topical gel  APPLY 4 GRAM TO THE AFFECTED AREA(S) BY TOPICAL ROUTE AS NEEDED, Disp: , Rfl:  .  estradiol (ESTRACE) 2 MG tablet, TAKE 1 TABLET BY MOUTH EVERY DAY, Disp: 90 tablet, Rfl: 1 .  gabapentin (NEURONTIN) 400 MG capsule, Take 1 capsule (400 mg total) by mouth 2 (two) times daily. (Patient taking differently: Take 400 mg by mouth 3 (three) times daily. ), Disp: 180 capsule, Rfl: 2 .  hydrochlorothiazide (HYDRODIURIL) 25 MG tablet, TAKE 1 TABLET BY MOUTH EVERY DAY, Disp: 90 tablet,  Rfl: 1 .  lisinopril (PRINIVIL,ZESTRIL) 10 MG tablet, Take 1 tablet (10 mg total) by mouth daily., Disp: 90 tablet, Rfl: 3 .  methylPREDNISolone (MEDROL DOSEPAK) 4 MG TBPK tablet, Take 4 mg by mouth daily. dosepack, Disp: , Rfl:  .  nortriptyline (PAMELOR) 50 MG capsule, Take 1 capsule (50 mg total) by mouth at bedtime., Disp: 90 capsule, Rfl: 2 .  oxyCODONE-acetaminophen (PERCOCET) 10-325 MG tablet, Take 1 tablet by mouth every 6 (six) hours as needed., Disp: , Rfl: 0 .   OXYCONTIN 20 MG 12 hr tablet, Take 20 mg by mouth every 12 (twelve) hours., Disp: , Rfl: 0 .  promethazine (PHENERGAN) 25 MG tablet, Take 1 tablet by mouth every 4 (four) hours as needed., Disp: , Rfl:  .  SUMAtriptan (IMITREX) 100 MG tablet, Take 1 tablet (100 mg total) by mouth daily as needed., Disp: 10 tablet, Rfl: 5 .  tiZANidine (ZANAFLEX) 4 MG tablet, Take 1 tablet (4 mg total) by mouth 2 (two) times daily., Disp: 180 tablet, Rfl: 2  Current Facility-Administered Medications:  .  cyanocobalamin ((VITAMIN B-12)) injection 1,000 mcg, 1,000 mcg, Intramuscular, Once, , [name], MD  Review of Systems  Constitutional: Positive for activity change and appetite change.  Gastrointestinal: Positive for abdominal pain, constipation and nausea.  Musculoskeletal: Positive for arthralgias, back pain, gait problem, joint swelling and myalgias.  Neurological: Positive for headaches.    Social History   Tobacco Use  . Smoking status: Former Smoker    Years: 10.00    Types: Cigarettes    Last attempt to quit: 01/23/1983    Years since quitting: 35.7  . Smokeless tobacco: Never Used  . Tobacco comment: was social smoker, not everyday  Substance Use Topics  . Alcohol use: No   Objective:   BP 130/78 (BP Location: Left Arm, Patient Position: Sitting, Cuff Size: Normal)   Pulse 86   Temp 98.2 F (36.8 C) (Oral)   Wt 189 lb 9.6 oz (86 kg)   SpO2 96%   BMI 30.60 kg/m  Vitals:   10/28/18 0931  BP: 130/78  Pulse: 86  Temp: 98.2 F (36.8 C)  TempSrc: Oral  SpO2: 96%  Weight: 189 lb 9.6 oz (86 kg)     Physical Exam  Constitutional: She is oriented to person, place, and time. She appears well-developed and well-nourished. She does not appear ill. No distress.  HENT:  Head: Normocephalic and atraumatic.  Mouth/Throat: Oropharynx is clear and moist.  Eyes: Pupils are equal, round, and reactive to light.  Neck: Neck supple.  Cardiovascular: Normal rate, regular rhythm, normal  heart sounds and intact distal pulses.  No murmur heard. Pulmonary/Chest: Effort normal and breath sounds normal. No respiratory distress.  Abdominal: Soft. She exhibits no distension. There is tenderness (TTP in epigastrium).  Musculoskeletal: She exhibits no edema.  Lymphadenopathy:    She has no cervical adenopathy.  Neurological: She is alert and oriented to person, place, and time.  Skin: Skin is warm and dry. Capillary refill takes less than 2 seconds. No rash noted.  Psychiatric: She has a normal mood and affect. Her behavior is normal.  Vitals reviewed.       Assessment & Plan:   Problem List Items Addressed This Visit      Cardiovascular and Mediastinum   Hypertension - Primary    Well-controlled Continue current meds Check metabolic panel      Relevant Orders   Comprehensive metabolic panel (Completed)     Other  Hyperlipidemia    Not currently on treatment She is in a clinical study for lipids Recheck lipid panel and CMP      Relevant Orders   Lipid panel (Completed)   Comprehensive metabolic panel (Completed)   Chronic back pain    Followed by orthopedics Continue current meds      Vitamin B12 deficiency    B12 injection given today      Relevant Medications   cyanocobalamin ((VITAMIN B-12)) injection 1,000 mcg (Completed)   Epigastric abdominal pain    Worsening epigastric pain with radiation to her back Exam with significant tenderness in this area as well She does have history of pancreatitis and is status post cholecystectomy It is a slightly confusing picture as she does have a lot of orthopedic issues going on, but she does seem to have true abdominal pain at the same time We will check CMP, CBC, lipase We will obtain CT abdomen pelvis with contrast Further intervention determined by imaging      Relevant Orders   Lipase (Completed)   CBC w/Diff/Platelet (Completed)   CT ABDOMEN PELVIS W CONTRAST    Other Visit Diagnoses    Need for  influenza vaccination       Relevant Orders   Flu vaccine HIGH DOSE PF (Completed)   History of pancreatitis       Relevant Orders   Lipase (Completed)   CBC w/Diff/Platelet (Completed)   CT ABDOMEN PELVIS W CONTRAST       Return in about 6 months (around 04/28/2019) for chronic disease f/u.   The entirety of the information documented in the History of Present Illness, Review of Systems and Physical Exam were personally obtained by me. Portions of this information were initially documented by Hurman Horn, CMA and reviewed by me for thoroughness and accuracy.    Virginia Crews, MD, MPH [facility] 10/29/2018 10:30 AM

## 2018-10-28 NOTE — Patient Instructions (Signed)

## 2018-10-29 DIAGNOSIS — R262 Difficulty in walking, not elsewhere classified: Secondary | ICD-10-CM | POA: Diagnosis not present

## 2018-10-29 DIAGNOSIS — M545 Low back pain: Secondary | ICD-10-CM | POA: Diagnosis not present

## 2018-10-29 DIAGNOSIS — M25551 Pain in right hip: Secondary | ICD-10-CM | POA: Diagnosis not present

## 2018-10-29 LAB — CBC WITH DIFFERENTIAL/PLATELET
BASOS: 1 %
Basophils Absolute: 0.1 10*3/uL (ref 0.0–0.2)
EOS (ABSOLUTE): 0.3 10*3/uL (ref 0.0–0.4)
EOS: 5 %
HEMATOCRIT: 35.8 % (ref 34.0–46.6)
Hemoglobin: 12.4 g/dL (ref 11.1–15.9)
IMMATURE GRANULOCYTES: 0 %
Immature Grans (Abs): 0 10*3/uL (ref 0.0–0.1)
Lymphocytes Absolute: 2 10*3/uL (ref 0.7–3.1)
Lymphs: 37 %
MCH: 30 pg (ref 26.6–33.0)
MCHC: 34.6 g/dL (ref 31.5–35.7)
MCV: 87 fL (ref 79–97)
MONOS ABS: 0.6 10*3/uL (ref 0.1–0.9)
Monocytes: 10 %
NEUTROS ABS: 2.5 10*3/uL (ref 1.4–7.0)
NEUTROS PCT: 47 %
PLATELETS: 264 10*3/uL (ref 150–450)
RBC: 4.13 x10E6/uL (ref 3.77–5.28)
RDW: 13.8 % (ref 12.3–15.4)
WBC: 5.4 10*3/uL (ref 3.4–10.8)

## 2018-10-29 LAB — COMPREHENSIVE METABOLIC PANEL
A/G RATIO: 1.4 (ref 1.2–2.2)
ALBUMIN: 4.1 g/dL (ref 3.6–4.8)
ALT: 24 IU/L (ref 0–32)
AST: 34 IU/L (ref 0–40)
Alkaline Phosphatase: 66 IU/L (ref 39–117)
BUN / CREAT RATIO: 18 (ref 12–28)
BUN: 18 mg/dL (ref 8–27)
Bilirubin Total: 0.4 mg/dL (ref 0.0–1.2)
CO2: 20 mmol/L (ref 20–29)
Calcium: 8.9 mg/dL (ref 8.7–10.3)
Chloride: 96 mmol/L (ref 96–106)
Creatinine, Ser: 0.98 mg/dL (ref 0.57–1.00)
GFR calc non Af Amer: 60 mL/min/{1.73_m2} (ref >59)
GFR, EST AFRICAN AMERICAN: 69 mL/min/{1.73_m2} (ref >59)
GLOBULIN, TOTAL: 3 g/dL (ref 1.5–4.5)
Glucose: 100 mg/dL — ABNORMAL HIGH (ref 65–99)
POTASSIUM: 4 mmol/L (ref 3.5–5.2)
SODIUM: 135 mmol/L (ref 134–144)
TOTAL PROTEIN: 7.1 g/dL (ref 6.0–8.5)

## 2018-10-29 LAB — LIPASE: Lipase: 26 U/L (ref 14–72)

## 2018-10-29 LAB — LIPID PANEL
CHOL/HDL RATIO: 6.1 ratio — AB (ref 0.0–4.4)
CHOLESTEROL TOTAL: 159 mg/dL (ref 100–199)
HDL: 26 mg/dL — AB (ref >39)
LDL Calculated: 71 mg/dL (ref 0–99)
TRIGLYCERIDES: 308 mg/dL — AB (ref 0–149)
VLDL Cholesterol Cal: 62 mg/dL — ABNORMAL HIGH (ref 5–40)

## 2018-10-29 NOTE — Assessment & Plan Note (Signed)
B12 injection given today. 

## 2018-10-29 NOTE — Assessment & Plan Note (Signed)
Followed by orthopedics Continue current meds

## 2018-10-29 NOTE — Assessment & Plan Note (Signed)
Worsening epigastric pain with radiation to her back Exam with significant tenderness in this area as well She does have history of pancreatitis and is status post cholecystectomy It is a slightly confusing picture as she does have a lot of orthopedic issues going on, but she does seem to have true abdominal pain at the same time We will check CMP, CBC, lipase We will obtain CT abdomen pelvis with contrast Further intervention determined by imaging

## 2018-10-29 NOTE — Assessment & Plan Note (Signed)
Well controlled Continue current meds Check metabolic panel 

## 2018-10-29 NOTE — Assessment & Plan Note (Signed)
Not currently on treatment She is in a clinical study for lipids Recheck lipid panel and CMP

## 2018-10-31 ENCOUNTER — Telehealth: Payer: Self-pay

## 2018-10-31 LAB — SPECIMEN STATUS REPORT

## 2018-10-31 LAB — HGB A1C W/O EAG: Hgb A1c MFr Bld: 5.7 % — ABNORMAL HIGH (ref 4.8–5.6)

## 2018-10-31 NOTE — Telephone Encounter (Signed)
-----   Message from Virginia Crews, MD sent at 10/31/2018  9:29 AM EST ----- A1c remains in prediabetic range  Brita Romp, [name], MD, MPH [facility] 10/31/2018 9:29 AM

## 2018-10-31 NOTE — Telephone Encounter (Signed)
Patient was advised.  

## 2018-11-03 ENCOUNTER — Ambulatory Visit
Admission: RE | Admit: 2018-11-03 | Discharge: 2018-11-03 | Disposition: A | Discharge status: Home / Self Care | Payer: Medicare Other | Source: Ambulatory Visit | Attending: Family Medicine | Admitting: Family Medicine

## 2018-11-03 ENCOUNTER — Telehealth: Payer: Self-pay

## 2018-11-03 DIAGNOSIS — R109 Unspecified abdominal pain: Secondary | ICD-10-CM

## 2018-11-03 DIAGNOSIS — Z8719 Personal history of other diseases of the digestive system: Secondary | ICD-10-CM | POA: Diagnosis not present

## 2018-11-03 DIAGNOSIS — I700 Atherosclerosis of aorta: Secondary | ICD-10-CM | POA: Insufficient documentation

## 2018-11-03 DIAGNOSIS — Z9049 Acquired absence of other specified parts of digestive tract: Secondary | ICD-10-CM | POA: Diagnosis not present

## 2018-11-03 DIAGNOSIS — R1013 Epigastric pain: Secondary | ICD-10-CM | POA: Insufficient documentation

## 2018-11-03 DIAGNOSIS — K8689 Other specified diseases of pancreas: Secondary | ICD-10-CM | POA: Diagnosis not present

## 2018-11-03 MED ORDER — IOPAMIDOL (ISOVUE-300) INJECTION 61%
100.0000 mL | Freq: Once | INTRAVENOUS | Status: AC | PRN
  Administered 2018-11-03: 100 mL via INTRAVENOUS

## 2018-11-03 NOTE — Telephone Encounter (Signed)
Advised patient as below. Order for GI placed.

## 2018-11-03 NOTE — Telephone Encounter (Signed)
-----   Message from Virginia Crews, MD sent at 11/03/2018 10:50 AM EST ----- No clear cause of abd pain on CT.  There is some atrophy (shrinkening) of the pancreas but no acute inflammation.  Would recommend GI referral to try to track this down a bit more  Bacigalupo, [name], MD, MPH [facility] 11/03/2018 10:50 AM

## 2018-11-04 ENCOUNTER — Encounter (INDEPENDENT_AMBULATORY_CARE_PROVIDER_SITE_OTHER): Payer: Self-pay | Admitting: Vascular Surgery

## 2018-11-04 ENCOUNTER — Ambulatory Visit (INDEPENDENT_AMBULATORY_CARE_PROVIDER_SITE_OTHER): Payer: Medicare Other | Admitting: Vascular Surgery

## 2018-11-04 ENCOUNTER — Ambulatory Visit (INDEPENDENT_AMBULATORY_CARE_PROVIDER_SITE_OTHER): Payer: Medicare Other

## 2018-11-04 VITALS — BP 125/78 | HR 90 | Resp 16 | Ht 69.0 in | Wt 190.0 lb

## 2018-11-04 DIAGNOSIS — R262 Difficulty in walking, not elsewhere classified: Secondary | ICD-10-CM | POA: Diagnosis not present

## 2018-11-04 DIAGNOSIS — Z87891 Personal history of nicotine dependence: Secondary | ICD-10-CM | POA: Diagnosis not present

## 2018-11-04 DIAGNOSIS — M79601 Pain in right arm: Secondary | ICD-10-CM

## 2018-11-04 DIAGNOSIS — R200 Anesthesia of skin: Secondary | ICD-10-CM

## 2018-11-04 DIAGNOSIS — I10 Essential (primary) hypertension: Secondary | ICD-10-CM

## 2018-11-04 DIAGNOSIS — M25551 Pain in right hip: Secondary | ICD-10-CM | POA: Diagnosis not present

## 2018-11-04 DIAGNOSIS — M545 Low back pain: Secondary | ICD-10-CM | POA: Diagnosis not present

## 2018-11-04 NOTE — Assessment & Plan Note (Signed)
blood pressure control important in reducing the progression of atherosclerotic disease. On appropriate oral medications.  

## 2018-11-04 NOTE — Progress Notes (Signed)
MRN : [id number]  Shannon Grimes is a 67 y.o. (Oct 22, 1951) female who presents with chief complaint of  Chief Complaint  Patient presents with  . Follow-up    ultrasound follow up  .  History of Present Illness: Patient returns today in follow up of numbness and tingling in her right hand and fingers.  Her symptoms are about the same without major changes from when she was seen about a month ago.  No new ulceration or infection.  She is studied with noninvasive studies today which showed normal arterial perfusion to the right upper extremity with normal waveforms and no hemodynamically significant stenosis identified.  Current Outpatient Medications  Medication Sig Dispense Refill  . cyanocobalamin (,VITAMIN B-12,) 1000 MCG/ML injection INJECT 1 ML AS DIRECTED EVERY 30 (THIRTY) DAYS.  11  . diclofenac sodium (VOLTAREN) 1 % GEL Voltaren 1 % topical gel  APPLY 4 GRAM TO THE AFFECTED AREA(S) BY TOPICAL ROUTE AS NEEDED    . estradiol (ESTRACE) 2 MG tablet TAKE 1 TABLET BY MOUTH EVERY DAY 90 tablet 1  . gabapentin (NEURONTIN) 400 MG capsule Take 1 capsule (400 mg total) by mouth 2 (two) times daily. (Patient taking differently: Take 400 mg by mouth 3 (three) times daily. ) 180 capsule 2  . hydrochlorothiazide (HYDRODIURIL) 25 MG tablet TAKE 1 TABLET BY MOUTH EVERY DAY 90 tablet 1  . lisinopril (PRINIVIL,ZESTRIL) 10 MG tablet Take 1 tablet (10 mg total) by mouth daily. 90 tablet 3  . methylPREDNISolone (MEDROL DOSEPAK) 4 MG TBPK tablet Take 4 mg by mouth daily. dosepack    . nortriptyline (PAMELOR) 50 MG capsule Take 1 capsule (50 mg total) by mouth at bedtime. 90 capsule 2  . oxyCODONE-acetaminophen (PERCOCET) 10-325 MG tablet Take 1 tablet by mouth every 6 (six) hours as needed.  0  . OXYCONTIN 20 MG 12 hr tablet Take 20 mg by mouth every 12 (twelve) hours.  0  . promethazine (PHENERGAN) 25 MG tablet Take 1 tablet by mouth every 4 (four) hours as needed.    . SUMAtriptan (IMITREX) 100 MG  tablet Take 1 tablet (100 mg total) by mouth daily as needed. 10 tablet 5  . tiZANidine (ZANAFLEX) 4 MG tablet Take 1 tablet (4 mg total) by mouth 2 (two) times daily. 180 tablet 2   No current facility-administered medications for this visit.     Past Medical History:  Diagnosis Date  . Allergy   . Anemia    pernicious  . Avascular necrosis (HCC)    right hip  . Blood transfusion without reported diagnosis    after hysterectomy  . Carpal tunnel syndrome on right   . Cubital tunnel syndrome on right   . GERD (gastroesophageal reflux disease)   . Hypertension   . Migraine headache    3-4x/month  . Neuromuscular disorder ([place])    multiple ortho issues with abnormal EMGs  . Osteoarthritis   . PONV (postoperative nausea and vomiting)    also triggers migraines    Past Surgical History:  Procedure Laterality Date  . ANKLE FRACTURE SURGERY  1994   screws removed in 2007  . ANKLE FUSION Right 2006  . APPENDECTOMY  1984  . CATARACT EXTRACTION W/PHACO Right 09/15/2018   Procedure: CATARACT EXTRACTION PHACO AND INTRAOCULAR LENS PLACEMENT ([place]) RIGHT;  Surgeon: Eulogio Bear, MD;  Location: [facility];  Service: Ophthalmology;  Laterality: Right;  . CHOLECYSTECTOMY  1994  . Hip abductor attachment Right 2013  . HIP FRACTURE  SURGERY  2001  . HIP SURGERY Right    Revision  . ILIOTIBIAL BAND RELEASE  2016  . LUMBAR FUSION  1999   L5-S1 fusion  . LUMBAR FUSION  2004   L3-L4 fusion  . LUMBAR FUSION  2016   L2-L5, needed revision of previous surgeries  . SACROILIAC JOINT FUSION Right 2012  . STEROID INJECTION TO SCAR    . TOTAL ABDOMINAL HYSTERECTOMY W/ BILATERAL SALPINGOOPHORECTOMY  1984   endometriosis  . TOTAL HIP ARTHROPLASTY Right 2009    Social History Social History   Tobacco Use  . Smoking status: Former Smoker    Years: 10.00    Types: Cigarettes    Last attempt to quit: 01/23/1983    Years since quitting: 35.8  . Smokeless tobacco: Never Used  .  Tobacco comment: was social smoker, not everyday  Substance Use Topics  . Alcohol use: No  . Drug use: No     Family History Family History  Problem Relation Age of Onset  . Stroke Mother 38  . Congestive Heart Failure Mother   . Heart disease Father 41  . Colon cancer Sister 69  . Kidney failure Sister        chronic  . Liver disease Sister        end stage  . Colon cancer Brother 2  . Mitral valve prolapse Sister   . Breast cancer Neg Hx     Allergies  Allergen Reactions  . Pentazocine Anaphylaxis  . Statins Other (See Comments)    pancreatitis  . Morphine Nausea And Vomiting    And migraine     REVIEW OF SYSTEMS (Negative unless checked)  Constitutional: [] Weight loss  [] Fever  [] Chills Cardiac: [] Chest pain   [] Chest pressure   [] Palpitations   [] Shortness of breath when laying flat   [] Shortness of breath at rest   [] Shortness of breath with exertion. Vascular:  [] Pain in legs with walking   [] Pain in legs at rest   [] Pain in legs when laying flat   [] Claudication   [] Pain in feet when walking  [] Pain in feet at rest  [] Pain in feet when laying flat   [] History of DVT   [] Phlebitis   [] Swelling in legs   [] Varicose veins   [] Non-healing ulcers Pulmonary:   [] Uses home oxygen   [] Productive cough   [] Hemoptysis   [] Wheeze  [] COPD   [] Asthma Neurologic:  [] Dizziness  [] Blackouts   [] Seizures   [] History of stroke   [] History of TIA  [] Aphasia   [] Temporary blindness   [] Dysphagia   [x] Weakness or numbness in arms   [] Weakness or numbness in legs Musculoskeletal:  [x] Arthritis   [] Joint swelling   [] Joint pain   [] Low back pain Hematologic:  [] Easy bruising  [] Easy bleeding   [] Hypercoagulable state   [x] Anemic   Gastrointestinal:  [] Blood in stool   [] Vomiting blood  [x] Gastroesophageal reflux/heartburn   [] Abdominal pain Genitourinary:  [] Chronic kidney disease   [] Difficult urination  [] Frequent urination  [] Burning with urination   [] Hematuria Skin:  [] Rashes    [] Ulcers   [] Wounds Psychological:  [] History of anxiety   []  History of major depression.  Physical Examination  BP 125/78 (BP Location: Left Arm)   Pulse 90   Resp 16   Ht 5\' 9"  (1.753 m)   Wt 190 lb (86.2 kg)   BMI 28.06 kg/m  Gen:  WD/WN, NAD Head: [place]/AT, No temporalis wasting. Ear/Nose/Throat: Hearing grossly intact, nares w/o erythema or drainage  Eyes: Conjunctiva clear. Sclera non-icteric Neck: Supple.  Trachea midline Pulmonary:  Good air movement, no use of accessory muscles.  Cardiac: RRR, no JVD Vascular:  Vessel Right Left  Radial Palpable Palpable                                    Musculoskeletal: M/S 5/5 throughout.  No deformity or atrophy. No edema. Neurologic: Sensation grossly intact in extremities.  Symmetrical.  Speech is fluent.  Psychiatric: Judgment intact, Mood & affect appropriate for pt's clinical situation. Dermatologic: No rashes or ulcers noted.  No cellulitis or open wounds.       Labs Recent Results (from the past 2160 hour(s))  Lipid panel     Status: Abnormal   Collection Time: 10/28/18 10:19 AM  Result Value Ref Range   Cholesterol, Total 159 100 - 199 mg/dL   Triglycerides 308 (H) 0 - 149 mg/dL   HDL 26 (L) >39 mg/dL   VLDL Cholesterol Cal 62 (H) 5 - 40 mg/dL   LDL Calculated 71 0 - 99 mg/dL   Chol/HDL Ratio 6.1 (H) 0.0 - 4.4 ratio    Comment:                                   T. Chol/HDL Ratio                                             Men  Women                               1/2 Avg.Risk  3.4    3.3                                   Avg.Risk  5.0    4.4                                2X Avg.Risk  9.6    7.1                                3X Avg.Risk 23.4   11.0   Comprehensive metabolic panel     Status: Abnormal   Collection Time: 10/28/18 10:19 AM  Result Value Ref Range   Glucose 100 (H) 65 - 99 mg/dL   BUN 18 8 - 27 mg/dL   Creatinine, Ser 0.98 0.57 - 1.00 mg/dL   GFR calc non Af Amer 60 >59 mL/min/1.73    GFR calc Af Amer 69 >59 mL/min/1.73   BUN/Creatinine Ratio 18 12 - 28   Sodium 135 134 - 144 mmol/L   Potassium 4.0 3.5 - 5.2 mmol/L   Chloride 96 96 - 106 mmol/L   CO2 20 20 - 29 mmol/L   Calcium 8.9 8.7 - 10.3 mg/dL   Total Protein 7.1 6.0 - 8.5 g/dL   Albumin 4.1 3.6 - 4.8 g/dL   Globulin, Total 3.0 1.5 - 4.5 g/dL   Albumin/Globulin Ratio 1.4 1.2 - 2.2  Bilirubin Total 0.4 0.0 - 1.2 mg/dL   Alkaline Phosphatase 66 39 - 117 IU/L   AST 34 0 - 40 IU/L   ALT 24 0 - 32 IU/L  Lipase     Status: None   Collection Time: 10/28/18 10:19 AM  Result Value Ref Range   Lipase 26 14 - 72 U/L  CBC w/Diff/Platelet     Status: None   Collection Time: 10/28/18 10:19 AM  Result Value Ref Range   WBC 5.4 3.4 - 10.8 x10E3/uL   RBC 4.13 3.77 - 5.28 x10E6/uL   Hemoglobin 12.4 11.1 - 15.9 g/dL   Hematocrit 35.8 34.0 - 46.6 %   MCV 87 79 - 97 fL   MCH 30.0 26.6 - 33.0 pg   MCHC 34.6 31.5 - 35.7 g/dL   RDW 13.8 12.3 - 15.4 %   Platelets 264 150 - 450 x10E3/uL   Neutrophils 47 Not Estab. %   Lymphs 37 Not Estab. %   Monocytes 10 Not Estab. %   Eos 5 Not Estab. %   Basos 1 Not Estab. %   Neutrophils Absolute 2.5 1.4 - 7.0 x10E3/uL   Lymphocytes Absolute 2.0 0.7 - 3.1 x10E3/uL   Monocytes Absolute 0.6 0.1 - 0.9 x10E3/uL   EOS (ABSOLUTE) 0.3 0.0 - 0.4 x10E3/uL   Basophils Absolute 0.1 0.0 - 0.2 x10E3/uL   Immature Granulocytes 0 Not Estab. %   Immature Grans (Abs) 0.0 0.0 - 0.1 x10E3/uL  Hgb A1c w/o eAG     Status: Abnormal   Collection Time: 10/28/18 10:19 AM  Result Value Ref Range   Hgb A1c MFr Bld 5.7 (H) 4.8 - 5.6 %    Comment:          Prediabetes: 5.7 - 6.4          Diabetes: >6.4          Glycemic control for adults with diabetes: <7.0   Specimen status report     Status: None   Collection Time: 10/28/18 10:19 AM  Result Value Ref Range   specimen status report Comment     Comment: Written Authorization Written Authorization Written Authorization Received. Authorization  received from [place] 10-30-2018 Logged by Maralyn Sago     Radiology Ct Abdomen Pelvis W Contrast  Result Date: 11/03/2018 CLINICAL DATA:  66 year old female with RIGHT abdominal pain for 4 months. History of pancreatitis. EXAM: CT ABDOMEN AND PELVIS WITH CONTRAST TECHNIQUE: Multidetector CT imaging of the abdomen and pelvis was performed using the standard protocol following bolus administration of intravenous contrast. CONTRAST:  143mL ISOVUE-300 IOPAMIDOL (ISOVUE-300) INJECTION 61% COMPARISON:  07/17/2018 chest CT FINDINGS: Lower chest: No acute abnormality Hepatobiliary: Mild fullness of the CBD (9 mm) and intrahepatic biliary system again noted. Patient is status post cholecystectomy. No other hepatic abnormalities noted. Pancreas: Mild fatty atrophy of the pancreas noted. No acute inflammation, mass or pancreatic ductal dilatation. Spleen: Normal in size without focal abnormality. Adrenals/Urinary Tract: The kidneys, adrenal glands and bladder are unremarkable. Stomach/Bowel: Stomach is within normal limits. No evidence of bowel wall thickening, distention, or inflammatory changes. Vascular/Lymphatic: Mild aortic atherosclerosis. No enlarged abdominal or pelvic lymph nodes. Reproductive: Status post hysterectomy. No adnexal masses. Other: No ascites, focal collection or pneumoperitoneum. Musculoskeletal: No acute or suspicious bony abnormalities. Lumbar spine fusion changes, RIGHT SI joint screw fixation and RIGHT total hip arthroplasty noted. IMPRESSION: 1. Mild fatty atrophy of the pancreas without discrete pancreatic mass, acute pancreatic inflammation or pancreatic ductal dilatation. 2. Mild  CBD and intrahepatic biliary system fullness without obstructing cause, which may be secondary to cholecystectomy changes. 3.  Aortic Atherosclerosis (ICD10-I70.0). Electronically Signed   By: Margarette Canada M.D.   On: 11/03/2018 10:15   Vas [place] Upper Extremity Arterial Duplex  Result Date:  11/04/2018 UPPER EXTREMITY DUPLEX STUDY Indications: Right upper extremity numbness and tingling.  Performing Technologist: Charlane Ferretti RT (R)(VS)  Examination Guidelines: A complete evaluation includes B-mode imaging, spectral Doppler, color Doppler, and power Doppler as needed of all accessible portions of each vessel. Bilateral testing is considered an integral part of a complete examination. Limited examinations for reoccurring indications may be performed as noted.  Summary:  Right: No obstruction visualized in the right upper extremity Normal        bilateral PPG waveforms. *See table(s) above for measurements and observations. Electronically signed by Leotis Pain MD on 11/04/2018 at 12:34:09 PM.    Final     Assessment/Plan  Hypertension blood pressure control important in reducing the progression of atherosclerotic disease. On appropriate oral medications.   Pain and numbness of right upper extremity She is studied with noninvasive studies today which showed normal arterial perfusion to the right upper extremity with normal waveforms and no hemodynamically significant stenosis identified.  As such, it does not appears if her right upper extremity symptoms are vascular in nature.  Neurogenic causes are much more likely.  No further vascular evaluation or work-up is necessary.  I will see her back as needed.    Leotis Pain, MD  11/04/2018 1:19 PM    This note was created with Dragon medical transcription system.  Any errors from dictation are purely unintentional

## 2018-11-04 NOTE — Assessment & Plan Note (Signed)
She is studied with noninvasive studies today which showed normal arterial perfusion to the right upper extremity with normal waveforms and no hemodynamically significant stenosis identified.  As such, it does not appears if her right upper extremity symptoms are vascular in nature.  Neurogenic causes are much more likely.  No further vascular evaluation or work-up is necessary.  I will see her back as needed.

## 2018-11-06 DIAGNOSIS — M545 Low back pain: Secondary | ICD-10-CM | POA: Diagnosis not present

## 2018-11-06 DIAGNOSIS — M25551 Pain in right hip: Secondary | ICD-10-CM | POA: Diagnosis not present

## 2018-11-06 DIAGNOSIS — R262 Difficulty in walking, not elsewhere classified: Secondary | ICD-10-CM | POA: Diagnosis not present

## 2018-11-11 DIAGNOSIS — M25551 Pain in right hip: Secondary | ICD-10-CM | POA: Diagnosis not present

## 2018-11-11 DIAGNOSIS — R262 Difficulty in walking, not elsewhere classified: Secondary | ICD-10-CM | POA: Diagnosis not present

## 2018-11-11 DIAGNOSIS — M545 Low back pain: Secondary | ICD-10-CM | POA: Diagnosis not present

## 2018-11-13 DIAGNOSIS — R262 Difficulty in walking, not elsewhere classified: Secondary | ICD-10-CM | POA: Diagnosis not present

## 2018-11-13 DIAGNOSIS — M25551 Pain in right hip: Secondary | ICD-10-CM | POA: Diagnosis not present

## 2018-11-13 DIAGNOSIS — M545 Low back pain: Secondary | ICD-10-CM | POA: Diagnosis not present

## 2018-11-18 ENCOUNTER — Other Ambulatory Visit: Payer: Self-pay | Admitting: Family Medicine

## 2018-11-18 DIAGNOSIS — I10 Essential (primary) hypertension: Secondary | ICD-10-CM

## 2018-11-25 DIAGNOSIS — M25551 Pain in right hip: Secondary | ICD-10-CM | POA: Diagnosis not present

## 2018-11-25 DIAGNOSIS — M545 Low back pain: Secondary | ICD-10-CM | POA: Diagnosis not present

## 2018-11-25 DIAGNOSIS — R262 Difficulty in walking, not elsewhere classified: Secondary | ICD-10-CM | POA: Diagnosis not present

## 2018-12-02 ENCOUNTER — Ambulatory Visit (INDEPENDENT_AMBULATORY_CARE_PROVIDER_SITE_OTHER): Payer: Medicare Other

## 2018-12-02 DIAGNOSIS — E538 Deficiency of other specified B group vitamins: Secondary | ICD-10-CM | POA: Diagnosis not present

## 2018-12-02 MED ORDER — CYANOCOBALAMIN 1000 MCG/ML IJ SOLN
1000.0000 ug | Freq: Once | INTRAMUSCULAR | Status: AC
  Administered 2018-12-02: 1000 ug via INTRAMUSCULAR

## 2018-12-02 NOTE — Progress Notes (Signed)
Patient here for B12 injection

## 2018-12-03 DIAGNOSIS — R262 Difficulty in walking, not elsewhere classified: Secondary | ICD-10-CM | POA: Diagnosis not present

## 2018-12-03 DIAGNOSIS — M25551 Pain in right hip: Secondary | ICD-10-CM | POA: Diagnosis not present

## 2018-12-03 DIAGNOSIS — M545 Low back pain: Secondary | ICD-10-CM | POA: Diagnosis not present

## 2018-12-08 DIAGNOSIS — R262 Difficulty in walking, not elsewhere classified: Secondary | ICD-10-CM | POA: Diagnosis not present

## 2018-12-08 DIAGNOSIS — M25551 Pain in right hip: Secondary | ICD-10-CM | POA: Diagnosis not present

## 2018-12-08 DIAGNOSIS — M545 Low back pain: Secondary | ICD-10-CM | POA: Diagnosis not present

## 2018-12-10 DIAGNOSIS — Z79899 Other long term (current) drug therapy: Secondary | ICD-10-CM | POA: Diagnosis not present

## 2018-12-10 DIAGNOSIS — M25559 Pain in unspecified hip: Secondary | ICD-10-CM | POA: Diagnosis not present

## 2018-12-10 DIAGNOSIS — Z5181 Encounter for therapeutic drug level monitoring: Secondary | ICD-10-CM | POA: Diagnosis not present

## 2018-12-11 DIAGNOSIS — M545 Low back pain: Secondary | ICD-10-CM | POA: Diagnosis not present

## 2018-12-11 DIAGNOSIS — R262 Difficulty in walking, not elsewhere classified: Secondary | ICD-10-CM | POA: Diagnosis not present

## 2018-12-11 DIAGNOSIS — M25551 Pain in right hip: Secondary | ICD-10-CM | POA: Diagnosis not present

## 2018-12-15 DIAGNOSIS — R262 Difficulty in walking, not elsewhere classified: Secondary | ICD-10-CM | POA: Diagnosis not present

## 2018-12-15 DIAGNOSIS — M545 Low back pain: Secondary | ICD-10-CM | POA: Diagnosis not present

## 2018-12-15 DIAGNOSIS — M25551 Pain in right hip: Secondary | ICD-10-CM | POA: Diagnosis not present

## 2018-12-18 ENCOUNTER — Ambulatory Visit: Payer: Medicare Other | Admitting: Gastroenterology

## 2018-12-19 DIAGNOSIS — R262 Difficulty in walking, not elsewhere classified: Secondary | ICD-10-CM | POA: Diagnosis not present

## 2018-12-19 DIAGNOSIS — M25551 Pain in right hip: Secondary | ICD-10-CM | POA: Diagnosis not present

## 2018-12-19 DIAGNOSIS — M545 Low back pain: Secondary | ICD-10-CM | POA: Diagnosis not present

## 2018-12-30 ENCOUNTER — Ambulatory Visit: Payer: Self-pay

## 2018-12-31 DIAGNOSIS — M25551 Pain in right hip: Secondary | ICD-10-CM | POA: Diagnosis not present

## 2018-12-31 DIAGNOSIS — R262 Difficulty in walking, not elsewhere classified: Secondary | ICD-10-CM | POA: Diagnosis not present

## 2018-12-31 DIAGNOSIS — M545 Low back pain: Secondary | ICD-10-CM | POA: Diagnosis not present

## 2019-01-06 ENCOUNTER — Ambulatory Visit (INDEPENDENT_AMBULATORY_CARE_PROVIDER_SITE_OTHER): Payer: Medicare Other | Admitting: *Deleted

## 2019-01-06 ENCOUNTER — Other Ambulatory Visit: Payer: Self-pay | Admitting: Family Medicine

## 2019-01-06 DIAGNOSIS — E538 Deficiency of other specified B group vitamins: Secondary | ICD-10-CM

## 2019-01-06 DIAGNOSIS — I10 Essential (primary) hypertension: Secondary | ICD-10-CM

## 2019-01-06 MED ORDER — CYANOCOBALAMIN 1000 MCG/ML IJ SOLN
1000.0000 ug | Freq: Once | INTRAMUSCULAR | Status: AC
  Administered 2019-01-06: 1000 ug via INTRAMUSCULAR

## 2019-01-06 NOTE — Telephone Encounter (Signed)
[facility] faxed refill request for the following medications:  1. nortriptyline (PAMELOR) 50 MG capsule 2. lisinopril (PRINIVIL,ZESTRIL) 10 MG tablet 3. hydrochlorothiazide (HYDRODIURIL) 25 MG tablet  4. gabapentin (NEURONTIN) 400 MG capsule 5. estradiol (ESTRACE) 2 MG tablet  90 day supply  LOV: 10/28/18 Please advise. Thanks TNP

## 2019-01-07 DIAGNOSIS — Z96641 Presence of right artificial hip joint: Secondary | ICD-10-CM | POA: Diagnosis not present

## 2019-01-07 MED ORDER — GABAPENTIN 400 MG PO CAPS: 400.0000 mg | ORAL_CAPSULE | Freq: Three times a day (TID) | ORAL | 3 refills | Status: DC

## 2019-01-07 MED ORDER — LISINOPRIL 10 MG PO TABS: 10.0000 mg | ORAL_TABLET | Freq: Every day | ORAL | 3 refills | Status: DC

## 2019-01-07 MED ORDER — ESTRADIOL 2 MG PO TABS: 2.0000 mg | ORAL_TABLET | Freq: Every day | ORAL | 3 refills | Status: DC

## 2019-01-07 MED ORDER — NORTRIPTYLINE HCL 50 MG PO CAPS: 50.0000 mg | ORAL_CAPSULE | Freq: Every day | ORAL | 3 refills | Status: DC

## 2019-01-07 MED ORDER — HYDROCHLOROTHIAZIDE 25 MG PO TABS: 25.0000 mg | ORAL_TABLET | Freq: Every day | ORAL | 3 refills | Status: DC

## 2019-01-09 ENCOUNTER — Telehealth: Payer: Self-pay

## 2019-01-09 NOTE — Telephone Encounter (Signed)
Called pt to schedule AWV and pt was out and not around her calender. PT requested a CB next week. Need to f/u on if she wants her AWV scheduled close to her 04/28/19 f/u apt.  -MM

## 2019-01-13 ENCOUNTER — Ambulatory Visit (INDEPENDENT_AMBULATORY_CARE_PROVIDER_SITE_OTHER): Payer: Medicare Other | Admitting: Gastroenterology

## 2019-01-13 ENCOUNTER — Encounter: Payer: Self-pay | Admitting: Gastroenterology

## 2019-01-13 VITALS — BP 120/77 | HR 83 | Ht 69.0 in | Wt 188.0 lb

## 2019-01-13 DIAGNOSIS — K5903 Drug induced constipation: Secondary | ICD-10-CM | POA: Diagnosis not present

## 2019-01-13 NOTE — Progress Notes (Signed)
Jonathon Bellows MD, MRCP(U.K) [address]  [place]  [place], [place] [address]  Main: [phone]  Fax: [phone]   Gastroenterology Consultation  Referring Provider:     Virginia Crews, MD Primary Care Physician:  Virginia Crews, MD Primary Gastroenterologist:  Dr. Jonathon Bellows  Reason for Consultation:     Abdominal pain         HPI:   Shannon Grimes is a 68 y.o. y/o female referred for consultation & management  by Dr. Brita Romp, [name], MD.     Abdominal pain: Onset: Began all of a sudden last fall, she says she is on a lot of pain medications. Eats well, on a high fiber diet . Says at that time she had a cough . She says many years back had a severe episode of pancreas 4.5 years back secondary to statin - was in the hospital for 7 days. CREON has helped in the past. RUQ pain. Occurs every 3-4 months , lasts several days . Changes to a bland diet and then it passes. Radiates to her back. Squeezing in nature . She has had 3 back surgeries. Never drank a lot of alcohol  Aggravating factors: unknown  Relieving factors :bland diet  Weight loss: little trying to lose  NSAID use: none - takes ibuprofen very occasionally  PPI use :OTC omeprazole  Gall bladder surgery: Says was taken out from a gangrenous gall bladder.  Frequency of bowel movements: 3-4 without stool softners. - unsure if it is around the time sh ehas a flare Change in bowel movements: no  Relief with bowel movements: feels better unsure if helps with the pain never paid attention.  Gas/Bloating/Abdominal distension: feels bloated.   CT abdomen and pelvis 10/2018 : atrophy of pancreas. CBD 9 mm,  Labs 10/2018 : CMP- normal lft's, Hb 12.4  Past Medical History:  Diagnosis Date  . Allergy   . Anemia    pernicious  . Avascular necrosis (HCC)    right hip  . Blood transfusion without reported diagnosis    after hysterectomy  . Carpal tunnel syndrome on right   . Cubital tunnel syndrome  on right   . GERD (gastroesophageal reflux disease)   . Hypertension   . Migraine headache    3-4x/month  . Neuromuscular disorder ([place])    multiple ortho issues with abnormal EMGs  . Osteoarthritis   . PONV (postoperative nausea and vomiting)    also triggers migraines    Past Surgical History:  Procedure Laterality Date  . ANKLE FRACTURE SURGERY  1994   screws removed in 2007  . ANKLE FUSION Right 2006  . APPENDECTOMY  1984  . CATARACT EXTRACTION W/PHACO Right 09/15/2018   Procedure: CATARACT EXTRACTION PHACO AND INTRAOCULAR LENS PLACEMENT ([place]) RIGHT;  Surgeon: Eulogio Bear, MD;  Location: [facility];  Service: Ophthalmology;  Laterality: Right;  . CHOLECYSTECTOMY  1994  . Hip abductor attachment Right 2013  . HIP FRACTURE SURGERY  2001  . HIP SURGERY Right    Revision  . ILIOTIBIAL BAND RELEASE  2016  . LUMBAR FUSION  1999   L5-S1 fusion  . LUMBAR FUSION  2004   L3-L4 fusion  . LUMBAR FUSION  2016   L2-L5, needed revision of previous surgeries  . SACROILIAC JOINT FUSION Right 2012  . STEROID INJECTION TO SCAR    . TOTAL ABDOMINAL HYSTERECTOMY W/ BILATERAL SALPINGOOPHORECTOMY  1984   endometriosis  . TOTAL HIP ARTHROPLASTY Right 2009  Prior to Admission medications   Medication Sig Start Date End Date Taking? Authorizing Provider  cyanocobalamin (,VITAMIN B-12,) 1000 MCG/ML injection INJECT 1 ML AS DIRECTED EVERY 30 (THIRTY) DAYS. 11/20/17   [provider]  diclofenac sodium (VOLTAREN) 1 % GEL Voltaren 1 % topical gel  APPLY 4 GRAM TO THE AFFECTED AREA(S) BY TOPICAL ROUTE AS NEEDED    [provider]  estradiol (ESTRACE) 2 MG tablet Take 1 tablet (2 mg total) by mouth daily. 01/07/19   Bacigalupo, [name], MD  gabapentin (NEURONTIN) 400 MG capsule Take 1 capsule (400 mg total) by mouth 3 (three) times daily. 01/07/19   Virginia Crews, MD  hydrochlorothiazide (HYDRODIURIL) 25 MG tablet Take 1 tablet (25 mg total) by mouth daily.  01/07/19   Virginia Crews, MD  lisinopril (PRINIVIL,ZESTRIL) 10 MG tablet Take 1 tablet (10 mg total) by mouth daily. 01/07/19   Virginia Crews, MD  methylPREDNISolone (MEDROL DOSEPAK) 4 MG TBPK tablet Take 4 mg by mouth daily. dosepack    [provider]  nortriptyline (PAMELOR) 50 MG capsule Take 1 capsule (50 mg total) by mouth at bedtime. 01/07/19   Virginia Crews, MD  oxyCODONE-acetaminophen (PERCOCET) 10-325 MG tablet Take 1 tablet by mouth every 6 (six) hours as needed. 09/24/17   [provider]  OXYCONTIN 20 MG 12 hr tablet Take 20 mg by mouth every 12 (twelve) hours. 09/24/17   [provider]  promethazine (PHENERGAN) 25 MG tablet Take 1 tablet by mouth every 4 (four) hours as needed.    [provider]  SUMAtriptan (IMITREX) 100 MG tablet Take 1 tablet (100 mg total) by mouth daily as needed. 04/24/18   Bacigalupo, [name], MD  tiZANidine (ZANAFLEX) 4 MG tablet Take 1 tablet (4 mg total) by mouth 2 (two) times daily. 10/16/17   Virginia Crews, MD    Family History  Problem Relation Age of Onset  . Stroke Mother 26  . Congestive Heart Failure Mother   . Heart disease Father 71  . Colon cancer Sister 67  . Kidney failure Sister        chronic  . Liver disease Sister        end stage  . Colon cancer Brother 41  . Mitral valve prolapse Sister   . Breast cancer Neg Hx      Social History   Tobacco Use  . Smoking status: Former Smoker    Years: 10.00    Types: Cigarettes    Last attempt to quit: 01/23/1983    Years since quitting: 35.9  . Smokeless tobacco: Never Used  . Tobacco comment: was social smoker, not everyday  Substance Use Topics  . Alcohol use: No  . Drug use: No    Allergies as of 01/13/2019 - Review Complete 11/04/2018  Allergen Reaction Noted  . Pentazocine Anaphylaxis   . Statins Other (See Comments)   . Morphine Nausea And Vomiting     Review of Systems:    All systems reviewed and negative  except where noted in HPI.   Physical Exam:  There were no vitals taken for this visit. No LMP recorded. Patient has had a hysterectomy. Psych:  Alert and cooperative. Normal mood and affect. General:   Alert,  Well-developed, well-nourished, pleasant and cooperative in NAD Head:  Normocephalic and atraumatic. Eyes:  Sclera clear, no icterus.   Conjunctiva pink. Ears:  Normal auditory acuity. Nose:  No deformity, discharge, or lesions. Mouth:  No deformity  or lesions,oropharynx pink & moist. Neck:  Supple; no masses or thyromegaly. Lungs:  Respirations even and unlabored.  Clear throughout to auscultation.   No wheezes, crackles, or rhonchi. No acute distress.Tenderness over rt lower anterior ribs in the anterior axillary line  Heart:  Regular rate and rhythm; no murmurs, clicks, rubs, or gallops. Abdomen:  Normal bowel sounds.  No bruits.  Soft, non-tender and non-distended without masses, hepatosplenomegaly or hernias noted.  No guarding or rebound tenderness.    Neurologic:  Alert and oriented x3;  grossly normal neurologically. Skin:  Intact without significant lesions or rashes. No jaundice. Lymph Nodes:  No significant cervical adenopathy. Psych:  Alert and cooperative. Normal mood and affect.  Imaging Studies: No results found.  Assessment and Plan:   Shannon Grimes is a 68 y.o. y/o female has been referred for rt sided abdominal pain. Based on history an examination likey a combination of drug induced constipation +/- musculoskeletal pain . She has a dilated CBD, will get MRCP. Trial of Motegrity for constipation- samples provided.   Follow up in 4 weeks  Dr Jonathon Bellows MD,MRCP(U.K)

## 2019-01-14 ENCOUNTER — Other Ambulatory Visit: Payer: Self-pay

## 2019-01-14 DIAGNOSIS — K831 Obstruction of bile duct: Secondary | ICD-10-CM

## 2019-01-22 ENCOUNTER — Other Ambulatory Visit: Payer: Self-pay | Admitting: Gastroenterology

## 2019-01-22 ENCOUNTER — Ambulatory Visit
Admission: RE | Admit: 2019-01-22 | Discharge: 2019-01-22 | Disposition: A | Discharge status: Home / Self Care | Payer: Medicare Other | Source: Ambulatory Visit | Attending: Gastroenterology | Admitting: Gastroenterology

## 2019-01-22 DIAGNOSIS — R932 Abnormal findings on diagnostic imaging of liver and biliary tract: Secondary | ICD-10-CM | POA: Diagnosis not present

## 2019-01-22 DIAGNOSIS — K831 Obstruction of bile duct: Secondary | ICD-10-CM | POA: Insufficient documentation

## 2019-01-22 DIAGNOSIS — K760 Fatty (change of) liver, not elsewhere classified: Secondary | ICD-10-CM | POA: Diagnosis not present

## 2019-01-22 LAB — POCT I-STAT CREATININE: Creatinine, Ser: 0.8 mg/dL (ref 0.44–1.00)

## 2019-01-22 MED ORDER — GADOBUTROL 1 MMOL/ML IV SOLN
8.0000 mL | Freq: Once | INTRAVENOUS | Status: AC | PRN
  Administered 2019-01-22: 8 mL via INTRAVENOUS

## 2019-01-23 ENCOUNTER — Encounter: Payer: Self-pay | Admitting: Gastroenterology

## 2019-01-23 ENCOUNTER — Telehealth: Payer: Self-pay

## 2019-01-23 NOTE — Telephone Encounter (Signed)
-----   Message from Jonathon Bellows, MD sent at 01/23/2019  8:08 AM EST ----- Sherald Hess inform MRCP showed no abnormalities- check if constipation is better  Dr Jonathon Bellows MD,MRCP [facility]) Gastroenterology/Hepatology Pager: [phone]

## 2019-01-23 NOTE — Telephone Encounter (Signed)
Spoke with pt and informed her of MRCP results. Pt states the constipation is better. Pt states the Motegrity samples have helped significantly.

## 2019-01-30 ENCOUNTER — Other Ambulatory Visit: Payer: Self-pay | Admitting: Family Medicine

## 2019-02-02 NOTE — Telephone Encounter (Signed)
I would recommend that she gets an AWV.  See if she will go ahead and schedule.  Can have f/u with me afterwards.

## 2019-02-02 NOTE — Telephone Encounter (Signed)
Called pt to try and set up AWV for this year. Pt declined and stated she wanted to speak with Dr B further about this apt. Pt states they had discussed this last year but she cannot remember what she said. Pt did complete an AWV on 01/17/18. FYI! Please advise, thank you. -MM

## 2019-02-02 NOTE — Telephone Encounter (Signed)
Left patient a message advising her as stated below and advised her to call the office back to schedule appointment.

## 2019-02-03 ENCOUNTER — Ambulatory Visit (INDEPENDENT_AMBULATORY_CARE_PROVIDER_SITE_OTHER): Payer: Medicare Other

## 2019-02-03 DIAGNOSIS — E538 Deficiency of other specified B group vitamins: Secondary | ICD-10-CM

## 2019-02-03 MED ORDER — CYANOCOBALAMIN 1000 MCG/ML IJ SOLN
1000.0000 ug | Freq: Once | INTRAMUSCULAR | Status: AC
  Administered 2019-02-03: 1000 ug via INTRAMUSCULAR

## 2019-02-09 DIAGNOSIS — M25559 Pain in unspecified hip: Secondary | ICD-10-CM | POA: Diagnosis not present

## 2019-02-09 DIAGNOSIS — Z79899 Other long term (current) drug therapy: Secondary | ICD-10-CM | POA: Diagnosis not present

## 2019-02-09 DIAGNOSIS — M542 Cervicalgia: Secondary | ICD-10-CM | POA: Diagnosis not present

## 2019-02-13 ENCOUNTER — Other Ambulatory Visit: Payer: Self-pay | Admitting: Physician Assistant

## 2019-02-13 DIAGNOSIS — I10 Essential (primary) hypertension: Secondary | ICD-10-CM

## 2019-02-14 DIAGNOSIS — M542 Cervicalgia: Secondary | ICD-10-CM | POA: Diagnosis not present

## 2019-02-17 ENCOUNTER — Other Ambulatory Visit: Payer: Self-pay

## 2019-02-17 ENCOUNTER — Encounter: Payer: Self-pay | Admitting: Gastroenterology

## 2019-02-17 ENCOUNTER — Ambulatory Visit (INDEPENDENT_AMBULATORY_CARE_PROVIDER_SITE_OTHER): Payer: Medicare Other | Admitting: Gastroenterology

## 2019-02-17 VITALS — BP 115/76 | HR 86 | Ht 69.0 in | Wt 185.2 lb

## 2019-02-17 DIAGNOSIS — K5903 Drug induced constipation: Secondary | ICD-10-CM

## 2019-02-17 MED ORDER — LACTULOSE 20 G PO PACK: 20.0000 g | PACK | Freq: Three times a day (TID) | ORAL | 1 refills | Status: DC

## 2019-02-17 NOTE — Progress Notes (Signed)
Jonathon Bellows MD, MRCP(U.K) [address]  [place]  [place], [place] [address]  Main: [phone]  Fax: [phone]   Primary Care Physician: Virginia Crews, MD  Primary Gastroenterologist:  Dr. Jonathon Bellows   Chief Complaint  Patient presents with  . Follow-up    Constipation    HPI: Shannon Grimes is a 68 y.o. female here to see me for a follow up . Seen initially on 01/13/2019 for abdominal pain began last fall. On a high fiber diet . Says at that time she had a cough . She says many years back had a severe episode of pancreas 4.5 years back secondary to statin - was in the hospital for 7 days. CREON has helped in the past. RUQ pain. Occurs every 3-4 months , lasts several days . Changes to a bland diet and then it passes. Radiates to her back. Squeezing in nature . She has had 3 back surgeries. Never drank a lot of alcohol  Aggravating factors: unknown  Relieving factors :bland diet   No regular NSAID use. S/p cholecystectomy. Constipation +,CT abdomen and pelvis 10/2018 : atrophy of pancreas. CBD 9 mm,  Labs 10/2018 : CMP- normal lft's, Hb 12.4.   Interval history   01/13/2019-  02/17/2019   01/22/2019 : MRCP dilated CBD chronic no filling defects, fatty liver  Motegrity didn't help, tried linzess , was a bit better at 290 mcg but not completely resolved. In addition taken Senna as needed.Feels better after a bowel movement .   Current Outpatient Medications  Medication Sig Dispense Refill  . cyanocobalamin (,VITAMIN B-12,) 1000 MCG/ML injection INJECT 1 ML AS DIRECTED EVERY 30 (THIRTY) DAYS.  11  . diclofenac sodium (VOLTAREN) 1 % GEL Voltaren 1 % topical gel  APPLY 4 GRAM TO THE AFFECTED AREA(S) BY TOPICAL ROUTE AS NEEDED    . estradiol (ESTRACE) 2 MG tablet Take 1 tablet (2 mg total) by mouth daily. 90 tablet 3  . gabapentin (NEURONTIN) 400 MG capsule Take 1 capsule (400 mg total) by mouth 3 (three) times daily. 270 capsule 3  . hydrochlorothiazide  (HYDRODIURIL) 25 MG tablet TAKE 1 TABLET BY MOUTH EVERY DAY 90 tablet 3  . lisinopril (PRINIVIL,ZESTRIL) 10 MG tablet Take 1 tablet (10 mg total) by mouth daily. 90 tablet 3  . methylPREDNISolone (MEDROL DOSEPAK) 4 MG TBPK tablet Take 4 mg by mouth daily. dosepack    . nortriptyline (PAMELOR) 50 MG capsule Take 1 capsule (50 mg total) by mouth at bedtime. 90 capsule 3  . oxyCODONE-acetaminophen (PERCOCET) 10-325 MG tablet Take 1 tablet by mouth every 6 (six) hours as needed.  0  . OXYCONTIN 20 MG 12 hr tablet Take 20 mg by mouth every 12 (twelve) hours.  0  . promethazine (PHENERGAN) 25 MG tablet Take 1 tablet by mouth every 4 (four) hours as needed.    . SUMAtriptan (IMITREX) 100 MG tablet Take 1 tablet (100 mg total) by mouth daily as needed. 10 tablet 5  . tiZANidine (ZANAFLEX) 4 MG tablet Take 1 tablet (4 mg total) by mouth 2 (two) times daily. 180 tablet 2   No current facility-administered medications for this visit.     Allergies as of 02/17/2019 - Review Complete 02/17/2019  Allergen Reaction Noted  . Pentazocine Anaphylaxis   . Statins Other (See Comments)   . Morphine Nausea And Vomiting     ROS:  General: Negative for anorexia, weight loss, fever, chills, fatigue, weakness. ENT: Negative for hoarseness, difficulty swallowing ,  nasal congestion. CV: Negative for chest pain, angina, palpitations, dyspnea on exertion, peripheral edema.  Respiratory: Negative for dyspnea at rest, dyspnea on exertion, cough, sputum, wheezing.  GI: See history of present illness. GU:  Negative for dysuria, hematuria, urinary incontinence, urinary frequency, nocturnal urination.  Endo: Negative for unusual weight change.    Physical Examination:   BP 115/76   Pulse 86   Ht 5\' 9"  (1.753 m)   Wt 185 lb 3.2 oz (84 kg)   BMI 27.35 kg/m   General: Well-nourished, well-developed in no acute distress.  Eyes: No icterus. Conjunctivae pink. Mouth: Oropharyngeal mucosa moist and pink , no lesions  erythema or exudate. Lungs: Clear to auscultation bilaterally. Non-labored. Heart: Regular rate and rhythm, no murmurs rubs or gallops.  Abdomen: Bowel sounds are normal, nontender, nondistended, no hepatosplenomegaly or masses, no abdominal bruits or hernia , no rebound or guarding.   Extremities: No lower extremity edema. No clubbing or deformities. Neuro: Alert and oriented x 3.  Grossly intact. Skin: Warm and dry, no jaundice.   Psych: Alert and cooperative, normal mood and affect.   Imaging Studies: Mr 3d Recon At Scanner  Result Date: 01/22/2019 CLINICAL DATA:  RIGHT-sided abdominal pain. Remote history of pancreatitis. EXAM: MRI ABDOMEN WITHOUT AND WITH CONTRAST (INCLUDING MRCP) TECHNIQUE: Multiplanar multisequence MR imaging of the abdomen was performed both before and after the administration of intravenous contrast. Heavily T2-weighted images of the biliary and pancreatic ducts were obtained, and three-dimensional MRCP images were rendered by post processing. CONTRAST:  8 mL Gadavist COMPARISON:  11/03/2018 FINDINGS: Lower chest:  Lung bases are clear. Hepatobiliary: There is mild intrahepatic and extrahepatic biliary duct dilatation similar to comparison CT and common following cholecystectomy. No filling defect within the common bile duct. No external compression. Normal hepatic parenchyma. No hepatic steatosis. No focal hepatic lesion. Pancreas: Fatty infiltration of the pancreatic head again noted. No pancreatic duct dilatation. No pancreatic inflammation. Spleen: Normal spleen. Adrenals/urinary tract: Adrenal glands and kidneys are normal. Stomach/Bowel: Stomach and limited of the small bowel is unremarkable Vascular/Lymphatic: Abdominal aortic normal caliber. No retroperitoneal periportal lymphadenopathy. Musculoskeletal: No aggressive osseous lesion. Posterior lumbar fusion IMPRESSION: 1. Chronic biliary duct dilatation unchanged from prior and presumably related to prior  cholecystectomy. No filling defect or external compression. 2. Fatty infiltration of the pancreatic head. No inflammation. No duct dilatation. 3. Normal liver parenchyma. Electronically Signed   By: Suzy Bouchard M.D.   On: 01/22/2019 10:46   Mr Abdomen Mrcp Moise Boring Contast  Result Date: 01/22/2019 CLINICAL DATA:  RIGHT-sided abdominal pain. Remote history of pancreatitis. EXAM: MRI ABDOMEN WITHOUT AND WITH CONTRAST (INCLUDING MRCP) TECHNIQUE: Multiplanar multisequence MR imaging of the abdomen was performed both before and after the administration of intravenous contrast. Heavily T2-weighted images of the biliary and pancreatic ducts were obtained, and three-dimensional MRCP images were rendered by post processing. CONTRAST:  8 mL Gadavist COMPARISON:  11/03/2018 FINDINGS: Lower chest:  Lung bases are clear. Hepatobiliary: There is mild intrahepatic and extrahepatic biliary duct dilatation similar to comparison CT and common following cholecystectomy. No filling defect within the common bile duct. No external compression. Normal hepatic parenchyma. No hepatic steatosis. No focal hepatic lesion. Pancreas: Fatty infiltration of the pancreatic head again noted. No pancreatic duct dilatation. No pancreatic inflammation. Spleen: Normal spleen. Adrenals/urinary tract: Adrenal glands and kidneys are normal. Stomach/Bowel: Stomach and limited of the small bowel is unremarkable Vascular/Lymphatic: Abdominal aortic normal caliber. No retroperitoneal periportal lymphadenopathy. Musculoskeletal: No aggressive osseous lesion. Posterior lumbar fusion  IMPRESSION: 1. Chronic biliary duct dilatation unchanged from prior and presumably related to prior cholecystectomy. No filling defect or external compression. 2. Fatty infiltration of the pancreatic head. No inflammation. No duct dilatation. 3. Normal liver parenchyma. Electronically Signed   By: Suzy Bouchard M.D.   On: 01/22/2019 10:46    Assessment and Plan:    Shannon Grimes is a 68 y.o. y/o female here to follow up for RUQ pain, S/p cholecystectomy, no filling defects on MRCP, norma;l LFT's , Did better with Linzess but feels can try somehting different for better relief of constipaiton   Plan  1. Amitiza 24 mcg with lactulose PRN-samples provided    Dr Jonathon Bellows  MD,MRCP [facility]) Follow up in 4 weeks

## 2019-03-03 ENCOUNTER — Ambulatory Visit: Payer: Self-pay

## 2019-03-04 ENCOUNTER — Other Ambulatory Visit: Payer: Self-pay

## 2019-03-04 ENCOUNTER — Encounter: Payer: Self-pay | Admitting: Family Medicine

## 2019-03-04 DIAGNOSIS — I10 Essential (primary) hypertension: Secondary | ICD-10-CM

## 2019-03-04 MED ORDER — LUBIPROSTONE 24 MCG PO CAPS: 24.0000 ug | ORAL_CAPSULE | Freq: Two times a day (BID) | ORAL | 1 refills | Status: DC

## 2019-03-04 MED ORDER — HYDROCHLOROTHIAZIDE 25 MG PO TABS: 25.0000 mg | ORAL_TABLET | Freq: Every day | ORAL | 3 refills | Status: DC

## 2019-03-09 DIAGNOSIS — Z79899 Other long term (current) drug therapy: Secondary | ICD-10-CM | POA: Diagnosis not present

## 2019-03-09 DIAGNOSIS — M25559 Pain in unspecified hip: Secondary | ICD-10-CM | POA: Diagnosis not present

## 2019-03-10 ENCOUNTER — Ambulatory Visit (INDEPENDENT_AMBULATORY_CARE_PROVIDER_SITE_OTHER): Payer: Medicare Other

## 2019-03-10 ENCOUNTER — Other Ambulatory Visit: Payer: Self-pay

## 2019-03-10 DIAGNOSIS — E538 Deficiency of other specified B group vitamins: Secondary | ICD-10-CM | POA: Diagnosis not present

## 2019-03-10 MED ORDER — CYANOCOBALAMIN 1000 MCG/ML IJ SOLN
1000.0000 ug | Freq: Once | INTRAMUSCULAR | Status: AC
  Administered 2019-03-10: 1000 ug via INTRAMUSCULAR

## 2019-03-11 DIAGNOSIS — M5020 Other cervical disc displacement, unspecified cervical region: Secondary | ICD-10-CM | POA: Diagnosis not present

## 2019-03-16 ENCOUNTER — Other Ambulatory Visit: Payer: Self-pay | Admitting: Family Medicine

## 2019-03-16 DIAGNOSIS — M47812 Spondylosis without myelopathy or radiculopathy, cervical region: Secondary | ICD-10-CM | POA: Diagnosis not present

## 2019-03-16 MED ORDER — SUMATRIPTAN SUCCINATE 100 MG PO TABS: 100.0000 mg | ORAL_TABLET | Freq: Every day | ORAL | 5 refills | Status: DC | PRN

## 2019-03-16 NOTE — Telephone Encounter (Signed)
[facility] faxed refill request for the following medications:  SUMAtriptan (IMITREX) 100 MG tablet    Please advise.

## 2019-03-19 DIAGNOSIS — M542 Cervicalgia: Secondary | ICD-10-CM | POA: Diagnosis not present

## 2019-03-19 DIAGNOSIS — M436 Torticollis: Secondary | ICD-10-CM | POA: Diagnosis not present

## 2019-03-24 DIAGNOSIS — M542 Cervicalgia: Secondary | ICD-10-CM | POA: Diagnosis not present

## 2019-03-24 DIAGNOSIS — M436 Torticollis: Secondary | ICD-10-CM | POA: Diagnosis not present

## 2019-03-30 DIAGNOSIS — M436 Torticollis: Secondary | ICD-10-CM | POA: Diagnosis not present

## 2019-03-30 DIAGNOSIS — M542 Cervicalgia: Secondary | ICD-10-CM | POA: Diagnosis not present

## 2019-04-01 ENCOUNTER — Ambulatory Visit: Payer: Medicare Other | Admitting: Gastroenterology

## 2019-04-07 ENCOUNTER — Ambulatory Visit (INDEPENDENT_AMBULATORY_CARE_PROVIDER_SITE_OTHER): Payer: Medicare Other

## 2019-04-07 ENCOUNTER — Other Ambulatory Visit: Payer: Self-pay

## 2019-04-07 DIAGNOSIS — E538 Deficiency of other specified B group vitamins: Secondary | ICD-10-CM | POA: Diagnosis not present

## 2019-04-07 DIAGNOSIS — M542 Cervicalgia: Secondary | ICD-10-CM | POA: Diagnosis not present

## 2019-04-07 DIAGNOSIS — M436 Torticollis: Secondary | ICD-10-CM | POA: Diagnosis not present

## 2019-04-07 MED ORDER — CYANOCOBALAMIN 1000 MCG/ML IJ SOLN
1000.0000 ug | Freq: Once | INTRAMUSCULAR | Status: AC
  Administered 2019-04-07: 1000 ug via INTRAMUSCULAR

## 2019-04-10 DIAGNOSIS — Z87891 Personal history of nicotine dependence: Secondary | ICD-10-CM | POA: Diagnosis not present

## 2019-04-10 DIAGNOSIS — G803 Athetoid cerebral palsy: Secondary | ICD-10-CM | POA: Diagnosis not present

## 2019-04-10 DIAGNOSIS — Z885 Allergy status to narcotic agent status: Secondary | ICD-10-CM | POA: Diagnosis not present

## 2019-04-10 DIAGNOSIS — M797 Fibromyalgia: Secondary | ICD-10-CM | POA: Diagnosis not present

## 2019-04-10 DIAGNOSIS — Z79890 Hormone replacement therapy (postmenopausal): Secondary | ICD-10-CM | POA: Diagnosis not present

## 2019-04-10 DIAGNOSIS — G608 Other hereditary and idiopathic neuropathies: Secondary | ICD-10-CM | POA: Diagnosis not present

## 2019-04-10 DIAGNOSIS — T84020A Dislocation of internal right hip prosthesis, initial encounter: Secondary | ICD-10-CM | POA: Diagnosis not present

## 2019-04-10 DIAGNOSIS — I10 Essential (primary) hypertension: Secondary | ICD-10-CM | POA: Diagnosis not present

## 2019-04-10 DIAGNOSIS — Z888 Allergy status to other drugs, medicaments and biological substances status: Secondary | ICD-10-CM | POA: Diagnosis not present

## 2019-04-10 DIAGNOSIS — D649 Anemia, unspecified: Secondary | ICD-10-CM | POA: Diagnosis not present

## 2019-04-13 DIAGNOSIS — M542 Cervicalgia: Secondary | ICD-10-CM | POA: Diagnosis not present

## 2019-04-13 DIAGNOSIS — M436 Torticollis: Secondary | ICD-10-CM | POA: Diagnosis not present

## 2019-04-23 ENCOUNTER — Telehealth: Payer: Self-pay

## 2019-04-23 NOTE — Telephone Encounter (Signed)
-----   Message from Virginia Crews, MD sent at 04/22/2019  3:45 PM EDT ----- Regarding: RE: Next weeks apt Yes. Let's make her a chronic disease f/u evisit. Thanks ladies! ----- Message ----- From: Fabio Neighbors, LPN Sent: 2/95/1884   3:25 PM EDT To: Jules Schick, CMA, # Subject: Next weeks apt                                 Hey, I called this pt to r/s her AWV to a telephonic visit next Tuesday and she mentioned that she also has an in office 6 month follow up with you. She said she would use [email or web address] if you felt it was necessary for her to have this apt or she could r/s for a later day. Also, I don't know how to schedule those so Im attaching Nikki. The pt will need additional assistance on how to use [email or web address]. Thanks! -[facility]

## 2019-04-23 NOTE — Telephone Encounter (Signed)
Patient scheduled as a e-visit

## 2019-04-28 ENCOUNTER — Other Ambulatory Visit: Payer: Self-pay

## 2019-04-28 ENCOUNTER — Ambulatory Visit: Payer: Self-pay | Admitting: Family Medicine

## 2019-04-28 ENCOUNTER — Ambulatory Visit: Payer: Medicare Other | Admitting: Family Medicine

## 2019-04-28 ENCOUNTER — Ambulatory Visit (INDEPENDENT_AMBULATORY_CARE_PROVIDER_SITE_OTHER): Payer: Medicare Other

## 2019-04-28 VITALS — Wt 176.0 lb

## 2019-04-28 DIAGNOSIS — Z0000 Encounter for general adult medical examination without abnormal findings: Secondary | ICD-10-CM

## 2019-04-28 DIAGNOSIS — Z1231 Encounter for screening mammogram for malignant neoplasm of breast: Secondary | ICD-10-CM | POA: Diagnosis not present

## 2019-04-28 NOTE — Progress Notes (Signed)
Subjective:   Shannon Grimes is a 68 y.o. female who presents for Medicare Annual (Subsequent) preventive examination.    This visit is being conducted through telemedicine due to the COVID-19 pandemic. This patient has given me verbal consent via doximity to conduct this visit, patient states they are participating from their home address. Some vital signs may be absent or patient reported.    Patient identification: identified by name, DOB, and current address  Review of Systems:  N/A  Cardiac Risk Factors include: advanced age (>33men, >42 women);dyslipidemia;hypertension     Objective:     Vitals: Wt 176 lb (79.8 kg)   BMI 25.99 kg/m   Body mass index is 25.99 kg/m. Unable to obtain vitals due to visit being conducted via telephonically.   Advanced Directives 04/28/2019 09/15/2018 01/17/2018 10/16/2017  Does Patient Have a Medical Advance Directive? Yes Yes Yes Yes  Type of Paramedic of [place];Living will [place];Living will Living will;Healthcare Power of [place];Living will  Does patient want to make changes to medical advance directive? - No - Patient declined - -  Copy of [place] in Chart? Yes - validated most recent copy scanned in chart (See row information) No - copy requested No - copy requested -    Tobacco Social History   Tobacco Use  Smoking Status Former Smoker  . Years: 10.00  . Types: Cigarettes  . Last attempt to quit: 01/23/1983  . Years since quitting: 36.2  Smokeless Tobacco Never Used  Tobacco Comment   was social smoker, not everyday     Counseling given: Not Answered Comment: was social smoker, not everyday   Clinical Intake:  Pre-visit preparation completed: Yes  Pain : 0-10 Pain Score: 4  Pain Type: Chronic pain Pain Location: Hip Pain Orientation: Right Pain Descriptors / Indicators: Radiating, Sharp, Throbbing, Aching(to right leg)  Pain Frequency: Constant     Nutritional Status: BMI 25 -29 Overweight Nutritional Risks: None Diabetes: No  How often do you need to have someone help you when you read instructions, pamphlets, or other written materials from your doctor or pharmacy?: 1 - Never  Interpreter Needed?: No  Information entered by :: [facility], LPN  Past Medical History:  Diagnosis Date  . Allergy   . Anemia    pernicious  . Avascular necrosis (HCC)    right hip  . Blood transfusion without reported diagnosis    after hysterectomy  . Carpal tunnel syndrome on right   . Cubital tunnel syndrome on right   . GERD (gastroesophageal reflux disease)   . Hypertension   . Migraine headache    3-4x/month  . Neuromuscular disorder ([place])    multiple ortho issues with abnormal EMGs  . Osteoarthritis   . PONV (postoperative nausea and vomiting)    also triggers migraines   Past Surgical History:  Procedure Laterality Date  . ANKLE FRACTURE SURGERY  1994   screws removed in 2007  . ANKLE FUSION Right 2006  . APPENDECTOMY  1984  . CATARACT EXTRACTION W/PHACO Right 09/15/2018   Procedure: CATARACT EXTRACTION PHACO AND INTRAOCULAR LENS PLACEMENT ([place]) RIGHT;  Surgeon: Eulogio Bear, MD;  Location: [place];  Service: Ophthalmology;  Laterality: Right;  . CHOLECYSTECTOMY  1994  . CLOSED REDUCTION HIP DISLOCATION    . Hip abductor attachment Right 2013  . HIP FRACTURE SURGERY  2001  . HIP SURGERY Right    Revision  . ILIOTIBIAL BAND  RELEASE  2016  . LUMBAR FUSION  1999   L5-S1 fusion  . LUMBAR FUSION  2004   L3-L4 fusion  . LUMBAR FUSION  2016   L2-L5, needed revision of previous surgeries  . SACROILIAC JOINT FUSION Right 2012  . STEROID INJECTION TO SCAR    . TOTAL ABDOMINAL HYSTERECTOMY W/ BILATERAL SALPINGOOPHORECTOMY  1984   endometriosis  . TOTAL HIP ARTHROPLASTY Right 2009   Family History  Problem Relation Age of Onset  . Stroke Mother 53  . Congestive Heart Failure  Mother   . Heart disease Father 85  . Colon cancer Sister 58  . Kidney failure Sister        chronic  . Liver disease Sister        end stage  . Colon cancer Brother 50  . Mitral valve prolapse Sister   . Breast cancer Neg Hx    Social History   Socioeconomic History  . Marital status: Married    Spouse name: Collier Salina  . Number of children: 2  . Years of education: bachelor's  . Highest education level: Bachelor's degree (e.g., BA, AB, BS)  Occupational History  . Occupation: Retired    Comment: Education officer, museum  Social Needs  . Financial resource strain: Not hard at all  . Food insecurity:    Worry: Never true    Inability: Never true  . Transportation needs:    Medical: No    Non-medical: No  Tobacco Use  . Smoking status: Former Smoker    Years: 10.00    Types: Cigarettes    Last attempt to quit: 01/23/1983    Years since quitting: 36.2  . Smokeless tobacco: Never Used  . Tobacco comment: was social smoker, not everyday  Substance and Sexual Activity  . Alcohol use: No  . Drug use: No  . Sexual activity: Yes    Birth control/protection: Surgical  Lifestyle  . Physical activity:    Days per week: 0 days    Minutes per session: 0 min  . Stress: Not at all  Relationships  . Social connections:    Talks on phone: Patient refused    Gets together: Patient refused    Attends religious service: Patient refused    Active member of club or organization: Patient refused    Attends meetings of clubs or organizations: Patient refused    Relationship status: Patient refused  Other Topics Concern  . Not on file  Social History Narrative  . Not on file    Outpatient Encounter Medications as of 04/28/2019  Medication Sig  . cyanocobalamin (,VITAMIN B-12,) 1000 MCG/ML injection INJECT 1 ML AS DIRECTED EVERY 30 (THIRTY) DAYS.  [place] estradiol (ESTRACE) 2 MG tablet Take 1 tablet (2 mg total) by mouth daily.  [place] gabapentin (NEURONTIN) 400 MG capsule Take 1 capsule (400 mg total) by  mouth 3 (three) times daily. (Patient taking differently: Take 400 mg by mouth 3 (three) times daily. Takes 2-3 times daily depending on schedule)  . hydrochlorothiazide (HYDRODIURIL) 25 MG tablet Take 1 tablet (25 mg total) by mouth daily.  [place] Kitchen lisinopril (PRINIVIL,ZESTRIL) 10 MG tablet Take 1 tablet (10 mg total) by mouth daily.  [place] Kitchen lubiprostone (AMITIZA) 24 MCG capsule Take 1 capsule (24 mcg total) by mouth 2 (two) times daily with a meal.  . nortriptyline (PAMELOR) 50 MG capsule Take 1 capsule (50 mg total) by mouth at bedtime.  [place] Kitchen oxyCODONE-acetaminophen (PERCOCET) 10-325 MG tablet Take 1 tablet by mouth every 6 (six)  hours as needed.  . OXYCONTIN 20 MG 12 hr tablet Take 20 mg by mouth every 12 (twelve) hours.  . promethazine (PHENERGAN) 25 MG tablet Take 1 tablet by mouth every 4 (four) hours as needed.  . SUMAtriptan (IMITREX) 100 MG tablet Take 1 tablet (100 mg total) by mouth daily as needed.  [place] tiZANidine (ZANAFLEX) 4 MG tablet Take 1 tablet (4 mg total) by mouth 2 (two) times daily. (Patient taking differently: Take 4 mg by mouth 2 (two) times daily. Taking 1-2 times daily.)  . diclofenac sodium (VOLTAREN) 1 % GEL Voltaren 1 % topical gel  APPLY 4 GRAM TO THE AFFECTED AREA(S) BY TOPICAL ROUTE AS NEEDED  . lactulose (CEPHULAC) 20 g packet Take 1 packet (20 g total) by mouth 3 (three) times daily. (Patient not taking: Reported on 04/28/2019)  . methylPREDNISolone (MEDROL DOSEPAK) 4 MG TBPK tablet Take 4 mg by mouth daily. dosepack   No facility-administered encounter medications on file as of 04/28/2019.     Activities of Daily Living In your present state of health, do you have any difficulty performing the following activities: 04/28/2019 09/15/2018  Hearing? N N  Vision? N N  Difficulty concentrating or making decisions? N N  Walking or climbing stairs? N N  Comment Due to hip pains.  -  Dressing or bathing? N N  Doing errands, shopping? N -  Preparing Food and eating ? N -  Using the  Toilet? N -  In the past six months, have you accidently leaked urine? N -  Do you have problems with loss of bowel control? N -  Managing your Medications? N -  Managing your Finances? N -  Housekeeping or managing your Housekeeping? N -  Some recent data might be hidden    Patient Care Team: Brita Romp [name], MD as PCP - General (Family Medicine) Nadene Rubins, DO as Referring Physician (Physical Medicine and Rehabilitation) Smith Mince, MD as Referring Physician (Orthopedic Surgery) Jonathon Bellows, MD as Consulting Physician (Gastroenterology)    Assessment:   This is a routine wellness examination for [facility].  Exercise Activities and Dietary recommendations Current Exercise Habits: Home exercise routine, Type of exercise: stretching, Time (Minutes): 30, Frequency (Times/Week): 7, Weekly Exercise (Minutes/Week): 210, Intensity: Mild, Exercise limited by: orthopedic condition(s)  Goals    . DIET - REDUCE SUGAR INTAKE     Recommend cutting back on desserts and sugars in daily diet. Pt is cutting back to eating 3 sweets a week.        Fall Risk: Fall Risk  04/28/2019 01/17/2018 10/16/2017  Falls in the past year? 0 No Yes  Number falls in past yr: - - 1  Injury with Fall? - - No  Follow up - - Falls evaluation completed    [place]:  Any stairs in or around the home? No  If so, are there any without handrails? N/A  Home free of loose throw rugs in walkways, pet beds, electrical cords, etc? Yes  Adequate lighting in your home to reduce risk of falls? Yes   ASSISTIVE DEVICES UTILIZED TO PREVENT FALLS:  Life alert? No  Use of a cane, walker or w/c? Yes  Grab bars in the bathroom? Yes  Shower chair or bench in shower? Yes  Elevated toilet seat or a handicapped toilet? Yes    TIMED UP AND GO:  Was the test performed? No .    Depression Screen PHQ 2/9 Scores 04/28/2019 01/17/2018  10/16/2017  PHQ - 2 Score 0 0 0      Cognitive Function: Declined today.         Immunization History  Administered Date(s) Administered  . Influenza, High Dose Seasonal PF 10/24/2017, 10/28/2018  . Pneumococcal Conjugate-13 07/06/2016  . Pneumococcal Polysaccharide-23 11/19/2017  . Zoster 11/21/2011    Qualifies for Shingles Vaccine? Yes  Zostavax completed 11/21/11. Due for Shingrix. Education has been provided regarding the importance of this vaccine. Pt has been advised to call insurance company to determine out of pocket expense. Advised may also receive vaccine at local pharmacy or Health Dept. Verbalized acceptance and understanding.  Tdap: Although this vaccine is not a covered service during a Wellness Exam, does the patient still wish to receive this vaccine today?  No .  Advised may receive this vaccine at local pharmacy or Health Dept. Aware to provide a copy of the vaccination record if obtained from local pharmacy or Health Dept. Verbalized acceptance and understanding.  Flu Vaccine: Up to date  Pneumococcal Vaccine: Up to date  Screening Tests Health Maintenance  Topic Date Due  . TETANUS/TDAP  03/24/1970  . MAMMOGRAM  07/10/2018  . INFLUENZA VACCINE  07/25/2019  . COLONOSCOPY  09/27/2020  . DEXA SCAN  07/10/2021  . Hepatitis C Screening  Completed  . PNA vac Low Risk Adult  Completed    Cancer Screenings:  Colorectal Screening: Completed 09/28/15. Repeat every 5 years.  Mammogram: Completed 07/10/16. Repeat every year. Ordered today. Pt provided with contact info and advised to call to schedule appt.   Bone Density: Completed 07/10/16. Results reflect OSTEOPENIA. Repeat every 5 years.   Lung Cancer Screening: (Low Dose CT Chest recommended if Age 73-80 years, 30 pack-year currently smoking OR have quit w/in 15years.) does not qualify.   Additional Screening:  Hepatitis C Screening: Up to date  Vision Screening: Recommended annual ophthalmology exams for early detection of glaucoma and  other disorders of the eye.  Dental Screening: Recommended annual dental exams for proper oral hygiene  Community Resource Referral:  CRR required this visit?  No       Plan:  I have personally reviewed and addressed the Medicare Annual Wellness questionnaire and have noted the following in the patient's chart:  A. Medical and social history B. Use of alcohol, tobacco or illicit drugs  C. Current medications and supplements D. Functional ability and status E.  Nutritional status F.  Physical activity G. Advance directives H. List of other physicians I.  Hospitalizations, surgeries, and ER visits in previous 12 months J.  [place] such as hearing and vision if needed, cognitive and depression L. Referrals and appointments   In addition, I have reviewed and discussed with patient certain preventive protocols, quality metrics, and best practice recommendations. A written personalized care plan for preventive services as well as general preventive health recommendations were provided to patient. Nurse Health Advisor  Signed,    Kimika Streater [place], [place]  05/25/8365 Nurse Health Advisor   Nurse Notes: Pt declined receiving the tetanus vaccine at next in office visit.

## 2019-04-28 NOTE — Patient Instructions (Signed)
Shannon Grimes , Thank you for taking time to come for your Medicare Wellness Visit. I appreciate your ongoing commitment to your health goals. Please review the following plan we discussed and let me know if I can assist you in the future.   Screening recommendations/referrals: Colonoscopy: Up to date, due 09/2025 Mammogram: Ordered today. Pt advised to contact imaging center to set up apt. Bone Density: Up to date, due 06/2021 Recommended yearly ophthalmology/optometry visit for glaucoma screening and checkup Recommended yearly dental visit for hygiene and checkup  Vaccinations: Influenza vaccine: Up to date Pneumococcal vaccine: Completed series Tdap vaccine: Pt declines today.  Shingles vaccine: Pt declines today.     Advanced directives: Currently on file.   Conditions/risks identified: Continue to cut back on sweets and carbohydrates to help aid in weight loss.   Next appointment: 05/68/20 with Dr Brita Romp. Declined scheduling the AWV for 2021 at this time.    Preventive Care 68 Years and Older, Female Preventive care refers to lifestyle choices and visits with your health care provider that can promote health and wellness. What does preventive care include?  A yearly physical exam. This is also called an annual well check.  Dental exams once or twice a year.  Routine eye exams. Ask your health care provider how often you should have your eyes checked.  Personal lifestyle choices, including:  Daily care of your teeth and gums.  Regular physical activity.  Eating a healthy diet.  Avoiding tobacco and drug use.  Limiting alcohol use.  Practicing safe sex.  Taking low-dose aspirin every day.  Taking vitamin and mineral supplements as recommended by your health care provider. What happens during an annual well check? The services and screenings done by your health care provider during your annual well check will depend on your age, overall health, lifestyle risk  factors, and family history of disease. Counseling  Your health care provider may ask you questions about your:  Alcohol use.  Tobacco use.  Drug use.  Emotional well-being.  Home and relationship well-being.  Sexual activity.  Eating habits.  History of falls.  Memory and ability to understand (cognition).  Work and work Statistician.  Reproductive health. Screening  You may have the following tests or measurements:  Height, weight, and BMI.  Blood pressure.  Lipid and cholesterol levels. These may be checked every 5 years, or more frequently if you are over 22 years old.  Skin check.  Lung cancer screening. You may have this screening every year starting at age 63 if you have a 30-pack-year history of smoking and currently smoke or have quit within the past 15 years.  Fecal occult blood test (FOBT) of the stool. You may have this test every year starting at age 68.  Flexible sigmoidoscopy or colonoscopy. You may have a sigmoidoscopy every 5 years or a colonoscopy every 10 years starting at age 68.  Hepatitis C blood test.  Hepatitis B blood test.  Sexually transmitted disease (STD) testing.  Diabetes screening. This is done by checking your blood sugar (glucose) after you have not eaten for a while (fasting). You may have this done every 1-3 years.  Bone density scan. This is done to screen for osteoporosis. You may have this done starting at age 18.  Mammogram. This may be done every 1-2 years. Talk to your health care provider about how often you should have regular mammograms. Talk with your health care provider about your test results, treatment options, and if necessary, the need  for more tests. Vaccines  Your health care provider may recommend certain vaccines, such as:  Influenza vaccine. This is recommended every year.  Tetanus, diphtheria, and acellular pertussis (Tdap, Td) vaccine. You may need a Td booster every 10 years.  Zoster vaccine. You  may need this after age 68.  Pneumococcal 13-valent conjugate (PCV13) vaccine. One dose is recommended after age 68.  Pneumococcal polysaccharide (PPSV23) vaccine. One dose is recommended after age 68. Talk to your health care provider about which screenings and vaccines you need and how often you need them. This information is not intended to replace advice given to you by your health care provider. Make sure you discuss any questions you have with your health care provider. Document Released: 01/06/2016 Document Revised: 08/29/2016 Document Reviewed: 10/11/2015 Elsevier Interactive Patient Education  2017 [place] Prevention in the Home Falls can cause injuries. They can happen to people of all ages. There are many things you can do to make your home safe and to help prevent falls. What can I do on the outside of my home?  Regularly fix the edges of walkways and driveways and fix any cracks.  Remove anything that might make you trip as you walk through a door, such as a raised step or threshold.  Trim any bushes or trees on the path to your home.  Use bright outdoor lighting.  Clear any walking paths of anything that might make someone trip, such as rocks or tools.  Regularly check to see if handrails are loose or broken. Make sure that both sides of any steps have handrails.  Any raised decks and porches should have guardrails on the edges.  Have any leaves, snow, or ice cleared regularly.  Use sand or salt on walking paths during winter.  Clean up any spills in your garage right away. This includes oil or grease spills. What can I do in the bathroom?  Use night lights.  Install grab bars by the toilet and in the tub and shower. Do not use towel bars as grab bars.  Use non-skid mats or decals in the tub or shower.  If you need to sit down in the shower, use a plastic, non-slip stool.  Keep the floor dry. Clean up any water that spills on the floor as soon as  it happens.  Remove soap buildup in the tub or shower regularly.  Attach bath mats securely with double-sided non-slip rug tape.  Do not have throw rugs and other things on the floor that can make you trip. What can I do in the bedroom?  Use night lights.  Make sure that you have a light by your bed that is easy to reach.  Do not use any sheets or blankets that are too big for your bed. They should not hang down onto the floor.  Have a firm chair that has side arms. You can use this for support while you get dressed.  Do not have throw rugs and other things on the floor that can make you trip. What can I do in the kitchen?  Clean up any spills right away.  Avoid walking on wet floors.  Keep items that you use a lot in easy-to-reach places.  If you need to reach something above you, use a strong step stool that has a grab bar.  Keep electrical cords out of the way.  Do not use floor polish or wax that makes floors slippery. If you must use wax, use  non-skid floor wax.  Do not have throw rugs and other things on the floor that can make you trip. What can I do with my stairs?  Do not leave any items on the stairs.  Make sure that there are handrails on both sides of the stairs and use them. Fix handrails that are broken or loose. Make sure that handrails are as long as the stairways.  Check any carpeting to make sure that it is firmly attached to the stairs. Fix any carpet that is loose or worn.  Avoid having throw rugs at the top or bottom of the stairs. If you do have throw rugs, attach them to the floor with carpet tape.  Make sure that you have a light switch at the top of the stairs and the bottom of the stairs. If you do not have them, ask someone to add them for you. What else can I do to help prevent falls?  Wear shoes that:  Do not have high heels.  Have rubber bottoms.  Are comfortable and fit you well.  Are closed at the toe. Do not wear sandals.  If  you use a stepladder:  Make sure that it is fully opened. Do not climb a closed stepladder.  Make sure that both sides of the stepladder are locked into place.  Ask someone to hold it for you, if possible.  Clearly mark and make sure that you can see:  Any grab bars or handrails.  First and last steps.  Where the edge of each step is.  Use tools that help you move around (mobility aids) if they are needed. These include:  Canes.  Walkers.  Scooters.  Crutches.  Turn on the lights when you go into a dark area. Replace any light bulbs as soon as they burn out.  Set up your furniture so you have a clear path. Avoid moving your furniture around.  If any of your floors are uneven, fix them.  If there are any pets around you, be aware of where they are.  Review your medicines with your doctor. Some medicines can make you feel dizzy. This can increase your chance of falling. Ask your doctor what other things that you can do to help prevent falls. This information is not intended to replace advice given to you by your health care provider. Make sure you discuss any questions you have with your health care provider. Document Released: 10/06/2009 Document Revised: 05/17/2016 Document Reviewed: 01/14/2015 Elsevier Interactive Patient Education  2017 [employer].

## 2019-04-29 DIAGNOSIS — M25559 Pain in unspecified hip: Secondary | ICD-10-CM | POA: Diagnosis not present

## 2019-04-29 DIAGNOSIS — Z79899 Other long term (current) drug therapy: Secondary | ICD-10-CM | POA: Diagnosis not present

## 2019-04-30 DIAGNOSIS — Z1159 Encounter for screening for other viral diseases: Secondary | ICD-10-CM | POA: Diagnosis not present

## 2019-04-30 DIAGNOSIS — Z01818 Encounter for other preprocedural examination: Secondary | ICD-10-CM | POA: Diagnosis not present

## 2019-04-30 DIAGNOSIS — Z01810 Encounter for preprocedural cardiovascular examination: Secondary | ICD-10-CM | POA: Diagnosis not present

## 2019-04-30 DIAGNOSIS — S73004A Unspecified dislocation of right hip, initial encounter: Secondary | ICD-10-CM | POA: Diagnosis not present

## 2019-04-30 DIAGNOSIS — Z01812 Encounter for preprocedural laboratory examination: Secondary | ICD-10-CM | POA: Diagnosis not present

## 2019-05-05 ENCOUNTER — Ambulatory Visit: Payer: Medicare Other | Admitting: Gastroenterology

## 2019-05-05 ENCOUNTER — Ambulatory Visit: Payer: Self-pay

## 2019-05-05 DIAGNOSIS — K219 Gastro-esophageal reflux disease without esophagitis: Secondary | ICD-10-CM | POA: Diagnosis present

## 2019-05-05 DIAGNOSIS — M797 Fibromyalgia: Secondary | ICD-10-CM | POA: Diagnosis present

## 2019-05-05 DIAGNOSIS — M67853 Other specified disorders of tendon, right hip: Secondary | ICD-10-CM | POA: Diagnosis present

## 2019-05-05 DIAGNOSIS — I10 Essential (primary) hypertension: Secondary | ICD-10-CM | POA: Diagnosis present

## 2019-05-05 DIAGNOSIS — Z79891 Long term (current) use of opiate analgesic: Secondary | ICD-10-CM | POA: Diagnosis not present

## 2019-05-05 DIAGNOSIS — M25551 Pain in right hip: Secondary | ICD-10-CM | POA: Diagnosis not present

## 2019-05-05 DIAGNOSIS — Z96641 Presence of right artificial hip joint: Secondary | ICD-10-CM | POA: Diagnosis not present

## 2019-05-05 DIAGNOSIS — G8929 Other chronic pain: Secondary | ICD-10-CM | POA: Diagnosis present

## 2019-05-05 DIAGNOSIS — Z87891 Personal history of nicotine dependence: Secondary | ICD-10-CM | POA: Diagnosis not present

## 2019-05-05 DIAGNOSIS — G43909 Migraine, unspecified, not intractable, without status migrainosus: Secondary | ICD-10-CM | POA: Diagnosis present

## 2019-05-05 DIAGNOSIS — T84090A Other mechanical complication of internal right hip prosthesis, initial encounter: Secondary | ICD-10-CM | POA: Diagnosis not present

## 2019-05-05 DIAGNOSIS — T84020A Dislocation of internal right hip prosthesis, initial encounter: Secondary | ICD-10-CM | POA: Diagnosis present

## 2019-05-05 DIAGNOSIS — D649 Anemia, unspecified: Secondary | ICD-10-CM | POA: Diagnosis present

## 2019-05-08 DIAGNOSIS — R2689 Other abnormalities of gait and mobility: Secondary | ICD-10-CM | POA: Diagnosis not present

## 2019-05-08 DIAGNOSIS — M25551 Pain in right hip: Secondary | ICD-10-CM | POA: Diagnosis not present

## 2019-05-08 DIAGNOSIS — M6281 Muscle weakness (generalized): Secondary | ICD-10-CM | POA: Diagnosis not present

## 2019-05-11 ENCOUNTER — Encounter: Payer: Self-pay | Admitting: Family Medicine

## 2019-05-11 ENCOUNTER — Ambulatory Visit (INDEPENDENT_AMBULATORY_CARE_PROVIDER_SITE_OTHER): Payer: Medicare Other | Admitting: Family Medicine

## 2019-05-11 VITALS — BP 121/78

## 2019-05-11 DIAGNOSIS — K5903 Drug induced constipation: Secondary | ICD-10-CM | POA: Diagnosis not present

## 2019-05-11 DIAGNOSIS — E663 Overweight: Secondary | ICD-10-CM | POA: Diagnosis not present

## 2019-05-11 DIAGNOSIS — T402X5A Adverse effect of other opioids, initial encounter: Secondary | ICD-10-CM

## 2019-05-11 DIAGNOSIS — I700 Atherosclerosis of aorta: Secondary | ICD-10-CM

## 2019-05-11 DIAGNOSIS — G43109 Migraine with aura, not intractable, without status migrainosus: Secondary | ICD-10-CM | POA: Diagnosis not present

## 2019-05-11 DIAGNOSIS — I10 Essential (primary) hypertension: Secondary | ICD-10-CM

## 2019-05-11 DIAGNOSIS — D510 Vitamin B12 deficiency anemia due to intrinsic factor deficiency: Secondary | ICD-10-CM

## 2019-05-11 DIAGNOSIS — E785 Hyperlipidemia, unspecified: Secondary | ICD-10-CM

## 2019-05-11 MED ORDER — NORTRIPTYLINE HCL 50 MG PO CAPS: 100.0000 mg | ORAL_CAPSULE | Freq: Every day | ORAL | 3 refills | Status: DC

## 2019-05-11 NOTE — Assessment & Plan Note (Signed)
Not currently on treatment She is in a clinical study for lipids Intolerant to statins due to pancreatitis Recheck FLP and CMP at next in person visit Discussed diet and exercise

## 2019-05-11 NOTE — Assessment & Plan Note (Signed)
Discussed diet and exercise Congratulated on weight loss

## 2019-05-11 NOTE — Assessment & Plan Note (Signed)
Continue monthly B12 injections Recheck B12 level at next visit

## 2019-05-11 NOTE — Assessment & Plan Note (Signed)
No CP or other symptoms Discussed importance of BP and cholesterol control

## 2019-05-11 NOTE — Assessment & Plan Note (Signed)
Followed by GI Continue Amitiza Continue miralax and laxatives prn

## 2019-05-11 NOTE — Assessment & Plan Note (Addendum)
Well controlled Continue nortriptyline - as she is having trouble sleeping, will increase to 100mg  daily Continue imitrex prn

## 2019-05-11 NOTE — Assessment & Plan Note (Signed)
Well controlled Continue current meds Plan to recheck metabolic panel at next in person visit Will send ROI to [facility] to get labs from recent hospitalization

## 2019-05-11 NOTE — Progress Notes (Signed)
Patient: Shannon Grimes Female    DOB: 1951-05-25   68 y.o.   MRN: [id number] Visit Date: 05/11/2019  Today's Provider: [name], MD   Chief Complaint  Patient presents with   Hypertension   Hyperlipidemia   Subjective:    Virtual Visit via Video Note  I connected with Shannon Grimes on 05/11/19 at 10:40 AM EDT by a video enabled telemedicine application and verified that I am speaking with the correct person using two identifiers.   Patient location: home Provider location: home office  Persons involved in the visit: patient, provider    I discussed the limitations of evaluation and management by telemedicine and the availability of in person appointments. The patient expressed understanding and agreed to proceed.  HPI   She is 6 days post-op from revision of hip replacement due to dislocation and muscle tears.  She is seeing Dr Wilhemena Durie for chronic opioid-induced constipation.  Doing well on Amitiza.  Prior to pandemic, she was working hard on exercise and got weight down to 176lbs.  She has been stuck at home and not exercising recently.  She knows weight is creeping up.   Hypertension, follow-up:  BP Readings from Last 3 Encounters:  05/11/19 121/78  02/17/19 115/76  01/13/19 120/77    She was last seen for hypertension 6 months ago.  BP at that visit was 125/78. Management since that visit includes no change.She reports good compliance with treatment. She is not having side effects.  She is exercising lightly. She had right hip surgery 1 week ago. She is adherent to low salt diet.   Outside blood pressures are being checked at home. She is experiencing none.  Patient denies chest pain, chest pressure/discomfort, claudication, dyspnea, exertional chest pressure/discomfort, fatigue, irregular heart beat, lower extremity edema, near-syncope, orthopnea, palpitations, paroxysmal nocturnal dyspnea, syncope and tachypnea.   Cardiovascular risk factors  include advanced age (older than 20 for men, 30 for women), dyslipidemia and hypertension.  Use of agents associated with hypertension: estrogens.   ------------------------------------------------------------------------    Lipid/Cholesterol, Follow-up:   Last seen for this 6 months ago.  Management since that visit includes no change.  Last Lipid Panel:    Component Value Date/Time   CHOL 159 10/28/2018 1019   TRIG 308 (H) 10/28/2018 1019   HDL 26 (L) 10/28/2018 1019   CHOLHDL 6.1 (H) 10/28/2018 1019   CHOLHDL 5.1 (H) 10/16/2017 1058   LDLCALC 71 10/28/2018 1019   LDLCALC 132 (H) 10/16/2017 1058    She reports good compliance with treatment. She is not having side effects.   Wt Readings from Last 3 Encounters:  04/28/19 176 lb (79.8 kg)  02/17/19 185 lb 3.2 oz (84 kg)  01/13/19 188 lb (85.3 kg)    ------------------------------------------------------------------------  Allergies  Allergen Reactions   Pentazocine Anaphylaxis   Statins Other (See Comments)    pancreatitis   Morphine Nausea And Vomiting    And migraine     Current Outpatient Medications:    cyanocobalamin (,VITAMIN B-12,) 1000 MCG/ML injection, INJECT 1 ML AS DIRECTED EVERY 30 (THIRTY) DAYS., Disp: , Rfl: 11   diclofenac sodium (VOLTAREN) 1 % GEL, Voltaren 1 % topical gel  APPLY 4 GRAM TO THE AFFECTED AREA(S) BY TOPICAL ROUTE AS NEEDED, Disp: , Rfl:    estradiol (ESTRACE) 2 MG tablet, Take 1 tablet (2 mg total) by mouth daily., Disp: 90 tablet, Rfl: 3   gabapentin (NEURONTIN) 400 MG capsule, Take 1 capsule (400 mg total)  by mouth 3 (three) times daily. (Patient taking differently: Take 400 mg by mouth 3 (three) times daily. Takes 2-3 times daily depending on schedule), Disp: 270 capsule, Rfl: 3   hydrochlorothiazide (HYDRODIURIL) 25 MG tablet, Take 1 tablet (25 mg total) by mouth daily., Disp: 90 tablet, Rfl: 3   lisinopril (PRINIVIL,ZESTRIL) 10 MG tablet, Take 1 tablet (10 mg total) by  mouth daily., Disp: 90 tablet, Rfl: 3   lubiprostone (AMITIZA) 24 MCG capsule, Take 1 capsule (24 mcg total) by mouth 2 (two) times daily with a meal., Disp: 180 capsule, Rfl: 1   nortriptyline (PAMELOR) 50 MG capsule, Take 2 capsules (100 mg total) by mouth at bedtime., Disp: 180 capsule, Rfl: 3   oxyCODONE-acetaminophen (PERCOCET) 10-325 MG tablet, Take 1 tablet by mouth every 6 (six) hours as needed., Disp: , Rfl: 0   OXYCONTIN 20 MG 12 hr tablet, Take 20 mg by mouth every 12 (twelve) hours., Disp: , Rfl: 0   promethazine (PHENERGAN) 25 MG tablet, Take 1 tablet by mouth every 4 (four) hours as needed., Disp: , Rfl:    SUMAtriptan (IMITREX) 100 MG tablet, Take 1 tablet (100 mg total) by mouth daily as needed., Disp: 10 tablet, Rfl: 5   tiZANidine (ZANAFLEX) 4 MG tablet, Take 1 tablet (4 mg total) by mouth 2 (two) times daily. (Patient taking differently: Take 4 mg by mouth 2 (two) times daily. Taking 1-2 times daily.), Disp: 180 tablet, Rfl: 2   Review of Systems  Constitutional: Negative.   Respiratory: Negative.   Cardiovascular: Negative.   Musculoskeletal: Negative.     Social History   Tobacco Use   Smoking status: Former Smoker    Years: 10.00    Types: Cigarettes    Last attempt to quit: 01/23/1983    Years since quitting: 36.3   Smokeless tobacco: Never Used   Tobacco comment: was social smoker, not everyday  Substance Use Topics   Alcohol use: No      Objective:   BP 121/78  Vitals:   05/11/19 1048  BP: 121/78     Physical Exam Constitutional:      Appearance: Normal appearance.  Pulmonary:     Effort: Pulmonary effort is normal. No respiratory distress.  Neurological:     Mental Status: She is alert and oriented to person, place, and time.  Psychiatric:        Mood and Affect: Mood normal.        Behavior: Behavior normal.        Assessment & Plan    I discussed the assessment and treatment plan with the patient. The patient was provided an  opportunity to ask questions and all were answered. The patient agreed with the plan and demonstrated an understanding of the instructions.   The patient was advised to call back or seek an in-person evaluation if the symptoms worsen or if the condition fails to improve as anticipated.  Problem List Items Addressed This Visit      Cardiovascular and Mediastinum   Migraines    Well controlled Continue nortriptyline - as she is having trouble sleeping, will increase to 100mg  daily Continue imitrex prn      Relevant Medications   nortriptyline (PAMELOR) 50 MG capsule   Hypertension - Primary    Well controlled Continue current meds Plan to recheck metabolic panel at next in person visit Will send ROI to [facility] to get labs from recent hospitalization      Aortic atherosclerosis ([place])  No CP or other symptoms Discussed importance of BP and cholesterol control        Digestive   Constipation due to opioid therapy    Followed by GI Continue Amitiza Continue miralax and laxatives prn        Other   Hyperlipidemia    Not currently on treatment She is in a clinical study for lipids Intolerant to statins due to pancreatitis Recheck FLP and CMP at next in person visit Discussed diet and exercise      Overweight    Discussed diet and exercise Congratulated on weight loss      Pernicious anemia    Continue monthly B12 injections Recheck B12 level at next visit          Return in about 6 months (around 11/11/2019) for chronic disease f/u.   The entirety of the information documented in the History of Present Illness, Review of Systems and Physical Exam were personally obtained by me. Portions of this information were initially documented by Tiburcio Pea, CMA and reviewed by me for thoroughness and accuracy.    Nehemiah Montee, [name], MD MPH [facility]

## 2019-05-12 ENCOUNTER — Ambulatory Visit: Payer: Medicare Other | Admitting: Family Medicine

## 2019-05-12 DIAGNOSIS — Z79899 Other long term (current) drug therapy: Secondary | ICD-10-CM | POA: Diagnosis not present

## 2019-05-12 DIAGNOSIS — M25559 Pain in unspecified hip: Secondary | ICD-10-CM | POA: Diagnosis not present

## 2019-05-13 ENCOUNTER — Ambulatory Visit (INDEPENDENT_AMBULATORY_CARE_PROVIDER_SITE_OTHER): Payer: Medicare Other

## 2019-05-13 ENCOUNTER — Other Ambulatory Visit: Payer: Self-pay

## 2019-05-13 DIAGNOSIS — E538 Deficiency of other specified B group vitamins: Secondary | ICD-10-CM | POA: Diagnosis not present

## 2019-05-13 MED ORDER — CYANOCOBALAMIN 1000 MCG/ML IJ SOLN
1000.0000 ug | Freq: Once | INTRAMUSCULAR | Status: AC
  Administered 2019-05-13: 1000 ug via INTRAMUSCULAR

## 2019-05-25 LAB — RBC: RBC: 3.27 — AB (ref 3.87–5.11)

## 2019-05-25 LAB — HEMATOCRIT: HCT: 29 (ref 29–41)

## 2019-05-25 LAB — POTASSIUM: Potassium: 4.6

## 2019-05-25 LAB — PLATELET COUNT: Platelets: 214

## 2019-05-25 LAB — GLUCOSE 16585: Glucose: 149

## 2019-05-25 LAB — CALCIUM: Calcium: 7.8

## 2019-05-25 LAB — HEMOGLOBIN: Hemoglobin: 9.7

## 2019-05-25 LAB — WBC: WBC: 9.51

## 2019-05-25 LAB — SODIUM: Sodium: 136

## 2019-05-25 LAB — CREATININE: Creat: 0.9

## 2019-05-25 LAB — RH FACTOR: Rh Factor: POSITIVE

## 2019-05-25 LAB — ANTIBODY SCREEN: Antibody Screen: NEGATIVE

## 2019-05-25 LAB — CO2, TOTAL: Carbon Dioxide, Total: 26

## 2019-05-25 LAB — CHLORIDE: Chloride: 101

## 2019-05-27 DIAGNOSIS — R2689 Other abnormalities of gait and mobility: Secondary | ICD-10-CM | POA: Diagnosis not present

## 2019-05-27 DIAGNOSIS — M6281 Muscle weakness (generalized): Secondary | ICD-10-CM | POA: Diagnosis not present

## 2019-05-27 DIAGNOSIS — M25551 Pain in right hip: Secondary | ICD-10-CM | POA: Diagnosis not present

## 2019-06-01 DIAGNOSIS — M6281 Muscle weakness (generalized): Secondary | ICD-10-CM | POA: Diagnosis not present

## 2019-06-01 DIAGNOSIS — R2689 Other abnormalities of gait and mobility: Secondary | ICD-10-CM | POA: Diagnosis not present

## 2019-06-01 DIAGNOSIS — M25551 Pain in right hip: Secondary | ICD-10-CM | POA: Diagnosis not present

## 2019-06-03 DIAGNOSIS — M25551 Pain in right hip: Secondary | ICD-10-CM | POA: Diagnosis not present

## 2019-06-03 DIAGNOSIS — R2689 Other abnormalities of gait and mobility: Secondary | ICD-10-CM | POA: Diagnosis not present

## 2019-06-03 DIAGNOSIS — M6281 Muscle weakness (generalized): Secondary | ICD-10-CM | POA: Diagnosis not present

## 2019-06-08 DIAGNOSIS — R2689 Other abnormalities of gait and mobility: Secondary | ICD-10-CM | POA: Diagnosis not present

## 2019-06-08 DIAGNOSIS — M25551 Pain in right hip: Secondary | ICD-10-CM | POA: Diagnosis not present

## 2019-06-08 DIAGNOSIS — M6281 Muscle weakness (generalized): Secondary | ICD-10-CM | POA: Diagnosis not present

## 2019-06-11 ENCOUNTER — Ambulatory Visit: Payer: Medicare Other | Admitting: Gastroenterology

## 2019-06-12 DIAGNOSIS — R2689 Other abnormalities of gait and mobility: Secondary | ICD-10-CM | POA: Diagnosis not present

## 2019-06-12 DIAGNOSIS — M6281 Muscle weakness (generalized): Secondary | ICD-10-CM | POA: Diagnosis not present

## 2019-06-12 DIAGNOSIS — M25551 Pain in right hip: Secondary | ICD-10-CM | POA: Diagnosis not present

## 2019-06-15 DIAGNOSIS — M25571 Pain in right ankle and joints of right foot: Secondary | ICD-10-CM | POA: Diagnosis not present

## 2019-06-15 DIAGNOSIS — T84020A Dislocation of internal right hip prosthesis, initial encounter: Secondary | ICD-10-CM | POA: Diagnosis not present

## 2019-06-15 DIAGNOSIS — Z96641 Presence of right artificial hip joint: Secondary | ICD-10-CM | POA: Diagnosis not present

## 2019-06-17 ENCOUNTER — Ambulatory Visit (INDEPENDENT_AMBULATORY_CARE_PROVIDER_SITE_OTHER): Payer: Medicare Other

## 2019-06-17 ENCOUNTER — Other Ambulatory Visit: Payer: Self-pay

## 2019-06-17 DIAGNOSIS — E538 Deficiency of other specified B group vitamins: Secondary | ICD-10-CM | POA: Diagnosis not present

## 2019-06-17 MED ORDER — CYANOCOBALAMIN 1000 MCG/ML IJ SOLN
1000.0000 ug | Freq: Once | INTRAMUSCULAR | Status: AC
  Administered 2019-06-17: 1000 ug via INTRAMUSCULAR

## 2019-06-18 DIAGNOSIS — H2513 Age-related nuclear cataract, bilateral: Secondary | ICD-10-CM | POA: Diagnosis not present

## 2019-06-18 DIAGNOSIS — H40003 Preglaucoma, unspecified, bilateral: Secondary | ICD-10-CM | POA: Diagnosis not present

## 2019-07-06 ENCOUNTER — Ambulatory Visit (INDEPENDENT_AMBULATORY_CARE_PROVIDER_SITE_OTHER): Payer: Medicare Other | Admitting: Family Medicine

## 2019-07-06 ENCOUNTER — Other Ambulatory Visit: Payer: Self-pay

## 2019-07-06 ENCOUNTER — Encounter: Payer: Self-pay | Admitting: Family Medicine

## 2019-07-06 VITALS — BP 99/65 | HR 88 | Temp 97.5°F | Resp 16 | Ht 69.0 in | Wt 176.0 lb

## 2019-07-06 DIAGNOSIS — B370 Candidal stomatitis: Secondary | ICD-10-CM

## 2019-07-06 DIAGNOSIS — I952 Hypotension due to drugs: Secondary | ICD-10-CM | POA: Diagnosis not present

## 2019-07-06 DIAGNOSIS — I10 Essential (primary) hypertension: Secondary | ICD-10-CM

## 2019-07-06 DIAGNOSIS — R0609 Other forms of dyspnea: Secondary | ICD-10-CM | POA: Diagnosis not present

## 2019-07-06 MED ORDER — NYSTATIN 100000 UNIT/ML MT SUSP: 5.0000 mL | Freq: Four times a day (QID) | OROMUCOSAL | 0 refills | Status: DC

## 2019-07-06 NOTE — Patient Instructions (Signed)
Oral Thrush, Adult  Oral thrush, also called oral candidiasis, is a fungal infection that develops in the mouth and throat and on the tongue. It causes white patches to form on the mouth and tongue. Thrush is most common in older adults, but it can occur at any age. Many cases of thrush are mild, but this infection can also be serious. Thrush can be a repeated (recurrent) problem for certain people who have a weak body defense system (immune system). The weakness can be caused by chronic illnesses, or by taking medicines that limit the body's ability to fight infection. If a person has difficulty fighting infection, the fungus that causes thrush can spread through the body. This can cause life-threatening blood or organ infections. What are the causes? This condition is caused by a fungus (yeast) called Candida albicans.  This fungus is normally present in small amounts in the mouth and on other mucous membranes. It usually causes no harm.  If conditions are present that allow the fungus to grow without control, it invades surrounding tissues and becomes an infection.  Other Candida species can also lead to thrush (rare). What increases the risk? This condition is more likely to develop in:  People with a weakened immune system.  Older adults.  People with HIV (human immunodeficiency virus).  People with diabetes.  People with dry mouth (xerostomia).  Pregnant women.  People with poor dental care, especially people who have false teeth.  People who use antibiotic medicines. What are the signs or symptoms? Symptoms of this condition can vary from mild and moderate to severe and persistent. Symptoms may include:  A burning feeling in the mouth and throat. This can occur at the start of a thrush infection.  White patches that stick to the mouth and tongue. The tissue around the patches may be red, raw, and painful. If rubbed (during tooth brushing, for example), the patches and the  tissue of the mouth may bleed easily.  A bad taste in the mouth or difficulty tasting foods.  A cottony feeling in the mouth.  Pain during eating and swallowing.  Poor appetite.  Cracking at the corners of the mouth. How is this diagnosed? This condition is diagnosed based on:  Physical exam. Your health care provider will look in your mouth.  Health history. Your health care provider will ask you questions about your health. How is this treated? This condition is treated with medicines called antifungals, which prevent the growth of fungi. These medicines are either applied directly to the affected area (topical) or swallowed (oral). The treatment will depend on the severity of the condition. Mild thrush Mild cases of thrush may clear up with the use of an antifungal mouth rinse or lozenges. Treatment usually lasts about 14 days. Moderate to severe thrush  More severe thrush infections that have spread to the esophagus are treated with an oral antifungal medicine. A topical antifungal medicine may also be used.  For some severe infections, treatment may need to continue for more than 14 days.  Oral antifungal medicines are rarely used during pregnancy because they may be harmful to the unborn child. If you are pregnant, talk with your health care provider about options for treatment. Persistent or recurrent thrush For cases of thrush that do not go away or keep coming back:  Treatment may be needed twice as long as the symptoms last.  Treatment will include both oral and topical antifungal medicines.  People with a weakened immune system can take   an antifungal medicine on a continuous basis to prevent thrush infections. It is important to treat conditions that make a person more likely to get thrush, such as diabetes or HIV. Follow these instructions at home: Medicines  Take over-the-counter and prescription medicines only as told by your health care provider.  Talk with  your health care provider about an over-the-counter medicine called gentian violet, which kills bacteria and fungi. Relieving soreness and discomfort To help reduce the discomfort of thrush:  Drink cold liquids such as water or iced tea.  Try flavored ice treats or frozen juices.  Eat foods that are easy to swallow, such as gelatin, ice cream, or custard.  Try drinking from a straw if the patches in your mouth are painful.  General instructions  Eat plain, unflavored yogurt as directed by your health care provider. Check the label to make sure the yogurt contains live cultures. This yogurt can help healthy bacteria to grow in the mouth and can stop the growth of the fungus that causes thrush.  If you wear dentures, remove the dentures before going to bed, brush them vigorously, and soak them in a cleaning solution as directed by your health care provider.  Rinse your mouth with a warm salt-water mixture several times a day. To make a salt-water mixture, completely dissolve 1/2-1 tsp of salt in 1 cup of warm water. Contact a health care provider if:  Your symptoms are getting worse or are not improving within 7 days of starting treatment.  You have symptoms of a spreading infection, such as white patches on the skin outside of the mouth. This information is not intended to replace advice given to you by your health care provider. Make sure you discuss any questions you have with your health care provider. Document Released: 09/04/2004 Document Revised: 03/14/2018 Document Reviewed: 09/03/2016 Elsevier Patient Education  2020 [employer].

## 2019-07-06 NOTE — Progress Notes (Signed)
Patient: Shannon Grimes Female    DOB: [birth year]   68 y.o.   MRN: [id number] Visit Date: 07/06/2019  Today's Provider: [name], MD   Chief Complaint  Patient presents with  . Shortness of Breath   Subjective:     HPI   Patient states she has been having dry mouth for 2 months. Patient also states she has a fil in her mouth that is slimy and can gather in the back of her throat. Patient also has complaints of shortness of breath upon exertion.   States that since her Orthopedic issues, she has poor appetite.  Has had R ankle pain.  Dr Gerrit Heck is worried about infection and issues with hardware.  Bone scan upcoming. ESR and CRP are very high.  Antibiotics several times in the last few months.  Feeling in her mouth seemed to start around the time of her last surgery  She states that the dyspnea on exertion is similar to what she had 1 year ago.  She was seen by cardiology at that time and had echo, EKG, CT cardiac and everything was benign.  She states that the dyspnea is intermittent and sometimes she will be able to walk a distance without dyspnea but other times she cannot.  She is no longer able to swim as this makes her too short of breath.  She denies any chest pain, wheezing, fevers, cough   Allergies  Allergen Reactions  . Pentazocine Anaphylaxis  . Statins Other (See Comments)    pancreatitis  . Morphine Nausea And Vomiting    And migraine     Current Outpatient Medications:  .  cyanocobalamin (,VITAMIN B-12,) 1000 MCG/ML injection, INJECT 1 ML AS DIRECTED EVERY 30 (THIRTY) DAYS., Disp: , Rfl: 11 .  estradiol (ESTRACE) 2 MG tablet, Take 1 tablet (2 mg total) by mouth daily., Disp: 90 tablet, Rfl: 3 .  gabapentin (NEURONTIN) 400 MG capsule, Take 1 capsule (400 mg total) by mouth 3 (three) times daily. (Patient taking differently: Take 400 mg by mouth 3 (three) times daily. Takes 2-3 times daily depending on schedule), Disp: 270 capsule, Rfl: 3 .   hydrochlorothiazide (HYDRODIURIL) 25 MG tablet, Take 1 tablet (25 mg total) by mouth daily., Disp: 90 tablet, Rfl: 3 .  lisinopril (PRINIVIL,ZESTRIL) 10 MG tablet, Take 1 tablet (10 mg total) by mouth daily., Disp: 90 tablet, Rfl: 3 .  lubiprostone (AMITIZA) 24 MCG capsule, Take 1 capsule (24 mcg total) by mouth 2 (two) times daily with a meal., Disp: 180 capsule, Rfl: 1 .  nortriptyline (PAMELOR) 50 MG capsule, Take 2 capsules (100 mg total) by mouth at bedtime., Disp: 180 capsule, Rfl: 3 .  oxyCODONE-acetaminophen (PERCOCET) 10-325 MG tablet, Take 1 tablet by mouth every 6 (six) hours as needed., Disp: , Rfl: 0 .  OXYCONTIN 20 MG 12 hr tablet, Take 20 mg by mouth every 12 (twelve) hours., Disp: , Rfl: 0 .  promethazine (PHENERGAN) 25 MG tablet, Take 1 tablet by mouth every 4 (four) hours as needed., Disp: , Rfl:  .  SUMAtriptan (IMITREX) 100 MG tablet, Take 1 tablet (100 mg total) by mouth daily as needed., Disp: 10 tablet, Rfl: 5 .  tiZANidine (ZANAFLEX) 4 MG tablet, Take 1 tablet (4 mg total) by mouth 2 (two) times daily. (Patient taking differently: Take 4 mg by mouth 2 (two) times daily. Taking 1-2 times daily.), Disp: 180 tablet, Rfl: 2 .  diclofenac sodium (VOLTAREN) 1 % GEL, Voltaren 1 %  topical gel  APPLY 4 GRAM TO THE AFFECTED AREA(S) BY TOPICAL ROUTE AS NEEDED, Disp: , Rfl:   Review of Systems  Constitutional: Negative for appetite change, chills, fatigue and fever.  Respiratory: Positive for shortness of breath. Negative for chest tightness.   Cardiovascular: Negative for chest pain and palpitations.  Gastrointestinal: Negative for abdominal pain, nausea and vomiting.  Neurological: Negative for dizziness and weakness.    Social History   Tobacco Use  . Smoking status: Former Smoker    Years: 10.00    Types: Cigarettes    Quit date: 01/23/1983    Years since quitting: 36.4  . Smokeless tobacco: Never Used  . Tobacco comment: was social smoker, not everyday  Substance Use  Topics  . Alcohol use: No      Objective:   BP 99/65 (BP Location: Right Arm, Patient Position: Sitting, Cuff Size: Large)   Pulse 88   Temp (!) 97.5 F (36.4 C) (Oral)   Resp 16   Ht '5\' 9"'  (1.753 m)   Wt 176 lb (79.8 kg)   SpO2 96%   BMI 25.99 kg/m  Vitals:   07/06/19 1443  BP: 99/65  Pulse: 88  Resp: 16  Temp: (!) 97.5 F (36.4 C)  TempSrc: Oral  SpO2: 96%  Weight: 176 lb (79.8 kg)  Height: '5\' 9"'  (1.753 m)     Physical Exam Vitals signs reviewed.  Constitutional:      Appearance: She is well-developed.  HENT:     Head: Normocephalic and atraumatic.     Mouth/Throat:     Comments: +thrush of tongue, OP Eyes:     Extraocular Movements: Extraocular movements intact.     Pupils: Pupils are equal, round, and reactive to light.  Neck:     Musculoskeletal: Neck supple.     Thyroid: No thyromegaly.  Cardiovascular:     Rate and Rhythm: Normal rate and regular rhythm.     Pulses: Normal pulses.     Heart sounds: Normal heart sounds.  Pulmonary:     Effort: Pulmonary effort is normal. No tachypnea or respiratory distress.     Breath sounds: Normal breath sounds. No wheezing or rhonchi.  Chest:     Chest wall: No tenderness.  Abdominal:     Palpations: Abdomen is soft.     Tenderness: There is no abdominal tenderness.  Lymphadenopathy:     Cervical: No cervical adenopathy.  Skin:    General: Skin is warm and dry.     Capillary Refill: Capillary refill takes less than 2 seconds.     Findings: No rash.  Neurological:     General: No focal deficit present.     Mental Status: She is alert and oriented to person, place, and time.  Psychiatric:        Mood and Affect: Mood normal.        Behavior: Behavior normal.      No results found for any visits on 07/06/19.     Assessment & Plan   1. Thrush -New problem -Likely related to recent antibiotic use -Likely contributing to the dry and slimy feel in her mouth and may be contributing to her decreased  appetite -Treat with nystatin swish and swallow until resolution -Discussed return precautions  2. Dyspnea on exertion -Longstanding problem that is intermittent in nature -Reassured patient that she had an extensive cardiac work-up last year for similar symptoms that was grossly unrevealing - Unclear what is causing her symptoms, but did discuss that  deconditioning related to recent orthopedic surgeries could be contributing - Discussed that if she does have a hardware infection in her ankle, we could consider repeat echo -Also discussed possibility of referral to pulmonology -Patient wishes to hold off on any further evaluation at this time while she is being worked up her her ankle  3. Essential hypertension 4. Hypotension due to drugs -New problem -May contribute somewhat to shortness of breath and episodes of lightheadedness - Noted to have low blood pressure daily -Will decrease HCTZ to 12.5 mg daily and continue lisinopril 10 mg daily -Discussed return precautions -Patient to monitor home blood pressures and let me know if this elevates more     Meds ordered this encounter  Medications  . nystatin (MYCOSTATIN) 100000 UNIT/ML suspension    Sig: Take 5 mLs (500,000 Units total) by mouth 4 (four) times daily.    Dispense:  60 mL    Refill:  0     Return if symptoms worsen or fail to improve.   The entirety of the information documented in the History of Present Illness, Review of Systems and Physical Exam were personally obtained by me. Portions of this information were initially documented by April Miller, CMA and reviewed by me for thoroughness and accuracy.    , [name], MD MPH [facility]

## 2019-07-07 DIAGNOSIS — Z981 Arthrodesis status: Secondary | ICD-10-CM | POA: Diagnosis not present

## 2019-07-07 DIAGNOSIS — M25571 Pain in right ankle and joints of right foot: Secondary | ICD-10-CM | POA: Diagnosis not present

## 2019-07-09 ENCOUNTER — Encounter: Payer: Self-pay | Admitting: Gastroenterology

## 2019-07-09 ENCOUNTER — Other Ambulatory Visit: Payer: Self-pay

## 2019-07-09 ENCOUNTER — Ambulatory Visit (INDEPENDENT_AMBULATORY_CARE_PROVIDER_SITE_OTHER): Payer: Medicare Other | Admitting: Gastroenterology

## 2019-07-09 VITALS — BP 128/66 | HR 103 | Temp 98.0°F | Ht 69.0 in | Wt 175.4 lb

## 2019-07-09 DIAGNOSIS — R1011 Right upper quadrant pain: Secondary | ICD-10-CM | POA: Diagnosis not present

## 2019-07-09 DIAGNOSIS — R140 Abdominal distension (gaseous): Secondary | ICD-10-CM | POA: Diagnosis not present

## 2019-07-09 DIAGNOSIS — K5903 Drug induced constipation: Secondary | ICD-10-CM | POA: Diagnosis not present

## 2019-07-09 MED ORDER — DICYCLOMINE HCL 10 MG PO CAPS: 10.0000 mg | ORAL_CAPSULE | Freq: Three times a day (TID) | ORAL | 2 refills | Status: DC

## 2019-07-09 NOTE — Progress Notes (Signed)
Jonathon Bellows MD, MRCP(U.K) [address]  [place]  [place], [place] [address]  Main: [phone]  Fax: [phone]   Primary Care Physician: Virginia Crews, MD  Primary Gastroenterologist:  Dr. Jonathon Bellows   Follow-up for drug-induced constipation  HPI: Shannon Grimes is a 67 y.o. female here to see me for a follow up . Seen initially on 01/13/2019 for abdominal pain . No regular NSAID use. S/p cholecystectomy. Constipation +,CT abdomen and pelvis 10/2018 : atrophy of pancreas. CBD 9 mm,  Labs 10/2018 : CMP- normal lft's, Hb 12.4. 01/22/2019 : MRCP dilated CBD chronic no filling defects, fatty liver  Interval history 02/17/2019 -07/09/2019  She has been doing well taking her Linzess.  She has been having a lot of issues related to her legs and has been seeing an Doctor, general practice.  She has been on multiple course of antibiotics for her teeth and for her orthopedic issues.  She has been having some occasional right upper quadrant pain after meals.  Bloating.  Postprandial discomfort.   Motegrity didn't help, tried linzess , was a bit better at 290 mcg but not completely resolved. In addition taken Senna as needed.Feels better after a bowel movement .    Current Outpatient Medications  Medication Sig Dispense Refill  . cyanocobalamin (,VITAMIN B-12,) 1000 MCG/ML injection INJECT 1 ML AS DIRECTED EVERY 30 (THIRTY) DAYS.  11  . diclofenac sodium (VOLTAREN) 1 % GEL Voltaren 1 % topical gel  APPLY 4 GRAM TO THE AFFECTED AREA(S) BY TOPICAL ROUTE AS NEEDED    . estradiol (ESTRACE) 2 MG tablet Take 1 tablet (2 mg total) by mouth daily. 90 tablet 3  . gabapentin (NEURONTIN) 400 MG capsule Take 1 capsule (400 mg total) by mouth 3 (three) times daily. (Patient taking differently: Take 400 mg by mouth 3 (three) times daily. Takes 2-3 times daily depending on schedule) 270 capsule 3  . hydrochlorothiazide (HYDRODIURIL) 25 MG tablet Take 1 tablet (25 mg total) by mouth daily. 90  tablet 3  . lisinopril (PRINIVIL,ZESTRIL) 10 MG tablet Take 1 tablet (10 mg total) by mouth daily. 90 tablet 3  . lubiprostone (AMITIZA) 24 MCG capsule Take 1 capsule (24 mcg total) by mouth 2 (two) times daily with a meal. 180 capsule 1  . nortriptyline (PAMELOR) 50 MG capsule Take 2 capsules (100 mg total) by mouth at bedtime. 180 capsule 3  . nystatin (MYCOSTATIN) 100000 UNIT/ML suspension Take 5 mLs (500,000 Units total) by mouth 4 (four) times daily. 60 mL 0  . oxyCODONE-acetaminophen (PERCOCET) 10-325 MG tablet Take 1 tablet by mouth every 6 (six) hours as needed.  0  . OXYCONTIN 20 MG 12 hr tablet Take 20 mg by mouth every 12 (twelve) hours.  0  . promethazine (PHENERGAN) 25 MG tablet Take 1 tablet by mouth every 4 (four) hours as needed.    . SUMAtriptan (IMITREX) 100 MG tablet Take 1 tablet (100 mg total) by mouth daily as needed. 10 tablet 5  . tiZANidine (ZANAFLEX) 4 MG tablet Take 1 tablet (4 mg total) by mouth 2 (two) times daily. (Patient taking differently: Take 4 mg by mouth 2 (two) times daily. Taking 1-2 times daily.) 180 tablet 2   No current facility-administered medications for this visit.     Allergies as of 07/09/2019 - Review Complete 07/06/2019  Allergen Reaction Noted  . Pentazocine Anaphylaxis   . Statins Other (See Comments)   . Morphine Nausea And Vomiting     ROS:  General: Negative for anorexia, weight loss, fever, chills, fatigue, weakness. ENT: Negative for hoarseness, difficulty swallowing , nasal congestion. CV: Negative for chest pain, angina, palpitations, dyspnea on exertion, peripheral edema.  Respiratory: Negative for dyspnea at rest, dyspnea on exertion, cough, sputum, wheezing.  GI: See history of present illness. GU:  Negative for dysuria, hematuria, urinary incontinence, urinary frequency, nocturnal urination.  Endo: Negative for unusual weight change.    Physical Examination:   There were no vitals taken for this visit.  General:  Well-nourished, well-developed in no acute distress.  Eyes: No icterus. Conjunctivae pink. Mouth: Oropharyngeal mucosa moist and pink , no lesions erythema or exudate. Lungs: Clear to auscultation bilaterally. Non-labored. Heart: Regular rate and rhythm, no murmurs rubs or gallops.  Abdomen: Bowel sounds are normal, nontender, nondistended, no hepatosplenomegaly or masses, no abdominal bruits or hernia , no rebound or guarding.   Extremities: No lower extremity edema. No clubbing or deformities. Neuro: Alert and oriented x 3.  Grossly intact. Skin: Warm and dry, no jaundice.   Psych: Alert and cooperative, normal mood and affect.   Imaging Studies: No results found.  Assessment and Plan:   Shannon Grimes is a 68 y.o. y/o female here to follow up for RUQ pain, S/p cholecystectomy, no filling defects on MRCP, norma;l LFT's .  Constipation stable on Linzess 24 mcg.  Some postprandial discomfort.  Probably related to change in bacterial flora due to multiple antibiotics has taken recently.  Plan  1.  Continue Amitiza 2.  Trial of Bentyl as needed to be taken before meals as needed for pain. 3.  If pain persist try activated charcoal.   Dr Jonathon Bellows  MD,MRCP [facility]) Follow up in as needed

## 2019-07-13 DIAGNOSIS — Z79899 Other long term (current) drug therapy: Secondary | ICD-10-CM | POA: Diagnosis not present

## 2019-07-13 DIAGNOSIS — M25559 Pain in unspecified hip: Secondary | ICD-10-CM | POA: Diagnosis not present

## 2019-07-14 DIAGNOSIS — M25571 Pain in right ankle and joints of right foot: Secondary | ICD-10-CM | POA: Diagnosis not present

## 2019-07-14 DIAGNOSIS — Z9889 Other specified postprocedural states: Secondary | ICD-10-CM | POA: Diagnosis not present

## 2019-07-15 ENCOUNTER — Ambulatory Visit: Payer: Self-pay

## 2019-07-21 ENCOUNTER — Ambulatory Visit (INDEPENDENT_AMBULATORY_CARE_PROVIDER_SITE_OTHER): Payer: Medicare Other

## 2019-07-21 ENCOUNTER — Other Ambulatory Visit: Payer: Self-pay

## 2019-07-21 DIAGNOSIS — E538 Deficiency of other specified B group vitamins: Secondary | ICD-10-CM

## 2019-07-21 MED ORDER — CYANOCOBALAMIN 1000 MCG/ML IJ SOLN
1000.0000 ug | Freq: Once | INTRAMUSCULAR | Status: AC
  Administered 2019-07-21: 1000 ug via INTRAMUSCULAR

## 2019-07-29 DIAGNOSIS — M25571 Pain in right ankle and joints of right foot: Secondary | ICD-10-CM | POA: Diagnosis not present

## 2019-08-17 DIAGNOSIS — M79672 Pain in left foot: Secondary | ICD-10-CM | POA: Diagnosis not present

## 2019-08-17 DIAGNOSIS — M25542 Pain in joints of left hand: Secondary | ICD-10-CM | POA: Diagnosis not present

## 2019-08-17 DIAGNOSIS — Z79899 Other long term (current) drug therapy: Secondary | ICD-10-CM | POA: Diagnosis not present

## 2019-08-17 DIAGNOSIS — M79671 Pain in right foot: Secondary | ICD-10-CM | POA: Diagnosis not present

## 2019-08-17 DIAGNOSIS — M25532 Pain in left wrist: Secondary | ICD-10-CM | POA: Diagnosis not present

## 2019-08-17 DIAGNOSIS — M25531 Pain in right wrist: Secondary | ICD-10-CM | POA: Diagnosis not present

## 2019-08-17 DIAGNOSIS — R7982 Elevated C-reactive protein (CRP): Secondary | ICD-10-CM | POA: Diagnosis not present

## 2019-08-17 DIAGNOSIS — M25541 Pain in joints of right hand: Secondary | ICD-10-CM | POA: Diagnosis not present

## 2019-08-18 ENCOUNTER — Ambulatory Visit (INDEPENDENT_AMBULATORY_CARE_PROVIDER_SITE_OTHER): Payer: Medicare Other

## 2019-08-18 ENCOUNTER — Other Ambulatory Visit: Payer: Self-pay

## 2019-08-18 VITALS — Temp 97.6°F

## 2019-08-18 DIAGNOSIS — E538 Deficiency of other specified B group vitamins: Secondary | ICD-10-CM | POA: Diagnosis not present

## 2019-08-18 MED ORDER — CYANOCOBALAMIN 1000 MCG/ML IJ SOLN
1000.0000 ug | Freq: Once | INTRAMUSCULAR | Status: AC
  Administered 2019-08-18: 1000 ug via INTRAMUSCULAR

## 2019-08-18 NOTE — Progress Notes (Signed)
B12 Injection administered in the left deltoid

## 2019-08-21 ENCOUNTER — Encounter: Payer: Self-pay | Admitting: Family Medicine

## 2019-08-21 MED ORDER — PROMETHAZINE HCL 25 MG PO TABS: 25.0000 mg | ORAL_TABLET | ORAL | 1 refills | Status: DC | PRN

## 2019-08-24 ENCOUNTER — Other Ambulatory Visit: Payer: Self-pay

## 2019-08-24 MED ORDER — LUBIPROSTONE 24 MCG PO CAPS: 24.0000 ug | ORAL_CAPSULE | Freq: Two times a day (BID) | ORAL | 1 refills | Status: DC

## 2019-08-25 DIAGNOSIS — M19041 Primary osteoarthritis, right hand: Secondary | ICD-10-CM | POA: Diagnosis not present

## 2019-08-25 DIAGNOSIS — M7731 Calcaneal spur, right foot: Secondary | ICD-10-CM | POA: Diagnosis not present

## 2019-08-25 DIAGNOSIS — M19042 Primary osteoarthritis, left hand: Secondary | ICD-10-CM | POA: Diagnosis not present

## 2019-08-25 DIAGNOSIS — M79671 Pain in right foot: Secondary | ICD-10-CM | POA: Diagnosis not present

## 2019-08-25 DIAGNOSIS — M79642 Pain in left hand: Secondary | ICD-10-CM | POA: Diagnosis not present

## 2019-08-25 DIAGNOSIS — M7732 Calcaneal spur, left foot: Secondary | ICD-10-CM | POA: Diagnosis not present

## 2019-08-25 DIAGNOSIS — M79672 Pain in left foot: Secondary | ICD-10-CM | POA: Diagnosis not present

## 2019-08-25 DIAGNOSIS — M79641 Pain in right hand: Secondary | ICD-10-CM | POA: Diagnosis not present

## 2019-09-11 DIAGNOSIS — M25532 Pain in left wrist: Secondary | ICD-10-CM | POA: Diagnosis not present

## 2019-09-11 DIAGNOSIS — Z130 Encounter for screening for diseases of the blood and blood-forming organs and certain disorders involving the immune mechanism: Secondary | ICD-10-CM | POA: Diagnosis not present

## 2019-09-11 DIAGNOSIS — M25531 Pain in right wrist: Secondary | ICD-10-CM | POA: Diagnosis not present

## 2019-09-11 DIAGNOSIS — R7982 Elevated C-reactive protein (CRP): Secondary | ICD-10-CM | POA: Diagnosis not present

## 2019-09-11 DIAGNOSIS — M79671 Pain in right foot: Secondary | ICD-10-CM | POA: Diagnosis not present

## 2019-09-11 DIAGNOSIS — M461 Sacroiliitis, not elsewhere classified: Secondary | ICD-10-CM | POA: Diagnosis not present

## 2019-09-11 DIAGNOSIS — M25542 Pain in joints of left hand: Secondary | ICD-10-CM | POA: Diagnosis not present

## 2019-09-11 DIAGNOSIS — M79672 Pain in left foot: Secondary | ICD-10-CM | POA: Diagnosis not present

## 2019-09-11 DIAGNOSIS — M25541 Pain in joints of right hand: Secondary | ICD-10-CM | POA: Diagnosis not present

## 2019-09-14 DIAGNOSIS — M79671 Pain in right foot: Secondary | ICD-10-CM | POA: Diagnosis not present

## 2019-09-14 DIAGNOSIS — M25559 Pain in unspecified hip: Secondary | ICD-10-CM | POA: Diagnosis not present

## 2019-09-14 DIAGNOSIS — M79672 Pain in left foot: Secondary | ICD-10-CM | POA: Diagnosis not present

## 2019-09-14 DIAGNOSIS — M25541 Pain in joints of right hand: Secondary | ICD-10-CM | POA: Diagnosis not present

## 2019-09-14 DIAGNOSIS — Z79891 Long term (current) use of opiate analgesic: Secondary | ICD-10-CM | POA: Diagnosis not present

## 2019-09-14 DIAGNOSIS — M25542 Pain in joints of left hand: Secondary | ICD-10-CM | POA: Diagnosis not present

## 2019-09-14 DIAGNOSIS — Z79899 Other long term (current) drug therapy: Secondary | ICD-10-CM | POA: Diagnosis not present

## 2019-09-14 DIAGNOSIS — R7982 Elevated C-reactive protein (CRP): Secondary | ICD-10-CM | POA: Diagnosis not present

## 2019-09-14 DIAGNOSIS — Z5181 Encounter for therapeutic drug level monitoring: Secondary | ICD-10-CM | POA: Diagnosis not present

## 2019-09-14 DIAGNOSIS — M25532 Pain in left wrist: Secondary | ICD-10-CM | POA: Diagnosis not present

## 2019-09-18 ENCOUNTER — Other Ambulatory Visit: Payer: Self-pay | Admitting: Family Medicine

## 2019-09-22 ENCOUNTER — Ambulatory Visit: Payer: Self-pay

## 2019-09-22 DIAGNOSIS — M25542 Pain in joints of left hand: Secondary | ICD-10-CM | POA: Diagnosis not present

## 2019-09-22 DIAGNOSIS — M79672 Pain in left foot: Secondary | ICD-10-CM | POA: Diagnosis not present

## 2019-09-22 DIAGNOSIS — E790 Hyperuricemia without signs of inflammatory arthritis and tophaceous disease: Secondary | ICD-10-CM | POA: Diagnosis not present

## 2019-09-22 DIAGNOSIS — R7982 Elevated C-reactive protein (CRP): Secondary | ICD-10-CM | POA: Diagnosis not present

## 2019-09-22 DIAGNOSIS — M25531 Pain in right wrist: Secondary | ICD-10-CM | POA: Diagnosis not present

## 2019-09-22 DIAGNOSIS — L4050 Arthropathic psoriasis, unspecified: Secondary | ICD-10-CM | POA: Diagnosis not present

## 2019-09-22 DIAGNOSIS — Z79899 Other long term (current) drug therapy: Secondary | ICD-10-CM | POA: Diagnosis not present

## 2019-09-22 DIAGNOSIS — M25532 Pain in left wrist: Secondary | ICD-10-CM | POA: Diagnosis not present

## 2019-09-22 DIAGNOSIS — M25541 Pain in joints of right hand: Secondary | ICD-10-CM | POA: Diagnosis not present

## 2019-09-22 DIAGNOSIS — M79671 Pain in right foot: Secondary | ICD-10-CM | POA: Diagnosis not present

## 2019-09-23 ENCOUNTER — Ambulatory Visit (INDEPENDENT_AMBULATORY_CARE_PROVIDER_SITE_OTHER): Payer: Medicare Other

## 2019-09-23 ENCOUNTER — Ambulatory Visit: Payer: Medicare Other

## 2019-09-23 ENCOUNTER — Other Ambulatory Visit: Payer: Self-pay

## 2019-09-23 DIAGNOSIS — E538 Deficiency of other specified B group vitamins: Secondary | ICD-10-CM

## 2019-09-23 MED ORDER — CYANOCOBALAMIN 1000 MCG/ML IJ SOLN
1000.0000 ug | Freq: Once | INTRAMUSCULAR | Status: AC
  Administered 2019-09-23: 1000 ug via INTRAMUSCULAR

## 2019-10-08 ENCOUNTER — Ambulatory Visit
Admission: RE | Admit: 2019-10-08 | Discharge: 2019-10-08 | Disposition: A | Discharge status: Home / Self Care | Payer: Medicare Other | Source: Ambulatory Visit | Attending: Family Medicine | Admitting: Family Medicine

## 2019-10-08 DIAGNOSIS — Z1231 Encounter for screening mammogram for malignant neoplasm of breast: Secondary | ICD-10-CM | POA: Diagnosis not present

## 2019-10-09 ENCOUNTER — Encounter: Payer: Self-pay | Admitting: Family Medicine

## 2019-10-09 DIAGNOSIS — I10 Essential (primary) hypertension: Secondary | ICD-10-CM

## 2019-10-09 MED ORDER — HYDROCHLOROTHIAZIDE 12.5 MG PO TABS: 12.5000 mg | ORAL_TABLET | Freq: Every day | ORAL | 3 refills | Status: DC

## 2019-10-16 DIAGNOSIS — E790 Hyperuricemia without signs of inflammatory arthritis and tophaceous disease: Secondary | ICD-10-CM | POA: Diagnosis not present

## 2019-10-16 DIAGNOSIS — M79672 Pain in left foot: Secondary | ICD-10-CM | POA: Diagnosis not present

## 2019-10-16 DIAGNOSIS — R7982 Elevated C-reactive protein (CRP): Secondary | ICD-10-CM | POA: Diagnosis not present

## 2019-10-16 DIAGNOSIS — M25531 Pain in right wrist: Secondary | ICD-10-CM | POA: Diagnosis not present

## 2019-10-16 DIAGNOSIS — M25541 Pain in joints of right hand: Secondary | ICD-10-CM | POA: Diagnosis not present

## 2019-10-16 DIAGNOSIS — M25532 Pain in left wrist: Secondary | ICD-10-CM | POA: Diagnosis not present

## 2019-10-16 DIAGNOSIS — Z79899 Other long term (current) drug therapy: Secondary | ICD-10-CM | POA: Diagnosis not present

## 2019-10-16 DIAGNOSIS — M25542 Pain in joints of left hand: Secondary | ICD-10-CM | POA: Diagnosis not present

## 2019-10-16 DIAGNOSIS — M79671 Pain in right foot: Secondary | ICD-10-CM | POA: Diagnosis not present

## 2019-10-20 ENCOUNTER — Ambulatory Visit: Payer: Medicare Other

## 2019-10-20 DIAGNOSIS — R7982 Elevated C-reactive protein (CRP): Secondary | ICD-10-CM | POA: Diagnosis not present

## 2019-10-20 DIAGNOSIS — L4050 Arthropathic psoriasis, unspecified: Secondary | ICD-10-CM | POA: Diagnosis not present

## 2019-10-20 DIAGNOSIS — E790 Hyperuricemia without signs of inflammatory arthritis and tophaceous disease: Secondary | ICD-10-CM | POA: Diagnosis not present

## 2019-10-20 DIAGNOSIS — Z79899 Other long term (current) drug therapy: Secondary | ICD-10-CM | POA: Diagnosis not present

## 2019-10-21 ENCOUNTER — Ambulatory Visit (INDEPENDENT_AMBULATORY_CARE_PROVIDER_SITE_OTHER): Payer: Medicare Other

## 2019-10-21 ENCOUNTER — Ambulatory Visit: Payer: Self-pay

## 2019-10-21 ENCOUNTER — Other Ambulatory Visit: Payer: Self-pay

## 2019-10-21 DIAGNOSIS — Z23 Encounter for immunization: Secondary | ICD-10-CM

## 2019-10-27 ENCOUNTER — Other Ambulatory Visit: Payer: Self-pay

## 2019-10-27 ENCOUNTER — Ambulatory Visit (INDEPENDENT_AMBULATORY_CARE_PROVIDER_SITE_OTHER): Payer: Medicare Other

## 2019-10-27 DIAGNOSIS — E538 Deficiency of other specified B group vitamins: Secondary | ICD-10-CM | POA: Diagnosis not present

## 2019-10-27 MED ORDER — CYANOCOBALAMIN 1000 MCG/ML IJ SOLN
1000.0000 ug | Freq: Once | INTRAMUSCULAR | Status: AC
  Administered 2019-10-27: 1000 ug via INTRAMUSCULAR

## 2019-11-04 ENCOUNTER — Other Ambulatory Visit: Payer: Self-pay | Admitting: Family Medicine

## 2019-11-11 ENCOUNTER — Encounter: Payer: Self-pay | Admitting: Family Medicine

## 2019-11-11 ENCOUNTER — Ambulatory Visit (INDEPENDENT_AMBULATORY_CARE_PROVIDER_SITE_OTHER): Payer: Medicare Other | Admitting: Family Medicine

## 2019-11-11 ENCOUNTER — Other Ambulatory Visit: Payer: Self-pay

## 2019-11-11 VITALS — BP 115/76 | HR 90 | Temp 96.9°F | Wt 163.0 lb

## 2019-11-11 DIAGNOSIS — I10 Essential (primary) hypertension: Secondary | ICD-10-CM | POA: Diagnosis not present

## 2019-11-11 DIAGNOSIS — E785 Hyperlipidemia, unspecified: Secondary | ICD-10-CM | POA: Diagnosis not present

## 2019-11-11 DIAGNOSIS — R7303 Prediabetes: Secondary | ICD-10-CM | POA: Insufficient documentation

## 2019-11-11 DIAGNOSIS — E538 Deficiency of other specified B group vitamins: Secondary | ICD-10-CM

## 2019-11-11 MED ORDER — TRIAMCINOLONE ACETONIDE 0.5 % EX OINT: 1.0000 "application " | TOPICAL_OINTMENT | Freq: Two times a day (BID) | CUTANEOUS | 0 refills | Status: DC

## 2019-11-11 NOTE — Assessment & Plan Note (Signed)
A1c being checked routinely by Rheum Will review when patient forwards labs Encouraged low carb diet and exercise

## 2019-11-11 NOTE — Assessment & Plan Note (Addendum)
Will review recent B12 level

## 2019-11-11 NOTE — Progress Notes (Signed)
Patient: Shannon Grimes Female    DOB: 13-Apr-1951   68 y.o.   MRN: [id number] Visit Date: 11/11/2019  Today's Provider: [name], MD   Chief Complaint  Patient presents with  . Hyperlipidemia  . Hypertension   Subjective:     Hyperlipidemia This is a chronic problem. Associated symptoms include shortness of breath. Pertinent negatives include no myalgias.     Hypertension, follow-up:  BP Readings from Last 3 Encounters:  11/11/19 115/76  07/09/19 128/66  07/06/19 99/65    She was last seen for hypertension 6 months ago.  BP at that visit was 99/65. Management since that visit includes Decreased HCTZ 12.5mg . She reports excellent compliance with treatment. She is not having side effects.  She is exercising. She is adherent to low salt diet.   Outside blood pressures are 100-110's/60-70's. She is experiencing none.  Patient denies chest pain, fatigue, palpitations and paroxysmal nocturnal dyspnea.   Cardiovascular risk factors include advanced age (older than 55 for men, 33 for women) and hypertension.  Use of agents associated with hypertension: none.     Weight trend: stable Wt Readings from Last 3 Encounters:  11/11/19 163 lb (73.9 kg)  07/09/19 175 lb 6.4 oz (79.6 kg)  07/06/19 176 lb (79.8 kg)    Current diet: in general, a "healthy" diet    ------------------------------------------------------------------------ Now seeing Rheumatology for new diagnosis of psoriatic arthritis.  She is taking Methotrexate, Folic acid, and Celebrex.  She is starting to improve.  She is going to forward some lab work to me through [employer] from Last 3 Encounters:  11/11/19 163 lb (73.9 kg)  07/09/19 175 lb 6.4 oz (79.6 kg)  07/06/19 176 lb (79.8 kg)   Lab Results  Component Value Date   CHOL 159 10/28/2018   CHOL 211 (H) 10/16/2017   CHOL 200 07/05/2016   Lab Results  Component Value Date   HDL 26 (L) 10/28/2018   HDL 41 (L)  10/16/2017   HDL 36 07/05/2016   Lab Results  Component Value Date   LDLCALC 71 10/28/2018   LDLCALC 132 (H) 10/16/2017   LDLCALC 91 07/05/2016   Lab Results  Component Value Date   TRIG 308 (H) 10/28/2018   TRIG 250 (H) 10/16/2017   TRIG 367 (A) 07/05/2016   Lab Results  Component Value Date   CHOLHDL 6.1 (H) 10/28/2018   CHOLHDL 5.1 (H) 10/16/2017   No results found for: LDLDIRECT   Allergies  Allergen Reactions  . Pentazocine Anaphylaxis  . Statins Other (See Comments)    pancreatitis  . Morphine Nausea And Vomiting    And migraine     Current Outpatient Medications:  .  celecoxib (CELEBREX) 200 MG capsule, Take 200 mg by mouth daily as needed., Disp: , Rfl:  .  cyanocobalamin (,VITAMIN B-12,) 1000 MCG/ML injection, INJECT 1 ML AS DIRECTED EVERY 30 (THIRTY) DAYS., Disp: , Rfl: 11 .  estradiol (ESTRACE) 2 MG tablet, TAKE 1 TABLET BY MOUTH  DAILY, Disp: 90 tablet, Rfl: 3 .  folic acid (FOLVITE) 1 MG tablet, folic acid 1 mg tablet  TK 1 T PO QD, Disp: , Rfl:  .  gabapentin (NEURONTIN) 400 MG capsule, Take 1 capsule (400 mg total) by mouth 3 (three) times daily. (Patient taking differently: Take 400 mg by mouth 3 (three) times daily. Takes 2-3 times daily depending on schedule), Disp: 270 capsule, Rfl: 3 .  hydrochlorothiazide (HYDRODIURIL) 12.5 MG tablet, Take  1 tablet (12.5 mg total) by mouth daily., Disp: 90 tablet, Rfl: 3 .  lisinopril (ZESTRIL) 10 MG tablet, TAKE 1 TABLET BY MOUTH  DAILY, Disp: 90 tablet, Rfl: 3 .  lubiprostone (AMITIZA) 24 MCG capsule, Take 1 capsule (24 mcg total) by mouth 2 (two) times daily with a meal., Disp: 180 capsule, Rfl: 1 .  methotrexate (RHEUMATREX) 2.5 MG tablet, methotrexate sodium 2.5 mg tablet  TK 4 TS PO 1 TIME WEEKLY FOR 2 WKS THEN TK 5 TS PO 1 TIME WEEKLY FOR 2 WKS THEN TK 6 TS PO 1 TIME WEEKLY, Disp: , Rfl:  .  nortriptyline (PAMELOR) 50 MG capsule, Take 2 capsules (100 mg total) by mouth at bedtime., Disp: 180 capsule, Rfl: 3 .   oxyCODONE-acetaminophen (PERCOCET) 10-325 MG tablet, Take 1 tablet by mouth every 6 (six) hours as needed., Disp: , Rfl: 0 .  OXYCONTIN 20 MG 12 hr tablet, Take 20 mg by mouth every 12 (twelve) hours., Disp: , Rfl: 0 .  promethazine (PHENERGAN) 25 MG tablet, Take 1 tablet (25 mg total) by mouth every 4 (four) hours as needed., Disp: 30 tablet, Rfl: 1 .  SUMAtriptan (IMITREX) 100 MG tablet, Take 1 tablet (100 mg total) by mouth daily as needed., Disp: 10 tablet, Rfl: 5 .  tiZANidine (ZANAFLEX) 4 MG tablet, Take 1 tablet (4 mg total) by mouth 2 (two) times daily. (Patient taking differently: Take 4 mg by mouth 2 (two) times daily. Taking 1-2 times daily.), Disp: 180 tablet, Rfl: 2 .  dicyclomine (BENTYL) 10 MG capsule, Take 1 capsule (10 mg total) by mouth 4 (four) times daily -  before meals and at bedtime., Disp: 120 capsule, Rfl: 2  Review of Systems  Constitutional: Negative.   Respiratory: Positive for shortness of breath. Negative for apnea, cough, choking, chest tightness, wheezing and stridor.   Cardiovascular: Negative.   Gastrointestinal: Negative.   Musculoskeletal: Positive for arthralgias. Negative for back pain, gait problem, joint swelling, myalgias, neck pain and neck stiffness.  Skin: Positive for rash. Negative for color change, pallor and wound.  Neurological: Negative for dizziness, light-headedness and headaches.    Social History   Tobacco Use  . Smoking status: Former Smoker    Years: 10.00    Types: Cigarettes    Quit date: 01/23/1983    Years since quitting: 36.8  . Smokeless tobacco: Never Used  . Tobacco comment: was social smoker, not everyday  Substance Use Topics  . Alcohol use: No      Objective:   BP 115/76 (BP Location: Left Arm, Patient Position: Sitting, Cuff Size: Large)   Pulse 90   Temp (!) 96.9 F (36.1 C) (Temporal)   Wt 163 lb (73.9 kg)   BMI 24.07 kg/m  Vitals:   11/11/19 0947  BP: 115/76  Pulse: 90  Temp: (!) 96.9 F (36.1 C)   TempSrc: Temporal  Weight: 163 lb (73.9 kg)  Body mass index is 24.07 kg/m.   Physical Exam Vitals signs reviewed.  Constitutional:      General: She is not in acute distress.    Appearance: Normal appearance. She is well-developed. She is not diaphoretic.  HENT:     Head: Normocephalic and atraumatic.  Eyes:     General: No scleral icterus.    Conjunctiva/sclera: Conjunctivae normal.  Neck:     Musculoskeletal: Neck supple.     Thyroid: No thyromegaly.  Cardiovascular:     Rate and Rhythm: Normal rate and regular rhythm.  Pulses: Normal pulses.     Heart sounds: Normal heart sounds. No murmur.  Pulmonary:     Effort: Pulmonary effort is normal. No respiratory distress.     Breath sounds: Normal breath sounds. No wheezing, rhonchi or rales.  Musculoskeletal:     Right lower leg: No edema.     Left lower leg: No edema.  Lymphadenopathy:     Cervical: No cervical adenopathy.  Skin:    General: Skin is warm and dry.     Capillary Refill: Capillary refill takes less than 2 seconds.     Findings: No rash.  Neurological:     Mental Status: She is alert and oriented to person, place, and time. Mental status is at baseline.  Psychiatric:        Mood and Affect: Mood normal.        Behavior: Behavior normal.      No results found for any visits on 11/11/19.     Assessment & Plan   Problem List Items Addressed This Visit      Cardiovascular and Mediastinum   Hypertension - Primary    Well controlled Continue current medications Recheck metabolic panel F/u in 6 months        Other   Hyperlipidemia    Not currently on a statin Was in a clinical study for lipids Intolerant to statins due to pancreatitis Recheck FLP Discussed diet and exercise      Relevant Orders   Lipid panel   Vitamin B12 deficiency    Will review recent B12 level      Prediabetes    A1c being checked routinely by Rheum Will review when patient forwards labs Encouraged low carb  diet and exercise          Return in about 6 months (around 05/10/2020) for CPE/AWV.   The entirety of the information documented in the History of Present Illness, Review of Systems and Physical Exam were personally obtained by me. Portions of this information were initially documented by Ashley Royalty, CMA and reviewed by me for thoroughness and accuracy.    B[place], [name], MD MPH [facility]

## 2019-11-11 NOTE — Assessment & Plan Note (Signed)
Not currently on a statin Was in a clinical study for lipids Intolerant to statins due to pancreatitis Recheck FLP Discussed diet and exercise

## 2019-11-11 NOTE — Assessment & Plan Note (Signed)
Well controlled Continue current medications Recheck metabolic panel F/u in 6 months  

## 2019-11-12 LAB — LIPID PANEL
Chol/HDL Ratio: 4.7 ratio — ABNORMAL HIGH (ref 0.0–4.4)
Cholesterol, Total: 146 mg/dL (ref 100–199)
HDL: 31 mg/dL — ABNORMAL LOW (ref >39)
LDL Chol Calc (NIH): 84 mg/dL (ref 0–99)
Triglycerides: 180 mg/dL — ABNORMAL HIGH (ref 0–149)
VLDL Cholesterol Cal: 31 mg/dL (ref 5–40)

## 2019-11-20 ENCOUNTER — Encounter: Payer: Self-pay | Admitting: Family Medicine

## 2019-11-23 MED ORDER — LISINOPRIL 10 MG PO TABS: 10.0000 mg | ORAL_TABLET | Freq: Every day | ORAL | 3 refills | Status: DC

## 2019-11-24 ENCOUNTER — Ambulatory Visit (INDEPENDENT_AMBULATORY_CARE_PROVIDER_SITE_OTHER): Payer: Medicare Other

## 2019-11-24 ENCOUNTER — Other Ambulatory Visit: Payer: Self-pay

## 2019-11-24 DIAGNOSIS — E538 Deficiency of other specified B group vitamins: Secondary | ICD-10-CM | POA: Diagnosis not present

## 2019-11-24 MED ORDER — CYANOCOBALAMIN 1000 MCG/ML IJ SOLN
1000.0000 ug | Freq: Once | INTRAMUSCULAR | Status: AC
  Administered 2019-11-24: 1000 ug via INTRAMUSCULAR

## 2019-11-25 DIAGNOSIS — M25571 Pain in right ankle and joints of right foot: Secondary | ICD-10-CM | POA: Diagnosis not present

## 2019-11-25 DIAGNOSIS — Z96641 Presence of right artificial hip joint: Secondary | ICD-10-CM | POA: Diagnosis not present

## 2019-11-25 DIAGNOSIS — T84020A Dislocation of internal right hip prosthesis, initial encounter: Secondary | ICD-10-CM | POA: Diagnosis not present

## 2019-11-25 DIAGNOSIS — R7982 Elevated C-reactive protein (CRP): Secondary | ICD-10-CM | POA: Diagnosis not present

## 2019-12-13 ENCOUNTER — Other Ambulatory Visit: Payer: Self-pay | Admitting: Gastroenterology

## 2019-12-13 ENCOUNTER — Other Ambulatory Visit: Payer: Self-pay | Admitting: Family Medicine

## 2019-12-22 ENCOUNTER — Ambulatory Visit: Payer: Medicare Other

## 2019-12-29 ENCOUNTER — Other Ambulatory Visit: Payer: Self-pay

## 2019-12-29 ENCOUNTER — Ambulatory Visit (INDEPENDENT_AMBULATORY_CARE_PROVIDER_SITE_OTHER): Payer: Medicare Other

## 2019-12-29 DIAGNOSIS — E538 Deficiency of other specified B group vitamins: Secondary | ICD-10-CM

## 2019-12-29 MED ORDER — CYANOCOBALAMIN 1000 MCG/ML IJ SOLN
1000.0000 ug | Freq: Once | INTRAMUSCULAR | Status: AC
  Administered 2019-12-29: 1000 ug via INTRAMUSCULAR

## 2019-12-30 DIAGNOSIS — H40001 Preglaucoma, unspecified, right eye: Secondary | ICD-10-CM | POA: Diagnosis not present

## 2020-01-04 ENCOUNTER — Encounter: Payer: Self-pay | Admitting: Family Medicine

## 2020-01-08 DIAGNOSIS — M533 Sacrococcygeal disorders, not elsewhere classified: Secondary | ICD-10-CM | POA: Diagnosis not present

## 2020-01-18 DIAGNOSIS — M533 Sacrococcygeal disorders, not elsewhere classified: Secondary | ICD-10-CM | POA: Diagnosis not present

## 2020-01-18 DIAGNOSIS — Z96641 Presence of right artificial hip joint: Secondary | ICD-10-CM | POA: Diagnosis not present

## 2020-01-18 DIAGNOSIS — M25551 Pain in right hip: Secondary | ICD-10-CM | POA: Diagnosis not present

## 2020-01-26 ENCOUNTER — Other Ambulatory Visit: Payer: Self-pay

## 2020-01-26 ENCOUNTER — Ambulatory Visit (INDEPENDENT_AMBULATORY_CARE_PROVIDER_SITE_OTHER): Payer: Medicare Other

## 2020-01-26 DIAGNOSIS — E538 Deficiency of other specified B group vitamins: Secondary | ICD-10-CM

## 2020-01-26 MED ORDER — CYANOCOBALAMIN 1000 MCG/ML IJ SOLN
1000.0000 ug | Freq: Once | INTRAMUSCULAR | Status: AC
  Administered 2020-01-26: 1000 ug via INTRAMUSCULAR

## 2020-01-29 DIAGNOSIS — M5136 Other intervertebral disc degeneration, lumbar region: Secondary | ICD-10-CM | POA: Diagnosis not present

## 2020-02-03 DIAGNOSIS — Z79899 Other long term (current) drug therapy: Secondary | ICD-10-CM | POA: Diagnosis not present

## 2020-02-03 DIAGNOSIS — M25559 Pain in unspecified hip: Secondary | ICD-10-CM | POA: Diagnosis not present

## 2020-02-03 DIAGNOSIS — M25541 Pain in joints of right hand: Secondary | ICD-10-CM | POA: Diagnosis not present

## 2020-02-03 DIAGNOSIS — M79671 Pain in right foot: Secondary | ICD-10-CM | POA: Diagnosis not present

## 2020-02-03 DIAGNOSIS — M25531 Pain in right wrist: Secondary | ICD-10-CM | POA: Diagnosis not present

## 2020-02-03 DIAGNOSIS — M25532 Pain in left wrist: Secondary | ICD-10-CM | POA: Diagnosis not present

## 2020-02-03 DIAGNOSIS — R7982 Elevated C-reactive protein (CRP): Secondary | ICD-10-CM | POA: Diagnosis not present

## 2020-02-03 DIAGNOSIS — M25542 Pain in joints of left hand: Secondary | ICD-10-CM | POA: Diagnosis not present

## 2020-02-03 DIAGNOSIS — M79672 Pain in left foot: Secondary | ICD-10-CM | POA: Diagnosis not present

## 2020-02-14 ENCOUNTER — Encounter: Payer: Self-pay | Admitting: Family Medicine

## 2020-02-14 DIAGNOSIS — I10 Essential (primary) hypertension: Secondary | ICD-10-CM

## 2020-02-15 ENCOUNTER — Other Ambulatory Visit: Payer: Self-pay

## 2020-02-15 DIAGNOSIS — M48061 Spinal stenosis, lumbar region without neurogenic claudication: Secondary | ICD-10-CM | POA: Diagnosis not present

## 2020-02-15 DIAGNOSIS — M5126 Other intervertebral disc displacement, lumbar region: Secondary | ICD-10-CM | POA: Diagnosis not present

## 2020-02-15 DIAGNOSIS — M5136 Other intervertebral disc degeneration, lumbar region: Secondary | ICD-10-CM | POA: Diagnosis not present

## 2020-02-15 DIAGNOSIS — M25551 Pain in right hip: Secondary | ICD-10-CM | POA: Diagnosis not present

## 2020-02-15 MED ORDER — NORTRIPTYLINE HCL 50 MG PO CAPS: 100.0000 mg | ORAL_CAPSULE | Freq: Every day | ORAL | 1 refills | Status: DC

## 2020-02-15 MED ORDER — LISINOPRIL 10 MG PO TABS: 10.0000 mg | ORAL_TABLET | Freq: Every day | ORAL | 1 refills | Status: DC

## 2020-02-15 MED ORDER — HYDROCHLOROTHIAZIDE 12.5 MG PO TABS: 12.5000 mg | ORAL_TABLET | Freq: Every day | ORAL | 1 refills | Status: DC

## 2020-02-15 MED ORDER — LUBIPROSTONE 24 MCG PO CAPS: 24.0000 ug | ORAL_CAPSULE | Freq: Two times a day (BID) | ORAL | 3 refills | Status: DC

## 2020-02-15 NOTE — Telephone Encounter (Signed)
Both are the same- please send script for lubiprostone 24 mcg to the correct place . No need to see me for this .

## 2020-02-15 NOTE — Telephone Encounter (Signed)
Please update pharmacies and send in 90 day supply with 1 refill on the requested meds. Thanks!

## 2020-02-23 ENCOUNTER — Other Ambulatory Visit: Payer: Self-pay

## 2020-02-23 ENCOUNTER — Ambulatory Visit (INDEPENDENT_AMBULATORY_CARE_PROVIDER_SITE_OTHER): Payer: Medicare Other

## 2020-02-23 DIAGNOSIS — E538 Deficiency of other specified B group vitamins: Secondary | ICD-10-CM

## 2020-02-23 MED ORDER — CYANOCOBALAMIN 1000 MCG/ML IJ SOLN
1000.0000 ug | Freq: Once | INTRAMUSCULAR | Status: AC
  Administered 2020-02-23: 1000 ug via INTRAMUSCULAR

## 2020-02-24 DIAGNOSIS — M47817 Spondylosis without myelopathy or radiculopathy, lumbosacral region: Secondary | ICD-10-CM | POA: Diagnosis not present

## 2020-03-02 DIAGNOSIS — R197 Diarrhea, unspecified: Secondary | ICD-10-CM | POA: Diagnosis not present

## 2020-03-02 DIAGNOSIS — Z79899 Other long term (current) drug therapy: Secondary | ICD-10-CM | POA: Diagnosis not present

## 2020-03-02 DIAGNOSIS — R5383 Other fatigue: Secondary | ICD-10-CM | POA: Diagnosis not present

## 2020-03-02 DIAGNOSIS — L4050 Arthropathic psoriasis, unspecified: Secondary | ICD-10-CM | POA: Diagnosis not present

## 2020-03-02 DIAGNOSIS — R7982 Elevated C-reactive protein (CRP): Secondary | ICD-10-CM | POA: Diagnosis not present

## 2020-03-02 DIAGNOSIS — E790 Hyperuricemia without signs of inflammatory arthritis and tophaceous disease: Secondary | ICD-10-CM | POA: Diagnosis not present

## 2020-03-07 ENCOUNTER — Emergency Department
Admission: EM | Admit: 2020-03-07 | Discharge: 2020-03-07 | Disposition: A | Discharge status: Home / Self Care | Payer: Medicare Other | Attending: Emergency Medicine | Admitting: Emergency Medicine

## 2020-03-07 ENCOUNTER — Emergency Department: Payer: Medicare Other

## 2020-03-07 ENCOUNTER — Other Ambulatory Visit: Payer: Self-pay

## 2020-03-07 ENCOUNTER — Ambulatory Visit: Payer: Self-pay | Admitting: *Deleted

## 2020-03-07 ENCOUNTER — Telehealth: Payer: Self-pay

## 2020-03-07 DIAGNOSIS — R079 Chest pain, unspecified: Secondary | ICD-10-CM | POA: Insufficient documentation

## 2020-03-07 DIAGNOSIS — Z5321 Procedure and treatment not carried out due to patient leaving prior to being seen by health care provider: Secondary | ICD-10-CM | POA: Insufficient documentation

## 2020-03-07 LAB — CBC
HCT: 38.7 % (ref 36.0–46.0)
Hemoglobin: 12.6 g/dL (ref 12.0–15.0)
MCH: 29.2 pg (ref 26.0–34.0)
MCHC: 32.6 g/dL (ref 30.0–36.0)
MCV: 89.8 fL (ref 80.0–100.0)
Platelets: 280 10*3/uL (ref 150–400)
RBC: 4.31 MIL/uL (ref 3.87–5.11)
RDW: 15.9 % — ABNORMAL HIGH (ref 11.5–15.5)
WBC: 5.6 10*3/uL (ref 4.0–10.5)
nRBC: 0 % (ref 0.0–0.2)

## 2020-03-07 LAB — BASIC METABOLIC PANEL
Anion gap: 12 (ref 5–15)
BUN: 17 mg/dL (ref 8–23)
CO2: 26 mmol/L (ref 22–32)
Calcium: 9 mg/dL (ref 8.9–10.3)
Chloride: 95 mmol/L — ABNORMAL LOW (ref 98–111)
Creatinine, Ser: 0.87 mg/dL (ref 0.44–1.00)
GFR calc Af Amer: >60 mL/min (ref >60)
GFR calc non Af Amer: >60 mL/min (ref >60)
Glucose, Bld: 115 mg/dL — ABNORMAL HIGH (ref 70–99)
Potassium: 3.6 mmol/L (ref 3.5–5.1)
Sodium: 133 mmol/L — ABNORMAL LOW (ref 135–145)

## 2020-03-07 LAB — TROPONIN I (HIGH SENSITIVITY): Troponin I (High Sensitivity): 3 ng/L (ref <18)

## 2020-03-07 MED ORDER — SODIUM CHLORIDE 0.9% FLUSH: 3.0000 mL | Freq: Once | INTRAVENOUS | Status: DC

## 2020-03-07 NOTE — ED Triage Notes (Signed)
Pain under right breast.

## 2020-03-07 NOTE — ED Notes (Signed)
Pt seen walking out door and getting into car.  Ambulatory without assistance.

## 2020-03-07 NOTE — Telephone Encounter (Signed)
Pt called with complaints of right abdominal pain that started 03/02/20; she states her pain at 8 out of 10; the pain radiates in her right breast, and back; the pt says that the pain is worse with movement, and is similar to the pain when she had pancreatitis; recommendations made per nurse triage protocol; she verbalized understanding; the pt sees Dr Brita Romp, [facility]; will route to office for notification.  Reason for Disposition . [1] SEVERE pain (e.g., excruciating) AND [2] present > 1 hour  Answer Assessment - Initial Assessment Questions 1. LOCATION: "Where does it hurt?"      "Underneath right breast, predominately on right side" 2. RADIATION: "Does the pain shoot anywhere else?" (e.g., chest, back)     Yes, in back and chest 3. ONSET: "When did the pain begin?" (e.g., minutes, hours or days ago)      03/02/20 4. SUDDEN: "Gradual or sudden onset?"     gradual 5. PATTERN "Does the pain come and go, or is it constant?"    - If constant: "Is it getting better, staying the same, or worsening?"      (Note: Constant means the pain never goes away completely; most serious pain is constant and it progresses)     - If intermittent: "How long does it last?" "Do you have pain now?"     (Note: Intermittent means the pain goes away completely between bouts)     Constant; worse with movement 6. SEVERITY: "How bad is the pain?"  (e.g., Scale 1-10; mild, moderate, or severe)    - MILD (1-3): doesn't interfere with normal activities, abdomen soft and not tender to touch     - MODERATE (4-7): interferes with normal activities or awakens from sleep, tender to touch     - SEVERE (8-10): excruciating pain, doubled over, unable to do any normal activities      8 out of 10 7. RECURRENT SYMPTOM: "Have you ever had this type of abdominal pain before?" If so, ask: "When was the last time?" and "What happened that time?"     Yes, 6 years ago with pancreatitis 8. AGGRAVATING FACTORS: "Does anything  seem to cause this pain?" (e.g., foods, stress, alcohol)     Movement makes worse 9. CARDIAC SYMPTOMS: "Do you have any of the following symptoms: chest pain, difficulty breathing, sweating, nausea?"    chest pain, back pain, nausea 10. OTHER SYMPTOMS: "Do you have any other symptoms?" (e.g., fever, vomiting, diarrhea)       Diarrhea, nausea 11. PREGNANCY: "Is there any chance you are pregnant?" "When was your last menstrual period?"      no  Protocols used: ABDOMINAL PAIN - UPPER-A-AH

## 2020-03-07 NOTE — ED Triage Notes (Signed)
Pt c/o right sided chest pain/pressure since Wednesday. Denies N/V/diaphoresis or SOB. Pt is in NAD.

## 2020-03-07 NOTE — Telephone Encounter (Signed)
Copied from [place] [id number]. Topic: General - Other >> Mar 07, 2020 12:01 PM Leward Quan A wrote: Reason for CRM: Patient husband Collier Salina called to inform Dr B that his wife is at The ED and that she was informed that it may be 3 to 4 hrs before she is seen by a doctor. Mr Gerbitz was informed that Dr B would get the message but there was nothing she can do to hurry the process along. Please advise Collier Salina can be reached at Ph# [phone]

## 2020-03-07 NOTE — Telephone Encounter (Signed)
Husband checked status of a call back from PCP Call back [phone]

## 2020-03-07 NOTE — Telephone Encounter (Signed)
Patient reports that she left the ER, due to having to wait.

## 2020-03-07 NOTE — Telephone Encounter (Signed)
Patient reports that she did go to ER.

## 2020-03-08 ENCOUNTER — Telehealth: Payer: Self-pay | Admitting: Emergency Medicine

## 2020-03-08 NOTE — Telephone Encounter (Signed)
Called patient due to lwot to inquire about condition and follow up plans. She says she has informed her doctors and they are taking care of her.

## 2020-03-16 DIAGNOSIS — Z79899 Other long term (current) drug therapy: Secondary | ICD-10-CM | POA: Diagnosis not present

## 2020-03-16 DIAGNOSIS — M25541 Pain in joints of right hand: Secondary | ICD-10-CM | POA: Diagnosis not present

## 2020-03-16 DIAGNOSIS — R7982 Elevated C-reactive protein (CRP): Secondary | ICD-10-CM | POA: Diagnosis not present

## 2020-03-16 DIAGNOSIS — M25559 Pain in unspecified hip: Secondary | ICD-10-CM | POA: Diagnosis not present

## 2020-03-16 DIAGNOSIS — M25532 Pain in left wrist: Secondary | ICD-10-CM | POA: Diagnosis not present

## 2020-03-16 DIAGNOSIS — M25531 Pain in right wrist: Secondary | ICD-10-CM | POA: Diagnosis not present

## 2020-03-16 DIAGNOSIS — M79672 Pain in left foot: Secondary | ICD-10-CM | POA: Diagnosis not present

## 2020-03-16 DIAGNOSIS — M25542 Pain in joints of left hand: Secondary | ICD-10-CM | POA: Diagnosis not present

## 2020-03-16 DIAGNOSIS — M79671 Pain in right foot: Secondary | ICD-10-CM | POA: Diagnosis not present

## 2020-03-26 ENCOUNTER — Encounter: Payer: Self-pay | Admitting: Family Medicine

## 2020-03-28 ENCOUNTER — Ambulatory Visit: Payer: Medicare Other

## 2020-03-29 ENCOUNTER — Other Ambulatory Visit: Payer: Self-pay

## 2020-03-29 ENCOUNTER — Ambulatory Visit (INDEPENDENT_AMBULATORY_CARE_PROVIDER_SITE_OTHER): Payer: Medicare Other

## 2020-03-29 DIAGNOSIS — E538 Deficiency of other specified B group vitamins: Secondary | ICD-10-CM | POA: Diagnosis not present

## 2020-03-29 MED ORDER — CYANOCOBALAMIN 1000 MCG/ML IJ SOLN
1000.0000 ug | Freq: Once | INTRAMUSCULAR | Status: AC
  Administered 2020-03-29: 1000 ug via INTRAMUSCULAR

## 2020-04-04 ENCOUNTER — Encounter: Payer: Self-pay | Admitting: Family Medicine

## 2020-04-06 MED ORDER — ESTRADIOL 2 MG PO TABS: 2.0000 mg | ORAL_TABLET | Freq: Every day | ORAL | 3 refills | Status: DC

## 2020-04-13 DIAGNOSIS — R7982 Elevated C-reactive protein (CRP): Secondary | ICD-10-CM | POA: Diagnosis not present

## 2020-04-13 DIAGNOSIS — M25532 Pain in left wrist: Secondary | ICD-10-CM | POA: Diagnosis not present

## 2020-04-13 DIAGNOSIS — M25542 Pain in joints of left hand: Secondary | ICD-10-CM | POA: Diagnosis not present

## 2020-04-13 DIAGNOSIS — M79672 Pain in left foot: Secondary | ICD-10-CM | POA: Diagnosis not present

## 2020-04-13 DIAGNOSIS — M79671 Pain in right foot: Secondary | ICD-10-CM | POA: Diagnosis not present

## 2020-04-13 DIAGNOSIS — M25559 Pain in unspecified hip: Secondary | ICD-10-CM | POA: Diagnosis not present

## 2020-04-13 DIAGNOSIS — M25541 Pain in joints of right hand: Secondary | ICD-10-CM | POA: Diagnosis not present

## 2020-04-13 DIAGNOSIS — M25531 Pain in right wrist: Secondary | ICD-10-CM | POA: Diagnosis not present

## 2020-04-13 DIAGNOSIS — Z79899 Other long term (current) drug therapy: Secondary | ICD-10-CM | POA: Diagnosis not present

## 2020-04-13 DIAGNOSIS — Z5181 Encounter for therapeutic drug level monitoring: Secondary | ICD-10-CM | POA: Diagnosis not present

## 2020-04-26 ENCOUNTER — Other Ambulatory Visit: Payer: Self-pay

## 2020-04-26 ENCOUNTER — Ambulatory Visit (INDEPENDENT_AMBULATORY_CARE_PROVIDER_SITE_OTHER): Payer: Medicare Other

## 2020-04-26 DIAGNOSIS — E538 Deficiency of other specified B group vitamins: Secondary | ICD-10-CM

## 2020-04-26 MED ORDER — CYANOCOBALAMIN 1000 MCG/ML IJ SOLN
1000.0000 ug | Freq: Once | INTRAMUSCULAR | Status: AC
  Administered 2020-04-26: 1000 ug via INTRAMUSCULAR

## 2020-05-04 DIAGNOSIS — Z79899 Other long term (current) drug therapy: Secondary | ICD-10-CM | POA: Diagnosis not present

## 2020-05-04 DIAGNOSIS — M19071 Primary osteoarthritis, right ankle and foot: Secondary | ICD-10-CM | POA: Diagnosis not present

## 2020-05-04 DIAGNOSIS — Z117 Encounter for testing for latent tuberculosis infection: Secondary | ICD-10-CM | POA: Diagnosis not present

## 2020-05-04 DIAGNOSIS — M79671 Pain in right foot: Secondary | ICD-10-CM | POA: Diagnosis not present

## 2020-05-04 DIAGNOSIS — L4050 Arthropathic psoriasis, unspecified: Secondary | ICD-10-CM | POA: Diagnosis not present

## 2020-05-04 DIAGNOSIS — R7982 Elevated C-reactive protein (CRP): Secondary | ICD-10-CM | POA: Diagnosis not present

## 2020-05-04 DIAGNOSIS — R5383 Other fatigue: Secondary | ICD-10-CM | POA: Diagnosis not present

## 2020-05-04 DIAGNOSIS — Z1159 Encounter for screening for other viral diseases: Secondary | ICD-10-CM | POA: Diagnosis not present

## 2020-05-04 DIAGNOSIS — R197 Diarrhea, unspecified: Secondary | ICD-10-CM | POA: Diagnosis not present

## 2020-05-04 DIAGNOSIS — E790 Hyperuricemia without signs of inflammatory arthritis and tophaceous disease: Secondary | ICD-10-CM | POA: Diagnosis not present

## 2020-05-10 NOTE — Progress Notes (Signed)
Subjective:   Shannon Grimes is a 69 y.o. female who presents for Medicare Annual (Subsequent) preventive examination.  I connected with Shannon Grimes today by telephone and verified that I am speaking with the correct person using two identifiers. Location patient: home Location provider: work Persons participating in the virtual visit: patient, provider.   I discussed the limitations, risks, security and privacy concerns of performing an evaluation and management service by telephone and the availability of in person appointments. I also discussed with the patient that there may be a patient responsible charge related to this service. The patient expressed understanding and verbally consented to this telephonic visit.    Interactive audio and video telecommunications were attempted between this provider and patient, however failed, due to patient having technical difficulties OR patient did not have access to video capability.  We continued and completed visit with audio only.  Review of Systems:  N/A  Cardiac Risk Factors include: advanced age (>23men, >14 women);dyslipidemia;hypertension     Objective:     Vitals: There were no vitals taken for this visit.  There is no height or weight on file to calculate BMI.  Advanced Directives 05/11/2020 03/07/2020 04/28/2019 09/15/2018 01/17/2018 10/16/2017  Does Patient Have a Medical Advance Directive? Yes No Yes Yes Yes Yes  Type of Paramedic of [place];Living will - [place];Living will [place];Living will Living will;Healthcare Power of [place];Living will  Does patient want to make changes to medical advance directive? - - - No - Patient declined - -  Copy of [place] in Chart? Yes - validated most recent copy scanned in chart (See row information) - Yes - validated most recent copy scanned in chart (See row information) No  - copy requested No - copy requested -  Would patient like information on creating a medical advance directive? - No - Patient declined - - - -    Tobacco Social History   Tobacco Use  Smoking Status Former Smoker  . Years: 10.00  . Types: Cigarettes  . Quit date: 01/23/1983  . Years since quitting: 37.3  Smokeless Tobacco Never Used  Tobacco Comment   was social smoker, not everyday     Counseling given: Not Answered Comment: was social smoker, not everyday   Clinical Intake:  Pre-visit preparation completed: Yes  Pain : 0-10 Pain Score: 6  Pain Type: Chronic pain Pain Location: Hip Pain Orientation: Right Pain Descriptors / Indicators: Aching Pain Frequency: Constant Pain Relieving Factors: Currently icing hip and taking prescription pain medication.  Pain Relieving Factors: Currently icing hip and taking prescription pain medication.  Nutritional Risks: Nausea/ vomitting/ diarrhea(Has nausea and diarrhea occasionally due to MTX.) Diabetes: No(Prediabetic)  How often do you need to have someone help you when you read instructions, pamphlets, or other written materials from your doctor or pharmacy?: 1 - Never  Interpreter Needed?: No  Information entered by :: [facility], LPN  Past Medical History:  Diagnosis Date  . Allergy   . Anemia    pernicious  . Avascular necrosis (HCC)    right hip  . Blood transfusion without reported diagnosis    after hysterectomy  . Carpal tunnel syndrome on right   . Cubital tunnel syndrome on right   . GERD (gastroesophageal reflux disease)   . Hyperlipidemia   . Hypertension   . Migraine headache    3-4x/month  . Neuromuscular disorder ([place])    multiple ortho  issues with abnormal EMGs  . Osteoarthritis   . PONV (postoperative nausea and vomiting)    also triggers migraines   Past Surgical History:  Procedure Laterality Date  . ANKLE FRACTURE SURGERY  1994   screws removed in 2007  . ANKLE FUSION Right 2006  .  APPENDECTOMY  1984  . CATARACT EXTRACTION W/PHACO Right 09/15/2018   Procedure: CATARACT EXTRACTION PHACO AND INTRAOCULAR LENS PLACEMENT ([place]) RIGHT;  Surgeon: Eulogio Bear, MD;  Location: [facility];  Service: Ophthalmology;  Laterality: Right;  . CHOLECYSTECTOMY  1994  . CLOSED REDUCTION HIP DISLOCATION    . Hip abductor attachment Right 2013  . HIP FRACTURE SURGERY  2001  . HIP SURGERY Right    Revision  . ILIOTIBIAL BAND RELEASE  2016  . LUMBAR FUSION  1999   L5-S1 fusion  . LUMBAR FUSION  2004   L3-L4 fusion  . LUMBAR FUSION  2016   L2-L5, needed revision of previous surgeries  . SACROILIAC JOINT FUSION Right 2012  . STEROID INJECTION TO SCAR    . TOTAL ABDOMINAL HYSTERECTOMY W/ BILATERAL SALPINGOOPHORECTOMY  1984   endometriosis  . TOTAL HIP ARTHROPLASTY Right 2009   Family History  Problem Relation Age of Onset  . Stroke Mother 70  . Congestive Heart Failure Mother   . Heart disease Father 67  . Colon cancer Sister 28  . Kidney failure Sister        chronic  . Liver disease Sister        end stage  . Colon cancer Brother 86  . Alcoholism Brother        recovering  . Mitral valve prolapse Sister   . Breast cancer Neg Hx    Social History   Socioeconomic History  . Marital status: Married    Spouse name: Collier Salina  . Number of children: 2  . Years of education: bachelor's  . Highest education level: Bachelor's degree (e.g., BA, AB, BS)  Occupational History  . Occupation: Retired    Comment: Education officer, museum  Tobacco Use  . Smoking status: Former Smoker    Years: 10.00    Types: Cigarettes    Quit date: 01/23/1983    Years since quitting: 37.3  . Smokeless tobacco: Never Used  . Tobacco comment: was social smoker, not everyday  Substance and Sexual Activity  . Alcohol use: No  . Drug use: No  . Sexual activity: Yes    Birth control/protection: Surgical  Other Topics Concern  . Not on file  Social History Narrative  . Not on file   Social  Determinants of Health   Financial Resource Strain: Low Risk   . Difficulty of Paying Living Expenses: Not hard at all  Food Insecurity: No Food Insecurity  . Worried About Charity fundraiser in the Last Year: Never true  . Ran Out of Food in the Last Year: Never true  Transportation Needs: No Transportation Needs  . Lack of Transportation (Medical): No  . Lack of Transportation (Non-Medical): No  Physical Activity: Inactive  . Days of Exercise per Week: 0 days  . Minutes of Exercise per Session: 0 min  Stress: No Stress Concern Present  . Feeling of Stress : Not at all  Social Connections: Somewhat Isolated  . Frequency of Communication with Friends and Family: More than three times a week  . Frequency of Social Gatherings with Friends and Family: More than three times a week  . Attends Religious Services: Never  .  Active Member of Clubs or Organizations: No  . Attends Archivist Meetings: Never  . Marital Status: Married    Outpatient Encounter Medications as of 05/11/2020  Medication Sig  . celecoxib (CELEBREX) 200 MG capsule Take 200 mg by mouth daily as needed.  . cyanocobalamin (,VITAMIN B-12,) 1000 MCG/ML injection INJECT 1 ML AS DIRECTED EVERY 30 (THIRTY) DAYS.  Marland Kitchen estradiol (ESTRACE) 2 MG tablet Take 1 tablet (2 mg total) by mouth daily.  . folic acid (FOLVITE) 1 MG tablet folic acid 1 mg tablet  TK 1 T PO QD  . gabapentin (NEURONTIN) 400 MG capsule TAKE 1 CAPSULE BY MOUTH 3  TIMES DAILY (Patient taking differently: Occasionally takes 2 tablets a day instead of 3)  . hydrochlorothiazide (HYDRODIURIL) 12.5 MG tablet Take 1 tablet (12.5 mg total) by mouth daily.  Marland Kitchen HYDROcodone Bitartrate ER (HYSINGLA ER) 20 MG T24A Take 1 tablet by mouth daily.   Marland Kitchen ibuprofen (ADVIL) 200 MG tablet Take 200 mg by mouth every 6 (six) hours as needed.  Marland Kitchen lisinopril (ZESTRIL) 10 MG tablet Take 1 tablet (10 mg total) by mouth daily.  Marland Kitchen lubiprostone (AMITIZA) 24 MCG capsule Take 1 capsule  (24 mcg total) by mouth 2 (two) times daily with a meal.  . methotrexate (RHEUMATREX) 2.5 MG tablet 2.5 mg. 8 tablets weekly  . nortriptyline (PAMELOR) 50 MG capsule Take 2 capsules (100 mg total) by mouth at bedtime. (Patient taking differently: Take 100 mg by mouth at bedtime. Takes 1-2 tablets at a time)  . oxyCODONE-acetaminophen (PERCOCET) 10-325 MG tablet Take 1 tablet by mouth every 6 (six) hours as needed.  . promethazine (PHENERGAN) 25 MG tablet Take 1 tablet (25 mg total) by mouth every 4 (four) hours as needed.  . SUMAtriptan (IMITREX) 100 MG tablet TAKE 1 TABLET BY MOUTH  DAILY AS NEEDED  . tiZANidine (ZANAFLEX) 4 MG tablet Take 1 tablet (4 mg total) by mouth 2 (two) times daily. (Patient taking differently: Take 4 mg by mouth 2 (two) times daily. Taking 1-2 times daily.)  . dicyclomine (BENTYL) 10 MG capsule Take 1 capsule (10 mg total) by mouth 4 (four) times daily -  before meals and at bedtime. (Patient not taking: Reported on 05/11/2020)  . OXYCONTIN 20 MG 12 hr tablet Take 20 mg by mouth every 12 (twelve) hours.  . triamcinolone ointment (KENALOG) 0.5 % Apply 1 application topically 2 (two) times daily. (Patient not taking: Reported on 05/11/2020)   No facility-administered encounter medications on file as of 05/11/2020.    Activities of Daily Living In your present state of health, do you have any difficulty performing the following activities: 05/11/2020  Hearing? N  Vision? N  Difficulty concentrating or making decisions? N  Walking or climbing stairs? N  Dressing or bathing? N  Doing errands, shopping? N  Preparing Food and eating ? N  Using the Toilet? N  In the past six months, have you accidently leaked urine? N  Do you have problems with loss of bowel control? N  Managing your Medications? N  Managing your Finances? N  Housekeeping or managing your Housekeeping? N  Some recent data might be hidden    Patient Care Team: Virginia Crews, MD as PCP - General  (Family Medicine) Nadene Rubins, DO as Referring Physician (Physical Medicine and Rehabilitation) Jonathon Bellows, MD as Consulting Physician (Gastroenterology) Berline Chough, MD as Referring Physician (Rheumatology) Rutherford, Alfonso Patten., MD as Referring Physician (Orthopedic Surgery) Eulogio Bear,  MD as Consulting Physician (Ophthalmology)    Assessment:   This is a routine wellness examination for [facility].  Exercise Activities and Dietary recommendations Current Exercise Habits: The patient does not participate in regular exercise at present, Exercise limited by: orthopedic condition(s)  Goals    . DIET - REDUCE SUGAR INTAKE     Recommend cutting back on desserts and sugars in daily diet. Pt is cutting back to eating 3 sweets a week.     Marland Kitchen LIFESTYLE - DECREASE FALLS RISK     Recommend to remove any items from the home that may cause slips or trips.       Fall Risk: Fall Risk  05/11/2020 04/28/2019 01/17/2018 10/16/2017  Falls in the past year? 1 0 No Yes  Number falls in past yr: 0 - - 1  Injury with Fall? 0 - - No  Risk for fall due to : Impaired balance/gait - - -  Follow up Falls prevention discussed - - Falls evaluation completed    FALL RISK PREVENTION PERTAINING TO THE HOME:  Any stairs in or around the home? No  If so, are there any without handrails? N/A  Home free of loose throw rugs in walkways, pet beds, electrical cords, etc? Yes  Adequate lighting in your home to reduce risk of falls? Yes   ASSISTIVE DEVICES UTILIZED TO PREVENT FALLS:  Life alert? No  Use of a cane, walker or w/c? Yes  Grab bars in the bathroom? Yes  Shower chair or bench in shower? Yes  Elevated toilet seat or a handicapped toilet? Yes    TIMED UP AND GO:  Was the test performed? No .    Depression Screen PHQ 2/9 Scores 05/11/2020 04/28/2019 01/17/2018 10/16/2017  PHQ - 2 Score 0 0 0 0     Cognitive Function: Declined today.        Immunization History    Administered Date(s) Administered  . Fluad Quad(high Dose 65+) 10/21/2019  . Influenza, High Dose Seasonal PF 10/24/2017, 10/28/2018  . Pneumococcal Conjugate-13 07/06/2016  . Pneumococcal Polysaccharide-23 11/19/2017  . Unspecified SARS-COV-2 Vaccination 03/27/2020, 04/22/2020  . Zoster 11/21/2011    Qualifies for Shingles Vaccine? Yes  Zostavax completed 01/20/11. Due for Shingrix. Pt has been advised to call insurance company to determine out of pocket expense. Advised may also receive vaccine at local pharmacy or Health Dept. Verbalized acceptance and understanding.  Tdap: Although this vaccine is not a covered service during a Wellness Exam, does the patient still wish to receive this vaccine today?  No . Advised may receive this vaccine at local pharmacy or Health Dept. Aware to provide a copy of the vaccination record if obtained from local pharmacy or Health Dept. Verbalized acceptance and understanding.  Flu Vaccine: Up to date  Pneumococcal Vaccine: Completed series  Screening Tests Health Maintenance  Topic Date Due  . TETANUS/TDAP  05/10/2024 (Originally 03/24/1970)  . INFLUENZA VACCINE  07/24/2020  . COLONOSCOPY  09/27/2020  . DEXA SCAN  07/10/2021  . MAMMOGRAM  10/07/2021  . COVID-19 Vaccine  Completed  . Hepatitis C Screening  Completed  . PNA vac Low Risk Adult  Completed    Cancer Screenings:  Colorectal Screening: Completed 09/28/15. Repeat every 5 years.   Mammogram: Completed 10/08/19. Repeat every 1-2 years as advised.   Bone Density: Completed 07/10/16. Results reflect OSTEOPENIA. Repeat every 5 years.   Lung Cancer Screening: (Low Dose CT Chest recommended if Age 31-80 years, 30 pack-year currently smoking OR have quit  w/in 15years.) does not qualify.   Additional Screening:  Hepatitis C Screening: Up to date  Vision Screening: Recommended annual ophthalmology exams for early detection of glaucoma and other disorders of the eye.  Dental Screening:  Recommended annual dental exams for proper oral hygiene  Community Resource Referral:  CRR required this visit? No     Plan:  I have personally reviewed and addressed the Medicare Annual Wellness questionnaire and have noted the following in the patient's chart:  A. Medical and social history B. Use of alcohol, tobacco or illicit drugs  C. Current medications and supplements D. Functional ability and status E.  Nutritional status F.  Physical activity G. Advance directives H. List of other physicians I.  Hospitalizations, surgeries, and ER visits in previous 12 months J.  [place] such as hearing and vision if needed, cognitive and depression L. Referrals and appointments   In addition, I have reviewed and discussed with patient certain preventive protocols, quality metrics, and best practice recommendations. A written personalized care plan for preventive services as well as general preventive health recommendations were provided to patient. Nurse Health Advisor  Signed,    Hyatt Capobianco [place], [place]  [id number] Nurse Health Advisor   Nurse Notes: None.

## 2020-05-11 ENCOUNTER — Ambulatory Visit (INDEPENDENT_AMBULATORY_CARE_PROVIDER_SITE_OTHER): Payer: Medicare Other

## 2020-05-11 ENCOUNTER — Other Ambulatory Visit: Payer: Self-pay

## 2020-05-11 DIAGNOSIS — Z0000 Encounter for general adult medical examination without abnormal findings: Secondary | ICD-10-CM | POA: Diagnosis not present

## 2020-05-11 NOTE — Patient Instructions (Signed)
Shannon Grimes , Thank you for taking time to come for your Medicare Wellness Visit. I appreciate your ongoing commitment to your health goals. Please review the following plan we discussed and let me know if I can assist you in the future.   Screening recommendations/referrals: Colonoscopy: Up to date, due 09/2020 Mammogram: Up to date, due 09/2021 Bone Density: Up to date, due 06/2021 Recommended yearly ophthalmology/optometry visit for glaucoma screening and checkup Recommended yearly dental visit for hygiene and checkup  Vaccinations: Influenza vaccine: Up to date Pneumococcal vaccine: Completed series Tdap vaccine: Pt declines today.  Shingles vaccine: Pt declines today.     Advanced directives: Currently on file.  Conditions/risks identified: Fall risk preventatives discussed today.   Next appointment: 05/12/20 @ 10:20 AM with Dr Brita Romp. Declined scheduling an AWV for 2022 at this time.   Preventive Care 31 Years and Older, Female Preventive care refers to lifestyle choices and visits with your health care provider that can promote health and wellness. What does preventive care include?  A yearly physical exam. This is also called an annual well check.  Dental exams once or twice a year.  Routine eye exams. Ask your health care provider how often you should have your eyes checked.  Personal lifestyle choices, including:  Daily care of your teeth and gums.  Regular physical activity.  Eating a healthy diet.  Avoiding tobacco and drug use.  Limiting alcohol use.  Practicing safe sex.  Taking low-dose aspirin every day.  Taking vitamin and mineral supplements as recommended by your health care provider. What happens during an annual well check? The services and screenings done by your health care provider during your annual well check will depend on your age, overall health, lifestyle risk factors, and family history of disease. Counseling  Your health care  provider may ask you questions about your:  Alcohol use.  Tobacco use.  Drug use.  Emotional well-being.  Home and relationship well-being.  Sexual activity.  Eating habits.  History of falls.  Memory and ability to understand (cognition).  Work and work Statistician.  Reproductive health. Screening  You may have the following tests or measurements:  Height, weight, and BMI.  Blood pressure.  Lipid and cholesterol levels. These may be checked every 5 years, or more frequently if you are over 65 years old.  Skin check.  Lung cancer screening. You may have this screening every year starting at age 55 if you have a 30-pack-year history of smoking and currently smoke or have quit within the past 15 years.  Fecal occult blood test (FOBT) of the stool. You may have this test every year starting at age 54.  Flexible sigmoidoscopy or colonoscopy. You may have a sigmoidoscopy every 5 years or a colonoscopy every 10 years starting at age 72.  Hepatitis C blood test.  Hepatitis B blood test.  Sexually transmitted disease (STD) testing.  Diabetes screening. This is done by checking your blood sugar (glucose) after you have not eaten for a while (fasting). You may have this done every 1-3 years.  Bone density scan. This is done to screen for osteoporosis. You may have this done starting at age 48.  Mammogram. This may be done every 1-2 years. Talk to your health care provider about how often you should have regular mammograms. Talk with your health care provider about your test results, treatment options, and if necessary, the need for more tests. Vaccines  Your health care provider may recommend certain vaccines, such as:  Influenza vaccine. This is recommended every year.  Tetanus, diphtheria, and acellular pertussis (Tdap, Td) vaccine. You may need a Td booster every 10 years.  Zoster vaccine. You may need this after age 44.  Pneumococcal 13-valent conjugate (PCV13)  vaccine. One dose is recommended after age 64.  Pneumococcal polysaccharide (PPSV23) vaccine. One dose is recommended after age 59. Talk to your health care provider about which screenings and vaccines you need and how often you need them. This information is not intended to replace advice given to you by your health care provider. Make sure you discuss any questions you have with your health care provider. Document Released: 01/06/2016 Document Revised: 08/29/2016 Document Reviewed: 10/11/2015 Elsevier Interactive Patient Education  2017 [employer] Prevention in the Home Falls can cause injuries. They can happen to people of all ages. There are many things you can do to make your home safe and to help prevent falls. What can I do on the outside of my home?  Regularly fix the edges of walkways and driveways and fix any cracks.  Remove anything that might make you trip as you walk through a door, such as a raised step or threshold.  Trim any bushes or trees on the path to your home.  Use bright outdoor lighting.  Clear any walking paths of anything that might make someone trip, such as rocks or tools.  Regularly check to see if handrails are loose or broken. Make sure that both sides of any steps have handrails.  Any raised decks and porches should have guardrails on the edges.  Have any leaves, snow, or ice cleared regularly.  Use sand or salt on walking paths during winter.  Clean up any spills in your garage right away. This includes oil or grease spills. What can I do in the bathroom?  Use night lights.  Install grab bars by the toilet and in the tub and shower. Do not use towel bars as grab bars.  Use non-skid mats or decals in the tub or shower.  If you need to sit down in the shower, use a plastic, non-slip stool.  Keep the floor dry. Clean up any water that spills on the floor as soon as it happens.  Remove soap buildup in the tub or shower  regularly.  Attach bath mats securely with double-sided non-slip rug tape.  Do not have throw rugs and other things on the floor that can make you trip. What can I do in the bedroom?  Use night lights.  Make sure that you have a light by your bed that is easy to reach.  Do not use any sheets or blankets that are too big for your bed. They should not hang down onto the floor.  Have a firm chair that has side arms. You can use this for support while you get dressed.  Do not have throw rugs and other things on the floor that can make you trip. What can I do in the kitchen?  Clean up any spills right away.  Avoid walking on wet floors.  Keep items that you use a lot in easy-to-reach places.  If you need to reach something above you, use a strong step stool that has a grab bar.  Keep electrical cords out of the way.  Do not use floor polish or wax that makes floors slippery. If you must use wax, use non-skid floor wax.  Do not have throw rugs and other things on the floor that  can make you trip. What can I do with my stairs?  Do not leave any items on the stairs.  Make sure that there are handrails on both sides of the stairs and use them. Fix handrails that are broken or loose. Make sure that handrails are as long as the stairways.  Check any carpeting to make sure that it is firmly attached to the stairs. Fix any carpet that is loose or worn.  Avoid having throw rugs at the top or bottom of the stairs. If you do have throw rugs, attach them to the floor with carpet tape.  Make sure that you have a light switch at the top of the stairs and the bottom of the stairs. If you do not have them, ask someone to add them for you. What else can I do to help prevent falls?  Wear shoes that:  Do not have high heels.  Have rubber bottoms.  Are comfortable and fit you well.  Are closed at the toe. Do not wear sandals.  If you use a stepladder:  Make sure that it is fully  opened. Do not climb a closed stepladder.  Make sure that both sides of the stepladder are locked into place.  Ask someone to hold it for you, if possible.  Clearly mark and make sure that you can see:  Any grab bars or handrails.  First and last steps.  Where the edge of each step is.  Use tools that help you move around (mobility aids) if they are needed. These include:  Canes.  Walkers.  Scooters.  Crutches.  Turn on the lights when you go into a dark area. Replace any light bulbs as soon as they burn out.  Set up your furniture so you have a clear path. Avoid moving your furniture around.  If any of your floors are uneven, fix them.  If there are any pets around you, be aware of where they are.  Review your medicines with your doctor. Some medicines can make you feel dizzy. This can increase your chance of falling. Ask your doctor what other things that you can do to help prevent falls. This information is not intended to replace advice given to you by your health care provider. Make sure you discuss any questions you have with your health care provider. Document Released: 10/06/2009 Document Revised: 05/17/2016 Document Reviewed: 01/14/2015 Elsevier Interactive Patient Education  2017 [employer].

## 2020-05-12 ENCOUNTER — Ambulatory Visit (INDEPENDENT_AMBULATORY_CARE_PROVIDER_SITE_OTHER): Payer: Medicare Other | Admitting: Family Medicine

## 2020-05-12 ENCOUNTER — Other Ambulatory Visit: Payer: Self-pay

## 2020-05-12 ENCOUNTER — Encounter: Payer: Self-pay | Admitting: Family Medicine

## 2020-05-12 VITALS — BP 112/73 | HR 80 | Temp 97.1°F | Resp 16 | Wt 172.0 lb

## 2020-05-12 DIAGNOSIS — E785 Hyperlipidemia, unspecified: Secondary | ICD-10-CM | POA: Diagnosis not present

## 2020-05-12 DIAGNOSIS — I10 Essential (primary) hypertension: Secondary | ICD-10-CM

## 2020-05-12 DIAGNOSIS — E538 Deficiency of other specified B group vitamins: Secondary | ICD-10-CM

## 2020-05-12 DIAGNOSIS — L4050 Arthropathic psoriasis, unspecified: Secondary | ICD-10-CM

## 2020-05-12 DIAGNOSIS — R7303 Prediabetes: Secondary | ICD-10-CM | POA: Diagnosis not present

## 2020-05-12 NOTE — Assessment & Plan Note (Signed)
Well controlled Continue current medications Recheck metabolic panel F/u in 6 months  

## 2020-05-12 NOTE — Assessment & Plan Note (Signed)
Not currently on a statin. Was in clinical study for Lipids. Intolerant to statins due to pancreatitis. Discussed diet and exercise.

## 2020-05-12 NOTE — Progress Notes (Signed)
I,Shannon Grimes,acting as a scribe for Shannon Paganini, MD.,have documented all relevant documentation on the behalf of Shannon Paganini, MD,as directed by  Shannon Paganini, MD while in the presence of Shannon Paganini, MD.  Established patient visit   Patient: Shannon Grimes   DOB: Apr 19, 1951   69 y.o. Female  MRN: [id number] Visit Date: 05/12/2020  Today's healthcare provider: [name], MD   Chief Complaint  Patient presents with  . Hypertension  . Hyperlipidemia  . Vitamin B12 Deficiency   Subjective    HPI  Had AWV with [facility] on 05/11/2020  Follow up for Vitamin B12 Deficiency:  The patient was last seen for this 9 days ago. Changes made at last visit include none; B12 injection administered.  She reports good compliance with treatment. She feels that condition is Unchanged. She is not having side effects.   -----------------------------------------------------------------------------------------  Hypertension, follow-up  BP Readings from Last 3 Encounters:  05/12/20 112/73  03/07/20 (!) 157/68  11/11/19 115/76   Wt Readings from Last 3 Encounters:  05/12/20 172 lb (78 kg)  03/07/20 161 lb (73 kg)  11/11/19 163 lb (73.9 kg)     She was last seen for hypertension 6 months ago.  BP at that visit was 115/76. Management since that visit includes continuing same medications.  She reports good compliance with treatment. She is not having side effects.  She is following a Regular diet. She is not exercising. She does not smoke.  Use of agents associated with hypertension: none.  Outside blood pressures are 120/76-80. Symptoms: No chest pain No chest pressure  No palpitations No syncope  No dyspnea No orthopnea  No paroxysmal nocturnal dyspnea No lower extremity edema   Pertinent labs: Lab Results  Component Value Date   CHOL 146 11/11/2019   HDL 31 (L) 11/11/2019   LDLCALC 84 11/11/2019   TRIG 180 (H) 11/11/2019   CHOLHDL 4.7  (H) 11/11/2019   Lab Results  Component Value Date   NA 133 (L) 03/07/2020   K 3.6 03/07/2020   CREATININE 0.87 03/07/2020   GFRNONAA >60 03/07/2020   GFRAA >60 03/07/2020   GLUCOSE 115 (H) 03/07/2020     The 10-year ASCVD risk score Shannon Bussing DC Jr., et al., 2013) is: 9%   ---------------------------------------------------------------------------------------------------  Lipid/Cholesterol, Follow-up  Last lipid panel Other pertinent labs  Lab Results  Component Value Date   CHOL 146 11/11/2019   HDL 31 (L) 11/11/2019   LDLCALC 84 11/11/2019   TRIG 180 (H) 11/11/2019   CHOLHDL 4.7 (H) 11/11/2019   Lab Results  Component Value Date   ALT 24 10/28/2018   AST 34 10/28/2018   PLT 280 03/07/2020   TSH 1.88 11/19/2017     She was last seen for this 6 months ago.  Management since that visit includes no changes.  She reports good compliance with treatment. She is not having side effects.   Symptoms: No chest pain No chest pressure/discomfort  No dyspnea No lower extremity edema  No numbness or tingling of extremity No orthopnea  No palpitations No paroxysmal nocturnal dyspnea  No speech difficulty No syncope   Current diet: well balanced Current exercise: none  The 10-year ASCVD risk score Shannon Bussing DC Jr., et al., 2013) is: 9%  ---------------------------------------------------------------------------------------------------   Social History   Tobacco Use  . Smoking status: Former Smoker    Years: 10.00    Types: Cigarettes    Quit date: 01/23/1983    Years since quitting:  37.3  . Smokeless tobacco: Never Used  . Tobacco comment: was social smoker, not everyday  Substance Use Topics  . Alcohol use: No  . Drug use: No       Medications: Outpatient Medications Prior to Visit  Medication Sig  . celecoxib (CELEBREX) 200 MG capsule Take 200 mg by mouth daily as needed.  . cyanocobalamin (,VITAMIN B-12,) 1000 MCG/ML injection INJECT 1 ML AS DIRECTED EVERY 30  (THIRTY) DAYS.  Marland Kitchen estradiol (ESTRACE) 2 MG tablet Take 1 tablet (2 mg total) by mouth daily.  . folic acid (FOLVITE) 1 MG tablet folic acid 1 mg tablet  TK 1 T PO QD  . gabapentin (NEURONTIN) 400 MG capsule TAKE 1 CAPSULE BY MOUTH 3  TIMES DAILY (Patient taking differently: Occasionally takes 2 tablets a day instead of 3)  . hydrochlorothiazide (HYDRODIURIL) 12.5 MG tablet Take 1 tablet (12.5 mg total) by mouth daily.  Marland Kitchen HYDROcodone Bitartrate ER (HYSINGLA ER) 20 MG T24A Take 1 tablet by mouth daily.   Marland Kitchen ibuprofen (ADVIL) 200 MG tablet Take 200 mg by mouth every 6 (six) hours as needed.  Marland Kitchen lisinopril (ZESTRIL) 10 MG tablet Take 1 tablet (10 mg total) by mouth daily.  Marland Kitchen lubiprostone (AMITIZA) 24 MCG capsule Take 1 capsule (24 mcg total) by mouth 2 (two) times daily with a meal.  . methotrexate (RHEUMATREX) 2.5 MG tablet 2.5 mg. 8 tablets weekly  . nortriptyline (PAMELOR) 50 MG capsule Take 2 capsules (100 mg total) by mouth at bedtime. (Patient taking differently: Take 100 mg by mouth at bedtime. Takes 1-2 tablets at a time)  . oxyCODONE-acetaminophen (PERCOCET) 10-325 MG tablet Take 1 tablet by mouth every 6 (six) hours as needed.  . OXYCONTIN 20 MG 12 hr tablet Take 20 mg by mouth every 12 (twelve) hours.  . promethazine (PHENERGAN) 25 MG tablet Take 1 tablet (25 mg total) by mouth every 4 (four) hours as needed.  . SUMAtriptan (IMITREX) 100 MG tablet TAKE 1 TABLET BY MOUTH  DAILY AS NEEDED  . tiZANidine (ZANAFLEX) 4 MG tablet Take 1 tablet (4 mg total) by mouth 2 (two) times daily. (Patient taking differently: Take 4 mg by mouth 2 (two) times daily. Taking 1-2 times daily.)  . triamcinolone ointment (KENALOG) 0.5 % Apply 1 application topically 2 (two) times daily.  Marland Kitchen dicyclomine (BENTYL) 10 MG capsule Take 1 capsule (10 mg total) by mouth 4 (four) times daily -  before meals and at bedtime. (Patient not taking: Reported on 05/11/2020)   No facility-administered medications prior to visit.     Review of Systems  Constitutional: Negative for appetite change, chills, fatigue and fever.  Respiratory: Negative for chest tightness and shortness of breath.   Cardiovascular: Negative for chest pain and palpitations.  Gastrointestinal: Negative for abdominal pain, nausea and vomiting.  Neurological: Negative for dizziness and weakness.       Objective    BP 112/73 (BP Location: Left Arm, Patient Position: Sitting, Cuff Size: Large)   Pulse 80   Temp (!) 97.1 F (36.2 C) (Temporal)   Resp 16   Wt 172 lb (78 kg)   BMI 25.40 kg/m    Physical Exam Constitutional:      General: She is not in acute distress.    Appearance: Normal appearance. She is not diaphoretic.  HENT:     Head: Normocephalic and atraumatic.  Eyes:     Conjunctiva/sclera: Conjunctivae normal.  Cardiovascular:     Rate and Rhythm: Normal rate and regular  rhythm.     Pulses: Normal pulses.     Heart sounds: Normal heart sounds. No murmur.  Pulmonary:     Effort: Pulmonary effort is normal. No respiratory distress.     Breath sounds: Normal breath sounds. No wheezing.  Abdominal:     General: There is no distension.     Palpations: Abdomen is soft.     Tenderness: There is no abdominal tenderness.  Musculoskeletal:     Right lower leg: No edema.     Left lower leg: No edema.  Skin:    General: Skin is warm and dry.     Findings: No rash.  Neurological:     Mental Status: She is alert and oriented to person, place, and time. Mental status is at baseline.     Gait: Gait abnormal (antalgic).  Psychiatric:        Mood and Affect: Mood normal.        Behavior: Behavior normal.      No results found for any visits on 05/12/20.  Assessment & Plan     1. Essential hypertension Well controlled Continue current medications Recheck metabolic panel F/u in 6 months - Basic Metabolic Panel (BMET)  2. Hyperlipidemia, unspecified hyperlipidemia type Not currently on a statin. Was in clinical study for  Lipids. Intolerant to statins due to pancreatitis. Discussed diet and exercise.  - Basic Metabolic Panel (BMET)  3. Vitamin B12 deficiency Will recheck B12 levels.  - B12  4. Prediabetes Will recheck labs. Encourage low carb diet  - HgB A1c  5. Psoriatic arthritis ([place]) Managed by Rheumatology.   Return in about 6 months (around 11/12/2020).      I, Shannon Paganini, MD, have reviewed all documentation for this visit. The documentation on 05/12/20 for the exam, diagnosis, procedures, and orders are all accurate and complete.   Bacigalupo, [name], MD, MPH [facility]

## 2020-05-13 ENCOUNTER — Telehealth: Payer: Self-pay

## 2020-05-13 LAB — BASIC METABOLIC PANEL
BUN/Creatinine Ratio: 22 (ref 12–28)
BUN: 19 mg/dL (ref 8–27)
CO2: 23 mmol/L (ref 20–29)
Calcium: 9.2 mg/dL (ref 8.7–10.3)
Chloride: 100 mmol/L (ref 96–106)
Creatinine, Ser: 0.85 mg/dL (ref 0.57–1.00)
GFR calc Af Amer: 81 mL/min/{1.73_m2} (ref >59)
GFR calc non Af Amer: 70 mL/min/{1.73_m2} (ref >59)
Glucose: 97 mg/dL (ref 65–99)
Potassium: 3.9 mmol/L (ref 3.5–5.2)
Sodium: 141 mmol/L (ref 134–144)

## 2020-05-13 LAB — HEMOGLOBIN A1C
Est. average glucose Bld gHb Est-mCnc: 111 mg/dL
Hgb A1c MFr Bld: 5.5 % (ref 4.8–5.6)

## 2020-05-13 LAB — VITAMIN B12: Vitamin B-12: 679 pg/mL (ref 232–1245)

## 2020-05-13 NOTE — Telephone Encounter (Signed)
[employer]   Result Notes and Comments to Patient Comment seen by patient Shannon Grimes on 05/13/2020 8:17 AM EDT

## 2020-05-13 NOTE — Telephone Encounter (Signed)
-----   Message from Virginia Crews, MD sent at 05/13/2020  8:14 AM EDT ----- Normal labs

## 2020-05-18 ENCOUNTER — Encounter: Payer: Self-pay | Admitting: Family Medicine

## 2020-05-24 ENCOUNTER — Other Ambulatory Visit: Payer: Self-pay

## 2020-05-24 ENCOUNTER — Ambulatory Visit (INDEPENDENT_AMBULATORY_CARE_PROVIDER_SITE_OTHER): Payer: Medicare Other

## 2020-05-24 DIAGNOSIS — E538 Deficiency of other specified B group vitamins: Secondary | ICD-10-CM | POA: Diagnosis not present

## 2020-05-24 MED ORDER — CYANOCOBALAMIN 1000 MCG/ML IJ SOLN
1000.0000 ug | Freq: Once | INTRAMUSCULAR | Status: AC
  Administered 2020-05-24: 1000 ug via INTRAMUSCULAR

## 2020-06-21 ENCOUNTER — Ambulatory Visit: Payer: Medicare Other

## 2020-06-22 ENCOUNTER — Other Ambulatory Visit: Payer: Self-pay | Admitting: Family Medicine

## 2020-06-22 MED ORDER — SUMATRIPTAN SUCCINATE 100 MG PO TABS: 100.0000 mg | ORAL_TABLET | Freq: Every day | ORAL | 0 refills | Status: DC | PRN

## 2020-06-22 MED ORDER — GABAPENTIN 400 MG PO CAPS: 400.0000 mg | ORAL_CAPSULE | Freq: Three times a day (TID) | ORAL | 0 refills | Status: DC

## 2020-06-28 ENCOUNTER — Other Ambulatory Visit: Payer: Self-pay

## 2020-06-28 ENCOUNTER — Ambulatory Visit (INDEPENDENT_AMBULATORY_CARE_PROVIDER_SITE_OTHER): Payer: Medicare Other

## 2020-06-28 DIAGNOSIS — E538 Deficiency of other specified B group vitamins: Secondary | ICD-10-CM | POA: Diagnosis not present

## 2020-06-28 MED ORDER — CYANOCOBALAMIN 1000 MCG/ML IJ SOLN
1000.0000 ug | Freq: Once | INTRAMUSCULAR | Status: AC
  Administered 2020-06-28: 1000 ug via INTRAMUSCULAR

## 2020-07-06 DIAGNOSIS — M25541 Pain in joints of right hand: Secondary | ICD-10-CM | POA: Diagnosis not present

## 2020-07-06 DIAGNOSIS — M79671 Pain in right foot: Secondary | ICD-10-CM | POA: Diagnosis not present

## 2020-07-06 DIAGNOSIS — M25532 Pain in left wrist: Secondary | ICD-10-CM | POA: Diagnosis not present

## 2020-07-06 DIAGNOSIS — M25531 Pain in right wrist: Secondary | ICD-10-CM | POA: Diagnosis not present

## 2020-07-06 DIAGNOSIS — M25559 Pain in unspecified hip: Secondary | ICD-10-CM | POA: Diagnosis not present

## 2020-07-06 DIAGNOSIS — R7982 Elevated C-reactive protein (CRP): Secondary | ICD-10-CM | POA: Diagnosis not present

## 2020-07-06 DIAGNOSIS — M79672 Pain in left foot: Secondary | ICD-10-CM | POA: Diagnosis not present

## 2020-07-06 DIAGNOSIS — Z79899 Other long term (current) drug therapy: Secondary | ICD-10-CM | POA: Diagnosis not present

## 2020-07-06 DIAGNOSIS — M25542 Pain in joints of left hand: Secondary | ICD-10-CM | POA: Diagnosis not present

## 2020-07-07 DIAGNOSIS — L4050 Arthropathic psoriasis, unspecified: Secondary | ICD-10-CM | POA: Diagnosis not present

## 2020-07-13 DIAGNOSIS — R5383 Other fatigue: Secondary | ICD-10-CM | POA: Diagnosis not present

## 2020-07-13 DIAGNOSIS — E790 Hyperuricemia without signs of inflammatory arthritis and tophaceous disease: Secondary | ICD-10-CM | POA: Diagnosis not present

## 2020-07-13 DIAGNOSIS — R197 Diarrhea, unspecified: Secondary | ICD-10-CM | POA: Diagnosis not present

## 2020-07-13 DIAGNOSIS — Z117 Encounter for testing for latent tuberculosis infection: Secondary | ICD-10-CM | POA: Diagnosis not present

## 2020-07-13 DIAGNOSIS — R7982 Elevated C-reactive protein (CRP): Secondary | ICD-10-CM | POA: Diagnosis not present

## 2020-07-13 DIAGNOSIS — L4050 Arthropathic psoriasis, unspecified: Secondary | ICD-10-CM | POA: Diagnosis not present

## 2020-07-13 DIAGNOSIS — Z79899 Other long term (current) drug therapy: Secondary | ICD-10-CM | POA: Diagnosis not present

## 2020-07-13 DIAGNOSIS — Z1159 Encounter for screening for other viral diseases: Secondary | ICD-10-CM | POA: Diagnosis not present

## 2020-07-13 DIAGNOSIS — M79671 Pain in right foot: Secondary | ICD-10-CM | POA: Diagnosis not present

## 2020-08-04 DIAGNOSIS — R7982 Elevated C-reactive protein (CRP): Secondary | ICD-10-CM | POA: Diagnosis not present

## 2020-08-04 DIAGNOSIS — L4050 Arthropathic psoriasis, unspecified: Secondary | ICD-10-CM | POA: Diagnosis not present

## 2020-08-04 DIAGNOSIS — Z79899 Other long term (current) drug therapy: Secondary | ICD-10-CM | POA: Diagnosis not present

## 2020-08-09 ENCOUNTER — Ambulatory Visit (INDEPENDENT_AMBULATORY_CARE_PROVIDER_SITE_OTHER): Payer: Medicare Other

## 2020-08-09 ENCOUNTER — Other Ambulatory Visit: Payer: Self-pay

## 2020-08-09 DIAGNOSIS — E538 Deficiency of other specified B group vitamins: Secondary | ICD-10-CM

## 2020-08-09 MED ORDER — CYANOCOBALAMIN 1000 MCG/ML IJ SOLN
1000.0000 ug | Freq: Once | INTRAMUSCULAR | Status: AC
  Administered 2020-08-09: 1000 ug via INTRAMUSCULAR

## 2020-08-17 DIAGNOSIS — Z96641 Presence of right artificial hip joint: Secondary | ICD-10-CM | POA: Diagnosis not present

## 2020-08-20 ENCOUNTER — Other Ambulatory Visit: Payer: Self-pay | Admitting: Family Medicine

## 2020-08-20 DIAGNOSIS — I10 Essential (primary) hypertension: Secondary | ICD-10-CM

## 2020-08-25 ENCOUNTER — Ambulatory Visit: Payer: Self-pay

## 2020-08-25 ENCOUNTER — Telehealth (INDEPENDENT_AMBULATORY_CARE_PROVIDER_SITE_OTHER): Payer: Medicare Other | Admitting: Family Medicine

## 2020-08-25 ENCOUNTER — Encounter: Payer: Self-pay | Admitting: Family Medicine

## 2020-08-25 ENCOUNTER — Emergency Department
Admission: EM | Admit: 2020-08-25 | Discharge: 2020-08-25 | Disposition: A | Discharge status: Home / Self Care | Payer: Medicare Other | Attending: Emergency Medicine | Admitting: Emergency Medicine

## 2020-08-25 DIAGNOSIS — Z5321 Procedure and treatment not carried out due to patient leaving prior to being seen by health care provider: Secondary | ICD-10-CM | POA: Insufficient documentation

## 2020-08-25 DIAGNOSIS — R509 Fever, unspecified: Secondary | ICD-10-CM | POA: Diagnosis not present

## 2020-08-25 DIAGNOSIS — R0602 Shortness of breath: Secondary | ICD-10-CM | POA: Diagnosis not present

## 2020-08-25 DIAGNOSIS — R0609 Other forms of dyspnea: Secondary | ICD-10-CM | POA: Diagnosis not present

## 2020-08-25 NOTE — Telephone Encounter (Signed)
Please schedule at [phone]

## 2020-08-25 NOTE — ED Triage Notes (Signed)
Pt sent to the ed for low O2 sats from her md. Pt likely has covid. Pt has SOB worse with exertion. Pt wants to have outpt chest xray and states she feels well enough to go home, Encourgaed pt to watch her o2 sats and stay hydrated.

## 2020-08-25 NOTE — Telephone Encounter (Addendum)
Pt. Reports she started feeling bad last Wednesday. Has fatigue and shortness of breath while walking around her home. Had a fever last Thursday - 102. No more fever. "Very mild cough." Has loss of appetite. Has not had a COVID 19 test. Request visit with Dr. Brita Romp only. Next available Virtual visit is next week. Appointment made. Pt. States she will be tested for COVID 19 today or tomorrow.  Answer Assessment - Initial Assessment Questions 1. COVID-19 DIAGNOSIS: "Who made your Coronavirus (COVID-19) diagnosis?" "Was it confirmed by a positive lab test?" If not diagnosed by a HCP, ask "Are there lots of cases (community spread) where you live?" (See public health department website, if unsure)     No test 2. COVID-19 EXPOSURE: "Was there any known exposure to COVID before the symptoms began?" CDC Definition of close contact: within 6 feet (2 meters) for a total of 15 minutes or more over a 24-hour period.      No 3. ONSET: "When did the COVID-19 symptoms start?"      Last Wednesday 4. WORST SYMPTOM: "What is your worst symptom?" (e.g., cough, fever, shortness of breath, muscle aches)     Shortness of [place] 5. COUGH: "Do you have a cough?" If Yes, ask: "How bad is the cough?"       Very mild 6. FEVER: "Do you have a fever?" If Yes, ask: "What is your temperature, how was it measured, and when did it start?"     Last Thursdayy 102 7. RESPIRATORY STATUS: "Describe your breathing?" (e.g., shortness of breath, wheezing, unable to speak)      Shortness of breath with walking around 8. BETTER-SAME-WORSE: "Are you getting better, staying the same or getting worse compared to yesterday?"  If getting worse, ask, "In what way?"     Same 9. HIGH RISK DISEASE: "Do you have any chronic medical problems?" (e.g., asthma, heart or lung disease, weak immune system, obesity, etc.)     No 10. PREGNANCY: "Is there any chance you are pregnant?" "When was your last menstrual period?"       No 11. OTHER  SYMPTOMS: "Do you have any other symptoms?"  (e.g., chills, fatigue, headache, loss of smell or taste, muscle pain, sore throat; new loss of smell or taste especially support the diagnosis of COVID-19)       Fatigue, loss of appetite  Protocols used: CORONAVIRUS (COVID-19) DIAGNOSED OR SUSPECTED-A-AH

## 2020-08-25 NOTE — Progress Notes (Signed)
MyChart Video Visit    Virtual Visit via Video Note   This visit type was conducted due to national recommendations for restrictions regarding the COVID-19 Pandemic (e.g. social distancing) in an effort to limit this patient's exposure and mitigate transmission in our community. This patient is at least at moderate risk for complications without adequate follow up. This format is felt to be most appropriate for this patient at this time. Physical exam was limited by quality of the video and audio technology used for the visit.    Patient location: home Provider location: [place] involved in the visit: patient, provider  I discussed the limitations of evaluation and management by telemedicine and the availability of in person appointments. The patient expressed understanding and agreed to proceed.    Patient: Shannon Grimes   DOB: 02-09-1951   69 y.o. Female  MRN: [id number] Visit Date: 08/25/2020  Today's healthcare provider: [name], MD   Chief Complaint  Patient presents with  . Shortness of Breath   Subjective    HPI   Fatigue and shortness of breath with walking around the house starting 9/25.  Fever on 9/26 of 102F.  Reports very mild cough.  +loss of appetite.  Some improvement starting 9/29, but DOE continues.    +COVID vaccinated, no known sick exposures  Social History   Tobacco Use  . Smoking status: Former Smoker    Years: 10.00    Types: Cigarettes    Quit date: 01/23/1983    Years since quitting: 37.6  . Smokeless tobacco: Never Used  . Tobacco comment: was social smoker, not everyday  Vaping Use  . Vaping Use: Never used  Substance Use Topics  . Alcohol use: No  . Drug use: No      Medications: Outpatient Medications Prior to Visit  Medication Sig  . celecoxib (CELEBREX) 200 MG capsule Take 200 mg by mouth daily as needed.  . cyanocobalamin (,VITAMIN B-12,) 1000 MCG/ML injection INJECT 1 ML AS DIRECTED EVERY  30 (THIRTY) DAYS.  Marland Kitchen dicyclomine (BENTYL) 10 MG capsule Take 1 capsule (10 mg total) by mouth 4 (four) times daily -  before meals and at bedtime. (Patient not taking: Reported on 05/11/2020)  . estradiol (ESTRACE) 2 MG tablet Take 1 tablet (2 mg total) by mouth daily.  . folic acid (FOLVITE) 1 MG tablet folic acid 1 mg tablet  TK 1 T PO QD  . gabapentin (NEURONTIN) 400 MG capsule Take 1 capsule (400 mg total) by mouth 3 (three) times daily.  . hydrochlorothiazide (HYDRODIURIL) 12.5 MG tablet TAKE 1 TABLET DAILY  . HYDROcodone Bitartrate ER (HYSINGLA ER) 20 MG T24A Take 1 tablet by mouth daily.   Marland Kitchen ibuprofen (ADVIL) 200 MG tablet Take 200 mg by mouth every 6 (six) hours as needed.  Marland Kitchen lisinopril (ZESTRIL) 10 MG tablet Take 1 tablet (10 mg total) by mouth daily.  Marland Kitchen lubiprostone (AMITIZA) 24 MCG capsule Take 1 capsule (24 mcg total) by mouth 2 (two) times daily with a meal.  . methotrexate (RHEUMATREX) 2.5 MG tablet 2.5 mg. 8 tablets weekly  . nortriptyline (PAMELOR) 50 MG capsule Take 2 capsules (100 mg total) by mouth at bedtime. (Patient taking differently: Take 100 mg by mouth at bedtime. Takes 1-2 tablets at a time)  . oxyCODONE-acetaminophen (PERCOCET) 10-325 MG tablet Take 1 tablet by mouth every 6 (six) hours as needed.  . OXYCONTIN 20 MG 12 hr tablet Take 20 mg by mouth every 12 (twelve) hours.  Marland Kitchen  promethazine (PHENERGAN) 25 MG tablet Take 1 tablet (25 mg total) by mouth every 4 (four) hours as needed.  . SUMAtriptan (IMITREX) 100 MG tablet Take 1 tablet (100 mg total) by mouth daily as needed. May repeat in 2 hours if headache persists or recurs.  Marland Kitchen tiZANidine (ZANAFLEX) 4 MG tablet Take 1 tablet (4 mg total) by mouth 2 (two) times daily. (Patient taking differently: Take 4 mg by mouth 2 (two) times daily. Taking 1-2 times daily.)  . triamcinolone ointment (KENALOG) 0.5 % Apply 1 application topically 2 (two) times daily.   No facility-administered medications prior to visit.    Review  of Systems  Constitutional: Positive for activity change, appetite change, chills, fatigue and fever.  Respiratory: Positive for cough and shortness of breath.   Cardiovascular: Negative.   Neurological: Negative.   Psychiatric/Behavioral: Negative.        Objective    There were no vitals taken for this visit.    Physical Exam Constitutional:      General: She is not in acute distress.    Appearance: She is diaphoretic.  HENT:     Head: Normocephalic and atraumatic.  Pulmonary:     Effort: Pulmonary effort is normal. No tachypnea.  Neurological:     Mental Status: She is alert and oriented to person, place, and time.  Psychiatric:        Mood and Affect: Mood normal.        Behavior: Behavior normal.        Assessment & Plan     1. Fever, unspecified fever cause 2. Shortness of breath - symptoms concerning for possible COVID19 infection vs pneumonia vsPE vs other infectious process - COVID testing done in our parking lot after visit - was going to send for outpatient CXR, but spO2 at time of COVID testign was 82% on RA and tachycardic - will send to ER for emergent evaluation  - Novel Coronavirus, NAA ([facility]) - DG Chest 2 View; Future   Return if symptoms worsen or fail to improve.     I discussed the assessment and treatment plan with the patient. The patient was provided an opportunity to ask questions and all were answered. The patient agreed with the plan and demonstrated an understanding of the instructions.   The patient was advised to call back or seek an in-person evaluation if the symptoms worsen or if the condition fails to improve as anticipated.   [name], MD [facility] [phone] (phone) [phone] (fax)  [place]

## 2020-08-26 ENCOUNTER — Ambulatory Visit
Admission: RE | Admit: 2020-08-26 | Discharge: 2020-08-26 | Disposition: A | Discharge status: Home / Self Care | Payer: Medicare Other | Source: Ambulatory Visit | Attending: Family Medicine | Admitting: Family Medicine

## 2020-08-26 ENCOUNTER — Other Ambulatory Visit: Payer: Self-pay

## 2020-08-26 ENCOUNTER — Ambulatory Visit
Admission: RE | Admit: 2020-08-26 | Discharge: 2020-08-26 | Disposition: A | Discharge status: Home / Self Care | Payer: Medicare Other | Attending: Family Medicine | Admitting: Family Medicine

## 2020-08-26 DIAGNOSIS — R0600 Dyspnea, unspecified: Secondary | ICD-10-CM | POA: Diagnosis not present

## 2020-08-26 DIAGNOSIS — R509 Fever, unspecified: Secondary | ICD-10-CM | POA: Diagnosis not present

## 2020-08-26 DIAGNOSIS — R0602 Shortness of breath: Secondary | ICD-10-CM | POA: Insufficient documentation

## 2020-08-27 LAB — SPECIMEN STATUS REPORT

## 2020-08-27 LAB — NOVEL CORONAVIRUS, NAA: SARS-CoV-2, NAA: NOT DETECTED

## 2020-08-28 ENCOUNTER — Encounter: Payer: Self-pay | Admitting: Family Medicine

## 2020-08-30 ENCOUNTER — Telehealth: Payer: Self-pay

## 2020-08-30 NOTE — Telephone Encounter (Signed)
-----   Message from Virginia Crews, MD sent at 08/30/2020  8:15 AM EDT ----- Negative COVID test

## 2020-08-30 NOTE — Telephone Encounter (Signed)
Written by Virginia Crews, MD on 08/30/2020 8:15 AM EDT Seen by patient Shannon Grimes on 08/30/2020 8:21 AM

## 2020-08-30 NOTE — Telephone Encounter (Signed)
-----   Message from Virginia Crews, MD sent at 08/30/2020  8:15 AM EDT ----- Lungs are clear on chest XRay.

## 2020-08-31 DIAGNOSIS — H2512 Age-related nuclear cataract, left eye: Secondary | ICD-10-CM | POA: Diagnosis not present

## 2020-08-31 NOTE — Telephone Encounter (Signed)
Ok to cancel appt for her

## 2020-09-01 ENCOUNTER — Telehealth: Payer: Self-pay | Admitting: Family Medicine

## 2020-09-02 DIAGNOSIS — M25551 Pain in right hip: Secondary | ICD-10-CM | POA: Diagnosis not present

## 2020-09-02 DIAGNOSIS — Z96641 Presence of right artificial hip joint: Secondary | ICD-10-CM | POA: Diagnosis not present

## 2020-09-06 ENCOUNTER — Ambulatory Visit: Payer: Self-pay

## 2020-09-06 DIAGNOSIS — Z96641 Presence of right artificial hip joint: Secondary | ICD-10-CM | POA: Diagnosis not present

## 2020-09-07 ENCOUNTER — Other Ambulatory Visit: Payer: Self-pay

## 2020-09-07 ENCOUNTER — Encounter: Payer: Self-pay | Admitting: Family Medicine

## 2020-09-07 ENCOUNTER — Ambulatory Visit (INDEPENDENT_AMBULATORY_CARE_PROVIDER_SITE_OTHER): Payer: Medicare Other

## 2020-09-07 DIAGNOSIS — E538 Deficiency of other specified B group vitamins: Secondary | ICD-10-CM | POA: Diagnosis not present

## 2020-09-07 MED ORDER — CYANOCOBALAMIN 1000 MCG/ML IJ SOLN
1000.0000 ug | Freq: Once | INTRAMUSCULAR | Status: AC
  Administered 2020-09-07: 1000 ug via INTRAMUSCULAR

## 2020-09-19 ENCOUNTER — Other Ambulatory Visit: Payer: Self-pay | Admitting: Family Medicine

## 2020-09-19 DIAGNOSIS — Z1231 Encounter for screening mammogram for malignant neoplasm of breast: Secondary | ICD-10-CM

## 2020-09-21 DIAGNOSIS — M25451 Effusion, right hip: Secondary | ICD-10-CM | POA: Diagnosis not present

## 2020-09-21 DIAGNOSIS — Z96641 Presence of right artificial hip joint: Secondary | ICD-10-CM | POA: Diagnosis not present

## 2020-09-23 DIAGNOSIS — M79671 Pain in right foot: Secondary | ICD-10-CM | POA: Diagnosis not present

## 2020-09-23 DIAGNOSIS — Z79899 Other long term (current) drug therapy: Secondary | ICD-10-CM | POA: Diagnosis not present

## 2020-09-23 DIAGNOSIS — R5383 Other fatigue: Secondary | ICD-10-CM | POA: Diagnosis not present

## 2020-09-23 DIAGNOSIS — L4050 Arthropathic psoriasis, unspecified: Secondary | ICD-10-CM | POA: Diagnosis not present

## 2020-09-23 DIAGNOSIS — Z117 Encounter for testing for latent tuberculosis infection: Secondary | ICD-10-CM | POA: Diagnosis not present

## 2020-09-23 DIAGNOSIS — R7982 Elevated C-reactive protein (CRP): Secondary | ICD-10-CM | POA: Diagnosis not present

## 2020-09-23 DIAGNOSIS — R197 Diarrhea, unspecified: Secondary | ICD-10-CM | POA: Diagnosis not present

## 2020-09-23 DIAGNOSIS — Z1159 Encounter for screening for other viral diseases: Secondary | ICD-10-CM | POA: Diagnosis not present

## 2020-09-23 DIAGNOSIS — E790 Hyperuricemia without signs of inflammatory arthritis and tophaceous disease: Secondary | ICD-10-CM | POA: Diagnosis not present

## 2020-09-24 ENCOUNTER — Other Ambulatory Visit: Payer: Self-pay | Admitting: Physician Assistant

## 2020-09-24 ENCOUNTER — Other Ambulatory Visit: Payer: Self-pay | Admitting: Family Medicine

## 2020-09-24 DIAGNOSIS — I10 Essential (primary) hypertension: Secondary | ICD-10-CM

## 2020-09-28 DIAGNOSIS — M25542 Pain in joints of left hand: Secondary | ICD-10-CM | POA: Diagnosis not present

## 2020-09-28 DIAGNOSIS — M79671 Pain in right foot: Secondary | ICD-10-CM | POA: Diagnosis not present

## 2020-09-28 DIAGNOSIS — Z79891 Long term (current) use of opiate analgesic: Secondary | ICD-10-CM | POA: Diagnosis not present

## 2020-09-28 DIAGNOSIS — M25559 Pain in unspecified hip: Secondary | ICD-10-CM | POA: Diagnosis not present

## 2020-09-28 DIAGNOSIS — R7982 Elevated C-reactive protein (CRP): Secondary | ICD-10-CM | POA: Diagnosis not present

## 2020-09-28 DIAGNOSIS — M25531 Pain in right wrist: Secondary | ICD-10-CM | POA: Diagnosis not present

## 2020-09-28 DIAGNOSIS — M25532 Pain in left wrist: Secondary | ICD-10-CM | POA: Diagnosis not present

## 2020-09-28 DIAGNOSIS — M79672 Pain in left foot: Secondary | ICD-10-CM | POA: Diagnosis not present

## 2020-09-28 DIAGNOSIS — M25541 Pain in joints of right hand: Secondary | ICD-10-CM | POA: Diagnosis not present

## 2020-09-28 DIAGNOSIS — Z79899 Other long term (current) drug therapy: Secondary | ICD-10-CM | POA: Diagnosis not present

## 2020-09-28 DIAGNOSIS — Z5181 Encounter for therapeutic drug level monitoring: Secondary | ICD-10-CM | POA: Diagnosis not present

## 2020-09-29 DIAGNOSIS — L4050 Arthropathic psoriasis, unspecified: Secondary | ICD-10-CM | POA: Diagnosis not present

## 2020-09-30 DIAGNOSIS — Z96641 Presence of right artificial hip joint: Secondary | ICD-10-CM | POA: Diagnosis not present

## 2020-10-05 ENCOUNTER — Ambulatory Visit (INDEPENDENT_AMBULATORY_CARE_PROVIDER_SITE_OTHER): Payer: Medicare Other

## 2020-10-05 ENCOUNTER — Other Ambulatory Visit: Payer: Self-pay

## 2020-10-05 DIAGNOSIS — E538 Deficiency of other specified B group vitamins: Secondary | ICD-10-CM

## 2020-10-05 MED ORDER — CYANOCOBALAMIN 1000 MCG/ML IJ SOLN
1000.0000 ug | Freq: Once | INTRAMUSCULAR | Status: AC
  Administered 2020-10-05: 1000 ug via INTRAMUSCULAR

## 2020-10-17 DIAGNOSIS — L4050 Arthropathic psoriasis, unspecified: Secondary | ICD-10-CM | POA: Diagnosis not present

## 2020-10-17 DIAGNOSIS — Z79899 Other long term (current) drug therapy: Secondary | ICD-10-CM | POA: Diagnosis not present

## 2020-10-17 DIAGNOSIS — R7982 Elevated C-reactive protein (CRP): Secondary | ICD-10-CM | POA: Diagnosis not present

## 2020-10-18 IMAGING — CT CT ABD-PELV W/ CM
2 of 5 series · 16 of 46 positions shown, 18 images · IV contrast (iopamidol)
Comparison: 07/17/2018 chest CT

CLINICAL DATA: 67-year-old female with RIGHT abdominal pain for 4
months. History of pancreatitis.

EXAM:
CT ABDOMEN AND PELVIS WITH CONTRAST
TECHNIQUE: Multidetector CT imaging of the abdomen and pelvis was performed
using the standard protocol following bolus administration of
intravenous contrast.
CONTRAST:  100mL G70F76-HMM IOPAMIDOL (G70F76-HMM) INJECTION 61%

[Series 2: abd pelvis (person_name) · axial · 0.78mm/px · z∈[-1521,-1121]mm · 13 of 92 slices shown, 15 images (1 of 2)]
[im 6/92  soft-tissue]
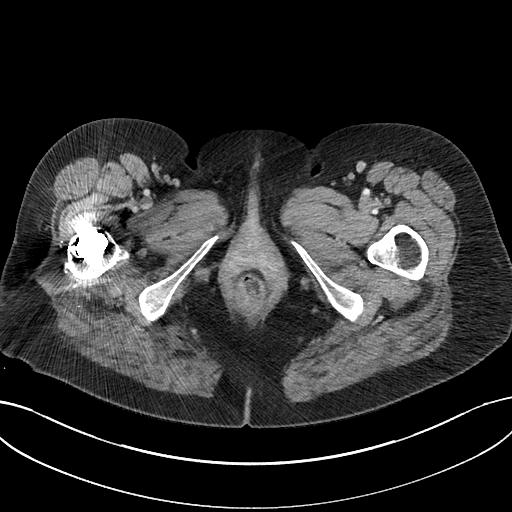
[im 6/92  bone]
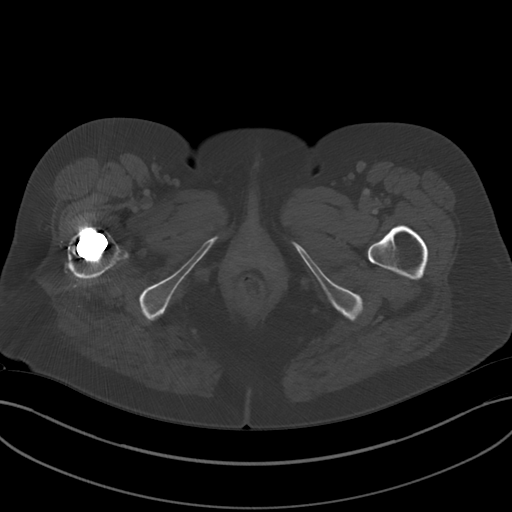
[im 11/92  soft-tissue]
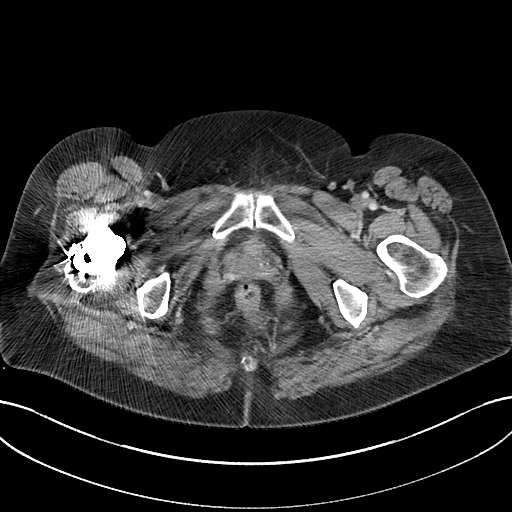
[im 21/92  soft-tissue]
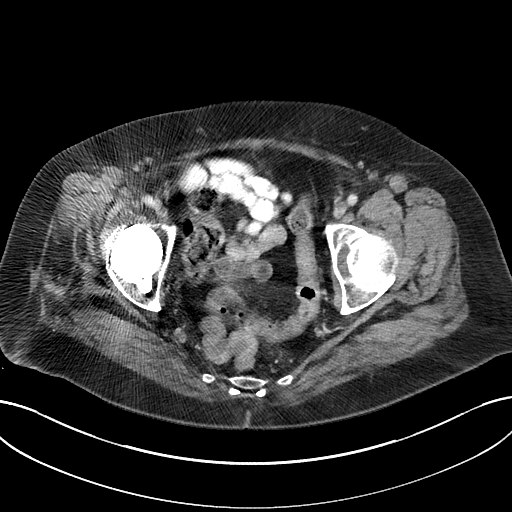
[im 26/92  soft-tissue]
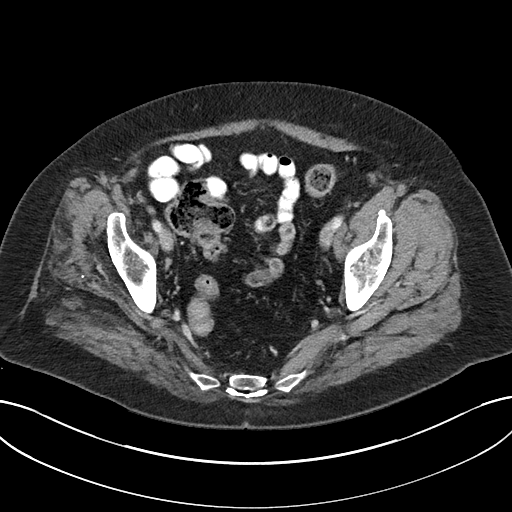
[im 31/92  soft-tissue]
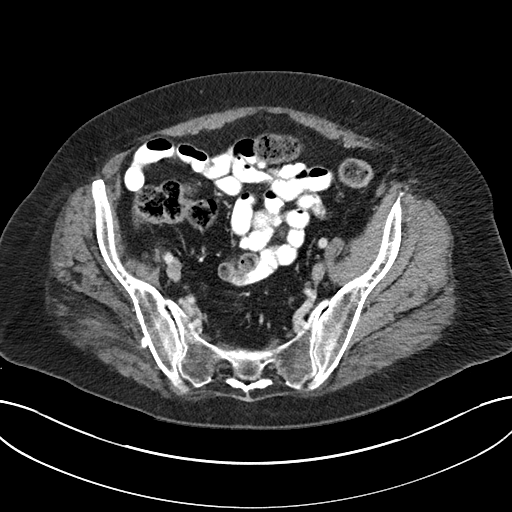
[im 41/92  soft-tissue]
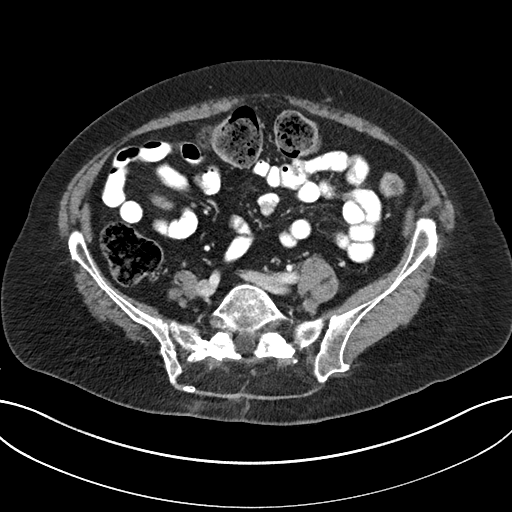
[im 46/92  soft-tissue]
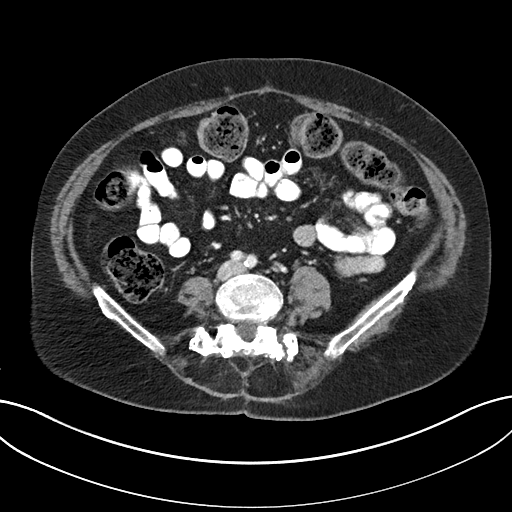
[im 51/92  soft-tissue]
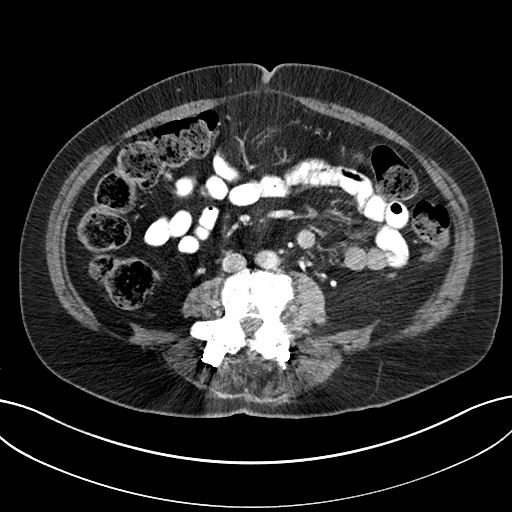
[im 61/92  soft-tissue]
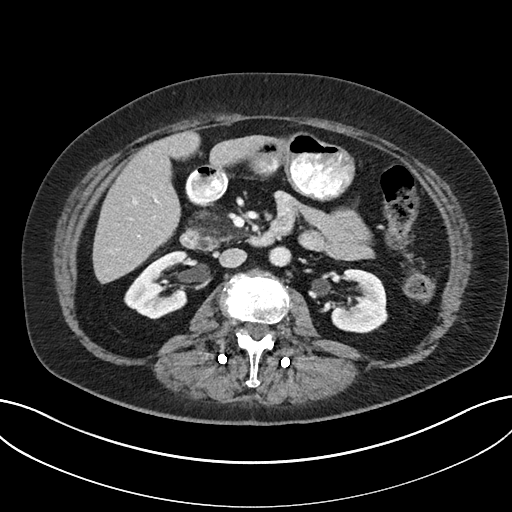
[im 61/92  bone]
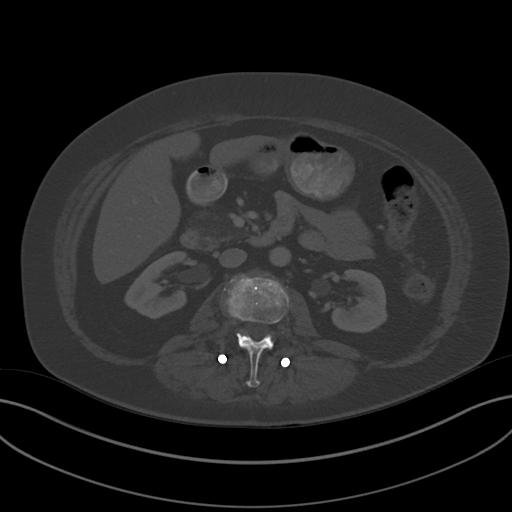
[im 66/92  soft-tissue]
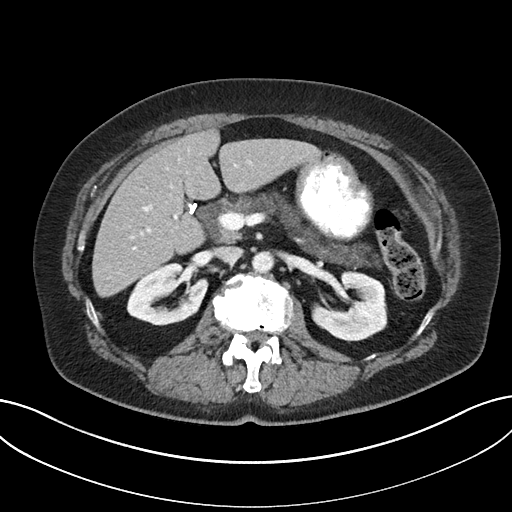
[im 71/92  soft-tissue]
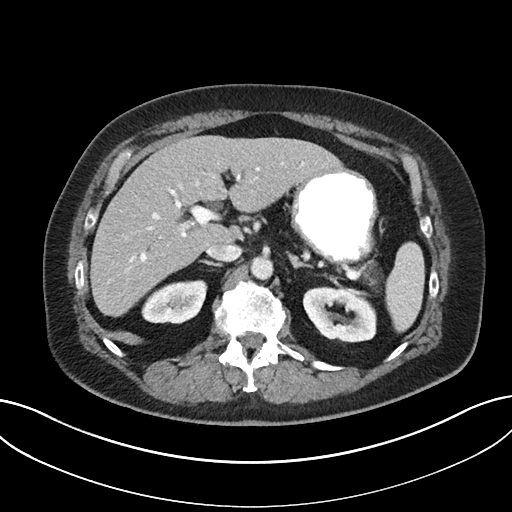
[im 81/92  soft-tissue]
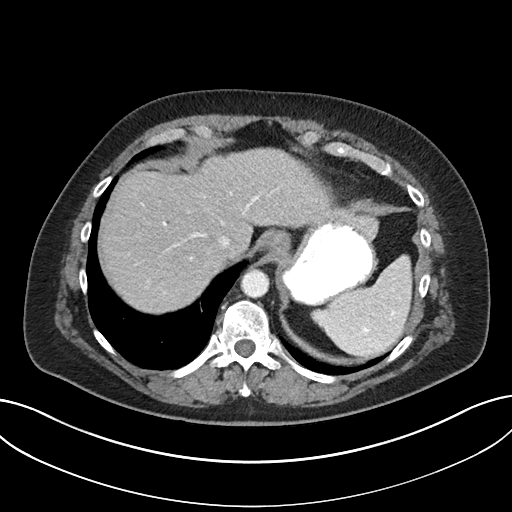
[im 86/92  soft-tissue]
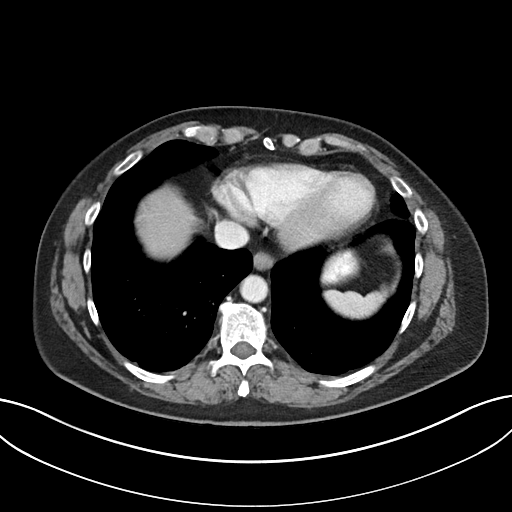

[Series 4: abd pelvis (person_name) · coronal · 0.78mm/px · 3 of 195 slices shown (2 of 2)]
[im 65/195  soft-tissue]
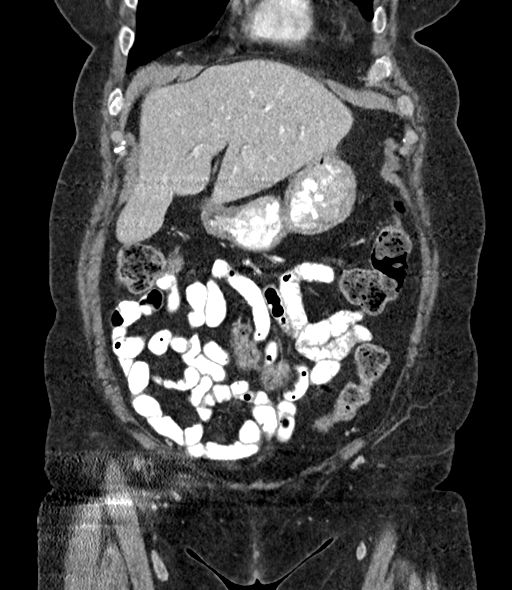
[im 87/195  soft-tissue]
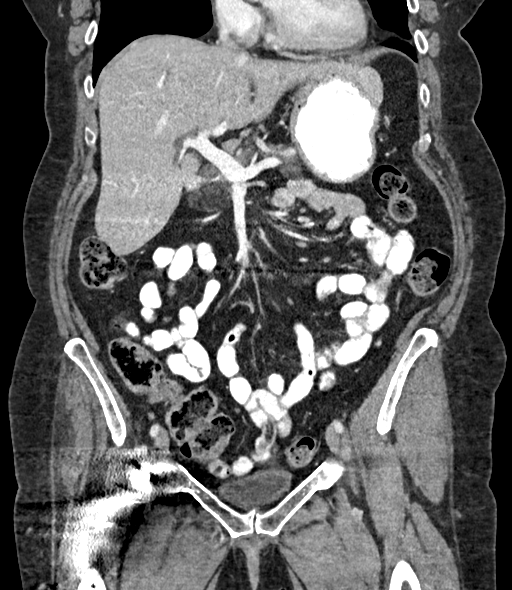
[im 108/195  soft-tissue]
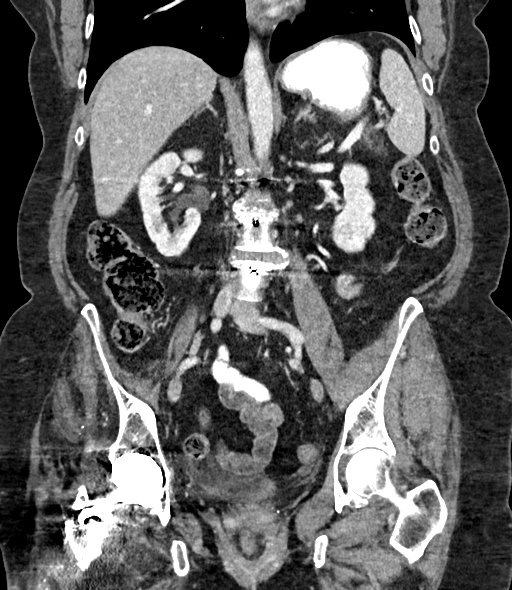

[16 of 46 positions shown; findings below may reference images not displayed]

FINDINGS: Lower chest: No acute abnormality

Hepatobiliary: Mild fullness of the CBD (9 mm) and intrahepatic
biliary system again noted. Patient is status post cholecystectomy.
No other hepatic abnormalities noted.

Pancreas: Mild fatty atrophy of the pancreas noted. No acute
inflammation, mass or pancreatic ductal dilatation.

Spleen: Normal in size without focal abnormality.

Adrenals/Urinary Tract: The kidneys, adrenal glands and bladder are
unremarkable.

Stomach/Bowel: Stomach is within normal limits. No evidence of bowel
wall thickening, distention, or inflammatory changes.

Vascular/Lymphatic: Mild aortic atherosclerosis. No enlarged
abdominal or pelvic lymph nodes.

Reproductive: Status post hysterectomy. No adnexal masses.

Other: No ascites, focal collection or pneumoperitoneum.

Musculoskeletal: No acute or suspicious bony abnormalities. Lumbar
spine fusion changes, RIGHT SI joint screw fixation and RIGHT total
hip arthroplasty noted.
IMPRESSION: 1. Mild fatty atrophy of the pancreas without discrete pancreatic
mass, acute pancreatic inflammation or pancreatic ductal dilatation.
2. Mild CBD and intrahepatic biliary system fullness without
obstructing cause, which may be secondary to cholecystectomy
changes.
3.  Aortic Atherosclerosis ([id number]).

## 2020-10-20 ENCOUNTER — Other Ambulatory Visit: Payer: Self-pay

## 2020-10-20 ENCOUNTER — Ambulatory Visit
Admission: RE | Admit: 2020-10-20 | Discharge: 2020-10-20 | Disposition: A | Discharge status: Home / Self Care | Payer: Medicare Other | Source: Ambulatory Visit | Attending: Family Medicine | Admitting: Family Medicine

## 2020-10-20 DIAGNOSIS — Z1231 Encounter for screening mammogram for malignant neoplasm of breast: Secondary | ICD-10-CM | POA: Insufficient documentation

## 2020-10-21 ENCOUNTER — Telehealth: Payer: Self-pay

## 2020-10-21 NOTE — Telephone Encounter (Signed)
Pt advised.   Thanks,   -Reilly Blades  

## 2020-10-21 NOTE — Telephone Encounter (Signed)
-----   Message from Virginia Crews, MD sent at 10/21/2020  2:20 PM EDT ----- Normal mammogram. Repeat in 1 yr

## 2020-10-24 DIAGNOSIS — Z23 Encounter for immunization: Secondary | ICD-10-CM | POA: Diagnosis not present

## 2020-11-02 ENCOUNTER — Ambulatory Visit (INDEPENDENT_AMBULATORY_CARE_PROVIDER_SITE_OTHER): Payer: Medicare Other

## 2020-11-02 ENCOUNTER — Other Ambulatory Visit: Payer: Self-pay

## 2020-11-02 DIAGNOSIS — E538 Deficiency of other specified B group vitamins: Secondary | ICD-10-CM

## 2020-11-02 MED ORDER — CYANOCOBALAMIN 1000 MCG/ML IJ SOLN
1000.0000 ug | Freq: Once | INTRAMUSCULAR | Status: AC
  Administered 2020-11-02: 1000 ug via INTRAMUSCULAR

## 2020-11-08 ENCOUNTER — Encounter: Payer: Self-pay | Admitting: Family Medicine

## 2020-11-08 ENCOUNTER — Other Ambulatory Visit: Payer: Self-pay

## 2020-11-08 ENCOUNTER — Ambulatory Visit (INDEPENDENT_AMBULATORY_CARE_PROVIDER_SITE_OTHER): Payer: Medicare Other | Admitting: Family Medicine

## 2020-11-08 VITALS — BP 124/82 | HR 80

## 2020-11-08 DIAGNOSIS — Z789 Other specified health status: Secondary | ICD-10-CM

## 2020-11-08 DIAGNOSIS — F4321 Adjustment disorder with depressed mood: Secondary | ICD-10-CM

## 2020-11-08 DIAGNOSIS — Z23 Encounter for immunization: Secondary | ICD-10-CM

## 2020-11-08 DIAGNOSIS — E538 Deficiency of other specified B group vitamins: Secondary | ICD-10-CM | POA: Diagnosis not present

## 2020-11-08 DIAGNOSIS — I10 Essential (primary) hypertension: Secondary | ICD-10-CM | POA: Diagnosis not present

## 2020-11-08 DIAGNOSIS — R7303 Prediabetes: Secondary | ICD-10-CM | POA: Diagnosis not present

## 2020-11-08 DIAGNOSIS — G43109 Migraine with aura, not intractable, without status migrainosus: Secondary | ICD-10-CM | POA: Diagnosis not present

## 2020-11-08 DIAGNOSIS — E782 Mixed hyperlipidemia: Secondary | ICD-10-CM

## 2020-11-08 DIAGNOSIS — F5104 Psychophysiologic insomnia: Secondary | ICD-10-CM

## 2020-11-08 DIAGNOSIS — I700 Atherosclerosis of aorta: Secondary | ICD-10-CM | POA: Diagnosis not present

## 2020-11-08 MED ORDER — SUMATRIPTAN SUCCINATE 100 MG PO TABS: ORAL_TABLET | ORAL | 5 refills | Status: DC

## 2020-11-08 MED ORDER — NORTRIPTYLINE HCL 50 MG PO CAPS: 50.0000 mg | ORAL_CAPSULE | Freq: Every day | ORAL | 1 refills | Status: DC

## 2020-11-08 MED ORDER — TIZANIDINE HCL 4 MG PO TABS: 4.0000 mg | ORAL_TABLET | Freq: Two times a day (BID) | ORAL | 2 refills | Status: DC

## 2020-11-08 MED ORDER — LISINOPRIL 10 MG PO TABS: 10.0000 mg | ORAL_TABLET | Freq: Every day | ORAL | 1 refills | Status: DC

## 2020-11-08 MED ORDER — HYDROCHLOROTHIAZIDE 12.5 MG PO TABS: 12.5000 mg | ORAL_TABLET | Freq: Every day | ORAL | 3 refills | Status: DC

## 2020-11-08 MED ORDER — PROMETHAZINE HCL 25 MG PO TABS: 25.0000 mg | ORAL_TABLET | ORAL | 1 refills | Status: DC | PRN

## 2020-11-08 NOTE — Assessment & Plan Note (Signed)
Related to grief Discussed CBTi Discussed limitations in medications related to polypharmacy

## 2020-11-08 NOTE — Assessment & Plan Note (Signed)
Continue injections Recheck B12 level

## 2020-11-08 NOTE — Assessment & Plan Note (Signed)
Intolerant due to pancreatitis 

## 2020-11-08 NOTE — Addendum Note (Signed)
Addended by: Shawna Orleans on: 11/08/2020 11:58 AM   Modules accepted: Orders

## 2020-11-08 NOTE — Patient Instructions (Incomplete)

## 2020-11-08 NOTE — Assessment & Plan Note (Signed)
Discussed risk factor management Statin intolerant

## 2020-11-08 NOTE — Assessment & Plan Note (Signed)
Grieving the loss of her son Discussed counseling resources

## 2020-11-08 NOTE — Progress Notes (Signed)
Established patient visit   Patient: Shannon Grimes   DOB: [birth year]   69 y.o. Female  MRN: [id number] Visit Date: 11/08/2020  Today's healthcare provider: [name], MD   Chief Complaint  Patient presents with  . Hyperlipidemia  . Hypertension   Subjective    HPI  Hypertension, follow-up  BP Readings from Last 3 Encounters:  11/08/20 124/82  08/25/20 (!) 180/75  05/12/20 112/73   Wt Readings from Last 3 Encounters:  05/12/20 172 lb (78 kg)  03/07/20 161 lb (73 kg)  11/11/19 163 lb (73.9 kg)     She was last seen for hypertension 6 months ago.  BP at that visit was 112/73. Management since that visit includes no changes.  She reports excellent compliance with treatment. She is not having side effects.  She is following a Regular diet. She is not exercising. She does not smoke.  Use of agents associated with hypertension: none.   Outside blood pressures are stable. Symptoms: No chest pain No chest pressure  No palpitations No syncope  No dyspnea No orthopnea  No paroxysmal nocturnal dyspnea No lower extremity edema   Pertinent labs: Lab Results  Component Value Date   CHOL 146 11/11/2019   HDL 31 (L) 11/11/2019   LDLCALC 84 11/11/2019   TRIG 180 (H) 11/11/2019   CHOLHDL 4.7 (H) 11/11/2019   Lab Results  Component Value Date   NA 141 05/12/2020   K 3.9 05/12/2020   CREATININE 0.85 05/12/2020   GFRNONAA 70 05/12/2020   GFRAA 81 05/12/2020   GLUCOSE 97 05/12/2020     The 10-year ASCVD risk score Mikey Bussing DC Jr., et al., 2013) is: 11%   --------------------------------------------------------------------------------------------------- Lipid/Cholesterol, Follow-up  Last lipid panel Other pertinent labs  Lab Results  Component Value Date   CHOL 146 11/11/2019   HDL 31 (L) 11/11/2019   LDLCALC 84 11/11/2019   TRIG 180 (H) 11/11/2019   CHOLHDL 4.7 (H) 11/11/2019   Lab Results  Component Value Date   ALT 24 10/28/2018   AST 34  10/28/2018   PLT 280 03/07/2020   TSH 1.88 11/19/2017     She was last seen for this 6 months ago.  Management since that visit includes no changes.  Not on statins due to pancreatitis.  Symptoms: No chest pain No chest pressure/discomfort  No dyspnea No lower extremity edema  No numbness or tingling of extremity No orthopnea  No palpitations No paroxysmal nocturnal dyspnea  No speech difficulty No syncope   Current diet: in general, a "healthy" diet   Current exercise: aerobics and swimming  The 10-year ASCVD risk score Mikey Bussing DC Brooke Bonito., et al., 2013) is: 11%  ---------------------------------------------------------------------------------------------------   Patient Active Problem List   Diagnosis Date Noted  . Statin intolerance 11/08/2020  . Grief 11/08/2020  . Psychophysiological insomnia 11/08/2020  . Prediabetes 11/11/2019  . Dyspnea on exertion 07/06/2019  . Pain and numbness of right upper extremity 10/01/2018  . Painful and cold upper extremity 10/01/2018  . Constipation due to opioid therapy 09/29/2018  . Aortic atherosclerosis ([facility]) 08/15/2018  . Vitamin B12 deficiency 05/28/2018  . Palpitations 11/11/2017  . Chronic back pain 11/11/2017  . Migraines 10/16/2017  . Fibromyalgia 10/16/2017  . Osteopenia 10/16/2017  . Hyperlipidemia 10/16/2017  . Overweight 10/16/2017  . Pernicious anemia 10/16/2017  . Hypertension 10/16/2017   Social History   Tobacco Use  . Smoking status: Former Smoker    Years: 10.00    Types: Cigarettes  Quit date: 01/23/1983    Years since quitting: 37.8  . Smokeless tobacco: Never Used  . Tobacco comment: was social smoker, not everyday  Vaping Use  . Vaping Use: Never used  Substance Use Topics  . Alcohol use: No  . Drug use: No   Allergies  Allergen Reactions  . Pentazocine Anaphylaxis  . Statins Other (See Comments)    pancreatitis  . Morphine Nausea And Vomiting    And migraine       Medications: Outpatient  Medications Prior to Visit  Medication Sig  . celecoxib (CELEBREX) 200 MG capsule Take 200 mg by mouth daily as needed.  . cyanocobalamin (,VITAMIN B-12,) 1000 MCG/ML injection INJECT 1 ML AS DIRECTED EVERY 30 (THIRTY) DAYS.  Marland Kitchen estradiol (ESTRACE) 2 MG tablet Take 1 tablet (2 mg total) by mouth daily.  . folic acid (FOLVITE) 1 MG tablet folic acid 1 mg tablet  TK 1 T PO QD  . gabapentin (NEURONTIN) 400 MG capsule TAKE 1 CAPSULE 3 TIMES A   DAY  . HYDROcodone Bitartrate ER (HYSINGLA ER) 20 MG T24A Take 1 tablet by mouth daily.   Marland Kitchen ibuprofen (ADVIL) 200 MG tablet Take 200 mg by mouth every 6 (six) hours as needed.  . lubiprostone (AMITIZA) 24 MCG capsule Take 1 capsule (24 mcg total) by mouth 2 (two) times daily with a meal.  . methotrexate (RHEUMATREX) 2.5 MG tablet 2.5 mg. 8 tablets weekly  . [DISCONTINUED] hydrochlorothiazide (HYDRODIURIL) 12.5 MG tablet TAKE 1 TABLET DAILY  . [DISCONTINUED] lisinopril (ZESTRIL) 10 MG tablet Take 1 tablet (10 mg total) by mouth daily.  . [DISCONTINUED] nortriptyline (PAMELOR) 50 MG capsule TAKE 2 CAPSULES (100MG      TOTAL) AT BEDTIME  . [DISCONTINUED] promethazine (PHENERGAN) 25 MG tablet Take 1 tablet (25 mg total) by mouth every 4 (four) hours as needed.  . [DISCONTINUED] SUMAtriptan (IMITREX) 100 MG tablet TAKE 1 TABLET DAILY AS     NEEDED. MAY REPEAT IN 2    HOURS IF HEADACHE PERSISTS OR RECURS  . [DISCONTINUED] tiZANidine (ZANAFLEX) 4 MG tablet Take 1 tablet (4 mg total) by mouth 2 (two) times daily. (Patient taking differently: Take 4 mg by mouth 2 (two) times daily. Taking 1-2 times daily.)  . [DISCONTINUED] dicyclomine (BENTYL) 10 MG capsule Take 1 capsule (10 mg total) by mouth 4 (four) times daily -  before meals and at bedtime. (Patient not taking: Reported on 05/11/2020)  . [DISCONTINUED] oxyCODONE-acetaminophen (PERCOCET) 10-325 MG tablet Take 1 tablet by mouth every 6 (six) hours as needed. (Patient not taking: Reported on 11/08/2020)  .  [DISCONTINUED] OXYCONTIN 20 MG 12 hr tablet Take 20 mg by mouth every 12 (twelve) hours.  . [DISCONTINUED] triamcinolone ointment (KENALOG) 0.5 % Apply 1 application topically 2 (two) times daily. (Patient not taking: Reported on 11/08/2020)   No facility-administered medications prior to visit.    Review of Systems  Constitutional: Positive for activity change.  Respiratory: Negative.   Cardiovascular: Negative.   Musculoskeletal: Positive for arthralgias and myalgias.  Psychiatric/Behavioral: Positive for sleep disturbance. The patient is nervous/anxious.     Last CBC Lab Results  Component Value Date   WBC 5.6 03/07/2020   HGB 12.6 03/07/2020   HCT 38.7 03/07/2020   MCV 89.8 03/07/2020   MCH 29.2 03/07/2020   RDW 15.9 (H) 03/07/2020   PLT 280 03/07/2020   Last hemoglobin A1c Lab Results  Component Value Date   HGBA1C 5.5 05/12/2020   Last thyroid functions Lab Results  Component Value Date   TSH 1.88 11/19/2017   Last vitamin D No results found for: 25OHVITD2, 25OHVITD3, VD25OH Last vitamin B12 and Folate Lab Results  Component Value Date   VITAMINB12 679 05/12/2020      Objective    BP 124/82 (BP Location: Left Arm, Patient Position: Sitting, Cuff Size: Normal)   Pulse 80  BP Readings from Last 3 Encounters:  11/08/20 124/82  08/25/20 (!) 180/75  05/12/20 112/73   Wt Readings from Last 3 Encounters:  05/12/20 172 lb (78 kg)  03/07/20 161 lb (73 kg)  11/11/19 163 lb (73.9 kg)      Physical Exam Vitals reviewed.  Constitutional:      General: She is not in acute distress.    Appearance: Normal appearance. She is well-developed. She is not diaphoretic.  HENT:     Head: Normocephalic and atraumatic.  Eyes:     General: No scleral icterus.    Conjunctiva/sclera: Conjunctivae normal.  Neck:     Thyroid: No thyromegaly.  Cardiovascular:     Rate and Rhythm: Normal rate and regular rhythm.     Pulses: Normal pulses.     Heart sounds: Normal heart  sounds. No murmur heard.   Pulmonary:     Effort: Pulmonary effort is normal. No respiratory distress.     Breath sounds: Normal breath sounds. No wheezing, rhonchi or rales.  Musculoskeletal:     Cervical back: Neck supple.     Right lower leg: No edema.     Left lower leg: No edema.  Lymphadenopathy:     Cervical: No cervical adenopathy.  Skin:    General: Skin is warm and dry.     Findings: No rash.  Neurological:     Mental Status: She is alert and oriented to person, place, and time. Mental status is at baseline.  Psychiatric:        Mood and Affect: Mood is depressed. Affect is tearful.        Speech: Speech normal.        Behavior: Behavior normal.        Thought Content: Thought content does not include homicidal or suicidal ideation.       No results found for any visits on 11/08/20.  Assessment & Plan     Problem List Items Addressed This Visit      Cardiovascular and Mediastinum   Migraines    Worsening Discussed stressors and lack of sleep that are contributing currently Decrease nortriptyline to 50mg  qhs      Relevant Medications   nortriptyline (PAMELOR) 50 MG capsule   lisinopril (ZESTRIL) 10 MG tablet   tiZANidine (ZANAFLEX) 4 MG tablet   SUMAtriptan (IMITREX) 100 MG tablet   hydrochlorothiazide (HYDRODIURIL) 12.5 MG tablet   Hypertension    Well controlled Continue current medications Recheck metabolic panel F/u in 6 months       Relevant Medications   lisinopril (ZESTRIL) 10 MG tablet   hydrochlorothiazide (HYDRODIURIL) 12.5 MG tablet   Other Relevant Orders   Comprehensive metabolic panel   Aortic atherosclerosis (HCC)    Discussed risk factor management Statin intolerant      Relevant Medications   lisinopril (ZESTRIL) 10 MG tablet   hydrochlorothiazide (HYDRODIURIL) 12.5 MG tablet     Other   Hyperlipidemia    Reviewed last lipid panel Not currently on a statin - intolerant due to pancreatitis Recheck FLP and CMP Discussed  diet and exercise       Relevant Medications  lisinopril (ZESTRIL) 10 MG tablet   hydrochlorothiazide (HYDRODIURIL) 12.5 MG tablet   Other Relevant Orders   Comprehensive metabolic panel   Lipid panel   Vitamin B12 deficiency    Continue injections Recheck B12 level      Relevant Orders   B12   Prediabetes    Recheck Hgb A1c Encourage low carb diet      Relevant Orders   Hemoglobin A1c   Statin intolerance    Intolerant due to pancreatitis      Grief    Grieving the loss of her son Discussed counseling resources      Psychophysiological insomnia - Primary    Related to grief Discussed CBTi Discussed limitations in medications related to polypharmacy       Other Visit Diagnoses    Essential hypertension       Relevant Medications   lisinopril (ZESTRIL) 10 MG tablet   hydrochlorothiazide (HYDRODIURIL) 12.5 MG tablet       Return in about 3 months (around 02/08/2021) for chronic disease f/u.       Total time spent on today's visit was greater than 40 minutes, including both face-to-face time and nonface-to-face time personally spent on review of chart (labs and imaging), discussing labs and goals, discussing further work-up, treatment options, referrals to specialist if needed, reviewing outside records of pertinent, answering patient's questions, and coordinating care.    I, [name], MD, have reviewed all documentation for this visit. The documentation on 11/08/20 for the exam, diagnosis, procedures, and orders are all accurate and complete.   Shannon Grimes, [name], MD, MPH [facility]

## 2020-11-08 NOTE — Assessment & Plan Note (Signed)
Well controlled Continue current medications Recheck metabolic panel F/u in 6 months  

## 2020-11-08 NOTE — Assessment & Plan Note (Addendum)
Worsening Discussed stressors and lack of sleep that are contributing currently Decrease nortriptyline to 50mg  qhs

## 2020-11-08 NOTE — Assessment & Plan Note (Signed)
Reviewed last lipid panel Not currently on a statin - intolerant due to pancreatitis Recheck FLP and CMP Discussed diet and exercise

## 2020-11-08 NOTE — Assessment & Plan Note (Signed)
Recheck Hgb A1c Encourage low carb diet

## 2020-11-09 LAB — LIPID PANEL
Chol/HDL Ratio: 5.1 ratio — ABNORMAL HIGH (ref 0.0–4.4)
Cholesterol, Total: 149 mg/dL (ref 100–199)
HDL: 29 mg/dL — ABNORMAL LOW (ref >39)
LDL Chol Calc (NIH): 86 mg/dL (ref 0–99)
Triglycerides: 196 mg/dL — ABNORMAL HIGH (ref 0–149)
VLDL Cholesterol Cal: 34 mg/dL (ref 5–40)

## 2020-11-09 LAB — COMPREHENSIVE METABOLIC PANEL
ALT: 11 IU/L (ref 0–32)
AST: 20 IU/L (ref 0–40)
Albumin/Globulin Ratio: 1.2 (ref 1.2–2.2)
Albumin: 4.3 g/dL (ref 3.8–4.8)
Alkaline Phosphatase: 71 IU/L (ref 44–121)
BUN/Creatinine Ratio: 22 (ref 12–28)
BUN: 21 mg/dL (ref 8–27)
Bilirubin Total: 0.3 mg/dL (ref 0.0–1.2)
CO2: 24 mmol/L (ref 20–29)
Calcium: 9.5 mg/dL (ref 8.7–10.3)
Chloride: 103 mmol/L (ref 96–106)
Creatinine, Ser: 0.96 mg/dL (ref 0.57–1.00)
GFR calc Af Amer: 70 mL/min/{1.73_m2} (ref >59)
GFR calc non Af Amer: 61 mL/min/{1.73_m2} (ref >59)
Globulin, Total: 3.5 g/dL (ref 1.5–4.5)
Glucose: 96 mg/dL (ref 65–99)
Potassium: 4.6 mmol/L (ref 3.5–5.2)
Sodium: 143 mmol/L (ref 134–144)
Total Protein: 7.8 g/dL (ref 6.0–8.5)

## 2020-11-09 LAB — HEMOGLOBIN A1C
Est. average glucose Bld gHb Est-mCnc: 111 mg/dL
Hgb A1c MFr Bld: 5.5 % (ref 4.8–5.6)

## 2020-11-09 LAB — VITAMIN B12: Vitamin B-12: 752 pg/mL (ref 232–1245)

## 2020-11-10 ENCOUNTER — Telehealth: Payer: Self-pay

## 2020-11-10 NOTE — Telephone Encounter (Signed)
-----   Message from Virginia Crews, MD sent at 11/10/2020  8:36 AM EST ----- Normal/stable labs

## 2020-11-10 NOTE — Telephone Encounter (Signed)
Pt advised.   Thanks,   -Norbert Malkin  

## 2020-11-15 ENCOUNTER — Ambulatory Visit: Payer: Self-pay | Admitting: Family Medicine

## 2020-11-24 DIAGNOSIS — L4050 Arthropathic psoriasis, unspecified: Secondary | ICD-10-CM | POA: Diagnosis not present

## 2020-11-25 DIAGNOSIS — L4050 Arthropathic psoriasis, unspecified: Secondary | ICD-10-CM | POA: Diagnosis not present

## 2020-11-25 DIAGNOSIS — Z79899 Other long term (current) drug therapy: Secondary | ICD-10-CM | POA: Diagnosis not present

## 2020-11-30 ENCOUNTER — Ambulatory Visit (INDEPENDENT_AMBULATORY_CARE_PROVIDER_SITE_OTHER): Payer: Medicare Other

## 2020-11-30 ENCOUNTER — Other Ambulatory Visit: Payer: Self-pay

## 2020-11-30 DIAGNOSIS — E538 Deficiency of other specified B group vitamins: Secondary | ICD-10-CM | POA: Diagnosis not present

## 2020-11-30 MED ORDER — CYANOCOBALAMIN 1000 MCG/ML IJ SOLN
1000.0000 ug | Freq: Once | INTRAMUSCULAR | Status: AC
  Administered 2020-11-30: 1000 ug via INTRAMUSCULAR

## 2020-12-05 ENCOUNTER — Encounter: Payer: Self-pay | Admitting: Family Medicine

## 2020-12-12 ENCOUNTER — Telehealth: Payer: Self-pay

## 2020-12-12 NOTE — Telephone Encounter (Signed)
Copied from [place] [id number]. Topic: General - Other >> Dec 12, 2020  1:33 PM Erick Blinks wrote: Reason for CRM: Pt called regarding a fax to Dr. Renee Rival with [facility]. She sent a my chart message December 13th. Her appt is on the 29th  Best contact: [phone]

## 2020-12-12 NOTE — Telephone Encounter (Signed)
See My chart message

## 2020-12-12 NOTE — Telephone Encounter (Signed)
Medical records, can we ff this as requested? Please let the patient know when it is done.  This message must have been stuck in Mychart purgatory as I am getting it 1 wk after it was sent.  My apologies.

## 2020-12-21 DIAGNOSIS — L4050 Arthropathic psoriasis, unspecified: Secondary | ICD-10-CM | POA: Diagnosis not present

## 2020-12-23 ENCOUNTER — Other Ambulatory Visit: Payer: Self-pay | Admitting: Physician Assistant

## 2020-12-28 ENCOUNTER — Other Ambulatory Visit: Payer: Self-pay

## 2020-12-28 ENCOUNTER — Ambulatory Visit (INDEPENDENT_AMBULATORY_CARE_PROVIDER_SITE_OTHER): Payer: Medicare Other

## 2020-12-28 DIAGNOSIS — M25532 Pain in left wrist: Secondary | ICD-10-CM | POA: Diagnosis not present

## 2020-12-28 DIAGNOSIS — M25541 Pain in joints of right hand: Secondary | ICD-10-CM | POA: Diagnosis not present

## 2020-12-28 DIAGNOSIS — R7982 Elevated C-reactive protein (CRP): Secondary | ICD-10-CM | POA: Diagnosis not present

## 2020-12-28 DIAGNOSIS — M25531 Pain in right wrist: Secondary | ICD-10-CM | POA: Diagnosis not present

## 2020-12-28 DIAGNOSIS — M25559 Pain in unspecified hip: Secondary | ICD-10-CM | POA: Diagnosis not present

## 2020-12-28 DIAGNOSIS — E538 Deficiency of other specified B group vitamins: Secondary | ICD-10-CM

## 2020-12-28 DIAGNOSIS — M79671 Pain in right foot: Secondary | ICD-10-CM | POA: Diagnosis not present

## 2020-12-28 DIAGNOSIS — M79672 Pain in left foot: Secondary | ICD-10-CM | POA: Diagnosis not present

## 2020-12-28 DIAGNOSIS — Z79899 Other long term (current) drug therapy: Secondary | ICD-10-CM | POA: Diagnosis not present

## 2020-12-28 DIAGNOSIS — M25542 Pain in joints of left hand: Secondary | ICD-10-CM | POA: Diagnosis not present

## 2020-12-28 MED ORDER — CYANOCOBALAMIN 1000 MCG/ML IJ SOLN
1000.0000 ug | Freq: Once | INTRAMUSCULAR | Status: AC
  Administered 2020-12-28: 1000 ug via INTRAMUSCULAR

## 2021-01-20 DIAGNOSIS — Z79899 Other long term (current) drug therapy: Secondary | ICD-10-CM | POA: Diagnosis not present

## 2021-01-20 DIAGNOSIS — Z9225 Personal history of immunosupression therapy: Secondary | ICD-10-CM | POA: Diagnosis not present

## 2021-01-20 DIAGNOSIS — L4050 Arthropathic psoriasis, unspecified: Secondary | ICD-10-CM | POA: Diagnosis not present

## 2021-01-20 DIAGNOSIS — G894 Chronic pain syndrome: Secondary | ICD-10-CM | POA: Diagnosis not present

## 2021-01-20 DIAGNOSIS — G8929 Other chronic pain: Secondary | ICD-10-CM | POA: Diagnosis not present

## 2021-01-20 DIAGNOSIS — D84821 Immunodeficiency due to drugs: Secondary | ICD-10-CM | POA: Diagnosis not present

## 2021-01-20 DIAGNOSIS — I10 Essential (primary) hypertension: Secondary | ICD-10-CM | POA: Diagnosis not present

## 2021-01-20 DIAGNOSIS — I878 Other specified disorders of veins: Secondary | ICD-10-CM | POA: Diagnosis not present

## 2021-01-20 DIAGNOSIS — G43909 Migraine, unspecified, not intractable, without status migrainosus: Secondary | ICD-10-CM | POA: Diagnosis not present

## 2021-01-20 DIAGNOSIS — Z87891 Personal history of nicotine dependence: Secondary | ICD-10-CM | POA: Diagnosis not present

## 2021-01-20 DIAGNOSIS — Z885 Allergy status to narcotic agent status: Secondary | ICD-10-CM | POA: Diagnosis not present

## 2021-01-20 DIAGNOSIS — M797 Fibromyalgia: Secondary | ICD-10-CM | POA: Diagnosis not present

## 2021-01-21 ENCOUNTER — Other Ambulatory Visit: Payer: Self-pay | Admitting: Family Medicine

## 2021-01-21 ENCOUNTER — Other Ambulatory Visit: Payer: Self-pay | Admitting: Physician Assistant

## 2021-01-22 NOTE — Telephone Encounter (Signed)
Requested Prescriptions  Pending Prescriptions Disp Refills  . estradiol (ESTRACE) 2 MG tablet [Pharmacy Med Name: ESTRADIOL TAB 2MG ] 90 tablet 0    Sig: TAKE 1 TABLET DAILY     OB/GYN:  Estrogens Passed - 01/21/2021  8:18 PM      Passed - Mammogram is up-to-date per Health Maintenance      Passed - Last BP in normal range    BP Readings from Last 1 Encounters:  11/08/20 124/82         Passed - Valid encounter within last 12 months    Recent Outpatient Visits          2 months ago Psychophysiological insomnia   [facility], [name], MD   5 months ago Fever, unspecified fever cause   [facility], [name], MD   8 months ago Essential hypertension   [facility] [place], [name], MD   1 year ago Essential hypertension   [place], [name], MD   1 year ago [facility], [name], MD      Future Appointments            In 2 weeks [place], [name], MD [facility] [place]

## 2021-01-25 ENCOUNTER — Ambulatory Visit: Payer: Self-pay

## 2021-01-25 DIAGNOSIS — L4050 Arthropathic psoriasis, unspecified: Secondary | ICD-10-CM | POA: Diagnosis not present

## 2021-02-01 ENCOUNTER — Ambulatory Visit (INDEPENDENT_AMBULATORY_CARE_PROVIDER_SITE_OTHER): Payer: Medicare Other

## 2021-02-01 ENCOUNTER — Other Ambulatory Visit: Payer: Self-pay

## 2021-02-01 DIAGNOSIS — E538 Deficiency of other specified B group vitamins: Secondary | ICD-10-CM | POA: Diagnosis not present

## 2021-02-01 MED ORDER — CYANOCOBALAMIN 1000 MCG/ML IJ SOLN
1000.0000 ug | INTRAMUSCULAR | Status: DC
  Administered 2021-02-01: 1000 ug via INTRAMUSCULAR

## 2021-02-06 DIAGNOSIS — L03032 Cellulitis of left toe: Secondary | ICD-10-CM | POA: Diagnosis not present

## 2021-02-09 ENCOUNTER — Other Ambulatory Visit: Payer: Self-pay | Admitting: Family Medicine

## 2021-02-09 ENCOUNTER — Encounter: Payer: Self-pay | Admitting: Family Medicine

## 2021-02-09 ENCOUNTER — Other Ambulatory Visit: Payer: Self-pay

## 2021-02-09 ENCOUNTER — Ambulatory Visit (INDEPENDENT_AMBULATORY_CARE_PROVIDER_SITE_OTHER): Payer: Medicare Other | Admitting: Family Medicine

## 2021-02-09 VITALS — BP 147/84 | HR 83 | Temp 98.0°F | Ht 68.5 in | Wt 179.3 lb

## 2021-02-09 DIAGNOSIS — E782 Mixed hyperlipidemia: Secondary | ICD-10-CM

## 2021-02-09 DIAGNOSIS — E663 Overweight: Secondary | ICD-10-CM | POA: Diagnosis not present

## 2021-02-09 DIAGNOSIS — D510 Vitamin B12 deficiency anemia due to intrinsic factor deficiency: Secondary | ICD-10-CM

## 2021-02-09 DIAGNOSIS — I700 Atherosclerosis of aorta: Secondary | ICD-10-CM

## 2021-02-09 DIAGNOSIS — E538 Deficiency of other specified B group vitamins: Secondary | ICD-10-CM | POA: Diagnosis not present

## 2021-02-09 DIAGNOSIS — R7303 Prediabetes: Secondary | ICD-10-CM | POA: Diagnosis not present

## 2021-02-09 DIAGNOSIS — M8580 Other specified disorders of bone density and structure, unspecified site: Secondary | ICD-10-CM

## 2021-02-09 DIAGNOSIS — Z780 Asymptomatic menopausal state: Secondary | ICD-10-CM | POA: Diagnosis not present

## 2021-02-09 DIAGNOSIS — K5903 Drug induced constipation: Secondary | ICD-10-CM | POA: Diagnosis not present

## 2021-02-09 DIAGNOSIS — T402X5A Adverse effect of other opioids, initial encounter: Secondary | ICD-10-CM

## 2021-02-09 DIAGNOSIS — L4050 Arthropathic psoriasis, unspecified: Secondary | ICD-10-CM

## 2021-02-09 DIAGNOSIS — I10 Essential (primary) hypertension: Secondary | ICD-10-CM | POA: Diagnosis not present

## 2021-02-09 DIAGNOSIS — Z789 Other specified health status: Secondary | ICD-10-CM

## 2021-02-09 LAB — POCT GLYCOSYLATED HEMOGLOBIN (HGB A1C): Hemoglobin A1C: 5.5 % (ref 4.0–5.6)

## 2021-02-09 MED ORDER — BETAMETHASONE DIPROPIONATE 0.05 % EX CREA: TOPICAL_CREAM | Freq: Two times a day (BID) | CUTANEOUS | 1 refills | Status: DC

## 2021-02-09 MED ORDER — LISINOPRIL-HYDROCHLOROTHIAZIDE 10-12.5 MG PO TABS
1.0000 | ORAL_TABLET | Freq: Every day | ORAL | 3 refills | Status: DC
Start: 2021-02-09 — End: 2022-03-29

## 2021-02-09 MED ORDER — LUBIPROSTONE 24 MCG PO CAPS
24.0000 ug | ORAL_CAPSULE | Freq: Two times a day (BID) | ORAL | 3 refills | Status: DC
Start: 2021-02-09 — End: 2022-05-17

## 2021-02-09 MED ORDER — LISINOPRIL-HYDROCHLOROTHIAZIDE 10-12.5 MG PO TABS: 1.0000 | ORAL_TABLET | Freq: Every day | ORAL | 3 refills | Status: DC

## 2021-02-09 NOTE — Progress Notes (Signed)
Established patient visit   Patient: Shannon Grimes   DOB: 05/22/1951   70 y.o. Female  MRN: [id number] Visit Date: 02/09/2021  Today's healthcare provider: [name], MD   Chief Complaint  Patient presents with  . Hyperlipidemia  . Prediabetes   Subjective    HPI  Diabetes Mellitus Type II, Follow-up  Lab Results  Component Value Date   HGBA1C 5.5 02/09/2021   HGBA1C 5.5 11/08/2020   HGBA1C 5.5 05/12/2020   Wt Readings from Last 3 Encounters:  02/09/21 179 lb 4.8 oz (81.3 kg)  05/12/20 172 lb (78 kg)  03/07/20 161 lb (73 kg)   Last seen for diabetes 3 months ago.  Management since then includes encouraged low carb diet. She reports excellent compliance with treatment. She is not having side effects.  Symptoms: No fatigue - except with psoriatic athr flare ups No foot ulcerations  No appetite changes Yes nausea - when taking methotrexate  No paresthesia of the feet  No polydipsia  No polyuria No visual disturbances   Yes vomiting -  when taking methotrexate     Home blood sugar records: N/A  Episodes of hypoglycemia? No    Current insulin regiment:  Most Recent Eye Exam: 10/18/2020 Current exercise: none - she normally does but she has in-grown toe nail Current diet habits: in general, a "healthy" diet    Pertinent Labs: Lab Results  Component Value Date   CHOL 149 11/08/2020   HDL 29 (L) 11/08/2020   LDLCALC 86 11/08/2020   TRIG 196 (H) 11/08/2020   CHOLHDL 5.1 (H) 11/08/2020   Lab Results  Component Value Date   NA 143 11/08/2020   K 4.6 11/08/2020   CREATININE 0.96 11/08/2020   GFRNONAA 61 11/08/2020   GFRAA 70 11/08/2020   GLUCOSE 96 11/08/2020     --------------------------------------------------------------------------------------------------- Lipid/Cholesterol, Follow-up  Last lipid panel Other pertinent labs  Lab Results  Component Value Date   CHOL 149 11/08/2020   HDL 29 (L) 11/08/2020   LDLCALC 86 11/08/2020    TRIG 196 (H) 11/08/2020   CHOLHDL 5.1 (H) 11/08/2020   Lab Results  Component Value Date   ALT 11 11/08/2020   AST 20 11/08/2020   PLT 280 03/07/2020   TSH 1.88 11/19/2017     She was last seen for this 3 months ago.  Management since that visit includes diet and exercise.  She reports good compliance with treatment. She is not having side effects.   Symptoms: No chest pain No chest pressure/discomfort  No dyspnea Yes lower extremity edema - right leg edema from psoriatic arthritis  Yes numbness or tingling of extremity - in right leg from past surgery No orthopnea  No palpitations No paroxysmal nocturnal dyspnea  No speech difficulty No syncope   Current diet: in general, a "healthy" diet   Current exercise: none  The 10-year ASCVD risk score Mikey Bussing DC Jr., et al., 2013) is: 15.4%  ---------------------------------------------------------------------------------------------------  Hypertension, follow-up  BP Readings from Last 3 Encounters:  02/09/21 (!) 147/84  11/08/20 124/82  08/25/20 (!) 180/75   Wt Readings from Last 3 Encounters:  02/09/21 179 lb 4.8 oz (81.3 kg)  05/12/20 172 lb (78 kg)  03/07/20 161 lb (73 kg)     She was last seen for hypertension 3 months ago.  BP at that visit was 124/82. Management since that visit includes continue current medications.  She reports excellent compliance with treatment. She is not having side effects.  She  is following a Regular diet. She is not exercising. She does not smoke.  Use of agents associated with hypertension: none.   Outside blood pressures are N/A. Symptoms: No chest pain No chest pressure  No palpitations No syncope  No dyspnea No orthopnea  No paroxysmal nocturnal dyspnea Yes lower extremity edema   Pertinent labs: Lab Results  Component Value Date   CHOL 149 11/08/2020   HDL 29 (L) 11/08/2020   LDLCALC 86 11/08/2020   TRIG 196 (H) 11/08/2020   CHOLHDL 5.1 (H) 11/08/2020   Lab Results   Component Value Date   NA 143 11/08/2020   K 4.6 11/08/2020   CREATININE 0.96 11/08/2020   GFRNONAA 61 11/08/2020   GFRAA 70 11/08/2020   GLUCOSE 96 11/08/2020     The 10-year ASCVD risk score Mikey Bussing DC Jr., et al., 2013) is: 15.4%   ---------------------------------------------------------------------------------------------------    Social History   Tobacco Use  . Smoking status: Former Smoker    Years: 10.00    Types: Cigarettes    Quit date: 01/23/1983    Years since quitting: 38.0  . Smokeless tobacco: Never Used  . Tobacco comment: was social smoker, not everyday  Vaping Use  . Vaping Use: Never used  Substance Use Topics  . Alcohol use: No  . Drug use: No       Medications: Outpatient Medications Prior to Visit  Medication Sig  . celecoxib (CELEBREX) 200 MG capsule Take 200 mg by mouth daily as needed.  . cyanocobalamin (,VITAMIN B-12,) 1000 MCG/ML injection INJECT 1 ML AS DIRECTED EVERY 30 (THIRTY) DAYS.  Marland Kitchen estradiol (ESTRACE) 2 MG tablet TAKE 1 TABLET DAILY  . folic acid (FOLVITE) 1 MG tablet folic acid 1 mg tablet  TK 1 T PO QD  . gabapentin (NEURONTIN) 400 MG capsule TAKE 1 CAPSULE 3 TIMES A   DAY  . HYDROcodone Bitartrate ER 20 MG T24A Take 1 tablet by mouth daily.   Marland Kitchen ibuprofen (ADVIL) 200 MG tablet Take 200 mg by mouth every 6 (six) hours as needed.  . inFLIXimab in sodium chloride 0.9 % Inject into the vein.  . methotrexate (RHEUMATREX) 2.5 MG tablet 2.5 mg. 8 tablets weekly  . nortriptyline (PAMELOR) 50 MG capsule Take 1 capsule (50 mg total) by mouth at bedtime.  . promethazine (PHENERGAN) 25 MG tablet Take 1 tablet (25 mg total) by mouth every 4 (four) hours as needed.  . SUMAtriptan (IMITREX) 100 MG tablet TAKE 1 TABLET DAILY AS     NEEDED. MAY REPEAT IN 2    HOURS IF HEADACHE PERSISTS OR RECURS  . tiZANidine (ZANAFLEX) 4 MG tablet Take 1 tablet (4 mg total) by mouth 2 (two) times daily.  . [DISCONTINUED] hydrochlorothiazide (HYDRODIURIL) 12.5 MG  tablet Take 1 tablet (12.5 mg total) by mouth daily.  . [DISCONTINUED] lisinopril (ZESTRIL) 10 MG tablet Take 1 tablet (10 mg total) by mouth daily.  . [DISCONTINUED] lubiprostone (AMITIZA) 24 MCG capsule Take 1 capsule (24 mcg total) by mouth 2 (two) times daily with a meal. (Patient not taking: Reported on 02/09/2021)   Facility-Administered Medications Prior to Visit  Medication Dose Route Frequency Provider  . cyanocobalamin ((VITAMIN B-12)) injection 1,000 mcg  1,000 mcg Intramuscular Q30 days Virginia Crews, MD    Review of Systems  Constitutional: Negative.   Respiratory: Negative.   Cardiovascular: Negative.   Neurological: Negative.   Psychiatric/Behavioral: Negative.        Objective    BP (!) 147/84 (BP Location:  Left Arm, Patient Position: Sitting, Cuff Size: Normal)   Pulse 83   Temp 98 F (36.7 C) (Oral)   Ht 5' 8.5" (1.74 m)   Wt 179 lb 4.8 oz (81.3 kg)   BMI 26.87 kg/m      Physical Exam Vitals reviewed.  Constitutional:      General: She is not in acute distress.    Appearance: Normal appearance. She is well-developed. She is not diaphoretic.  HENT:     Head: Normocephalic and atraumatic.  Eyes:     General: No scleral icterus.    Conjunctiva/sclera: Conjunctivae normal.  Neck:     Thyroid: No thyromegaly.  Cardiovascular:     Rate and Rhythm: Normal rate and regular rhythm.     Pulses: Normal pulses.     Heart sounds: Normal heart sounds. No murmur heard.   Pulmonary:     Effort: Pulmonary effort is normal. No respiratory distress.     Breath sounds: Normal breath sounds. No wheezing, rhonchi or rales.  Musculoskeletal:     Cervical back: Neck supple.     Right lower leg: No edema.     Left lower leg: No edema.  Lymphadenopathy:     Cervical: No cervical adenopathy.  Skin:    General: Skin is warm and dry.     Findings: No rash.  Neurological:     Mental Status: She is alert and oriented to person, place, and time. Mental status is  at baseline.  Psychiatric:        Mood and Affect: Mood normal.        Behavior: Behavior normal.     Results for orders placed or performed in visit on 02/09/21  POCT HgB A1C  Result Value Ref Range   Hemoglobin A1C 5.5 4.0 - 5.6 %   HbA1c POC (<> result, manual entry)     HbA1c, POC (prediabetic range)     HbA1c, POC (controlled diabetic range)      Assessment & Plan     Problem List Items Addressed This Visit      Cardiovascular and Mediastinum   Hypertension    Elevated today, but planning to increase exercise and monitor at home No changes to medication Reviewed recent CMP      Relevant Medications   lisinopril-hydrochlorothiazide (ZESTORETIC) 10-12.5 MG tablet   Aortic atherosclerosis (HCC) - Primary    Continue risk factor management Encourage exercise       Relevant Medications   lisinopril-hydrochlorothiazide (ZESTORETIC) 10-12.5 MG tablet     Digestive   Constipation due to opioid therapy    Refilled amitiza Going to f/u with GI        Musculoskeletal and Integument   Osteopenia    Repeat DEXA Now higher risk given psoriatic arthritis      Relevant Orders   DG BONE DENSITY (DXA)   Psoriatic arthritis ([place])    Followed by Rheum On remicade and MTX Doing well        Other   Hyperlipidemia    Reviewed last lipid panel Continue lifestyle management Statin intolerant      Relevant Medications   lisinopril-hydrochlorothiazide (ZESTORETIC) 10-12.5 MG tablet   Overweight    Discussed importance of healthy weight management Discussed diet and exercise       Pernicious anemia    Reviewed last B12      Vitamin B12 deficiency    Continue injections monthly Reviewed last B12 level - well controlled      Prediabetes  A1c stable Resume low carb diet      Relevant Orders   POCT HgB A1C (Completed)   Statin intolerance    Intolerant due to pancreatitis       Other Visit Diagnoses    Postmenopausal       Relevant Orders   DG  BONE DENSITY (DXA)      Will call Dr Vicente Males to schedule colonoscopy   Return in about 6 months (around 08/09/2021) for AWV, chronic disease f/u.      I, [name], MD, have reviewed all documentation for this visit. The documentation on 02/09/21 for the exam, diagnosis, procedures, and orders are all accurate and complete.   Jolanda Mccann, [name], MD, MPH [facility]

## 2021-02-09 NOTE — Assessment & Plan Note (Signed)
Refilled amitiza Going to f/u with GI

## 2021-02-09 NOTE — Assessment & Plan Note (Signed)
Reviewed last lipid panel Continue lifestyle management Statin intolerant

## 2021-02-09 NOTE — Assessment & Plan Note (Signed)
Continue injections monthly Reviewed last B12 level - well controlled

## 2021-02-09 NOTE — Assessment & Plan Note (Signed)
Reviewed last B12

## 2021-02-09 NOTE — Assessment & Plan Note (Signed)
Continue risk factor management Encourage exercise

## 2021-02-09 NOTE — Telephone Encounter (Signed)
Requested medication (s) are due for refill today: yes  Requested medication (s) are on the active medication list: yes  Last refill:    Future visit scheduled:yes  Notes to clinic:   Pharmacy comment: Script Clarification:NEED ALT, NOT ON FORMULARY.     Requested Prescriptions  Pending Prescriptions Disp Refills   AMITIZA 24 MCG capsule [Pharmacy Med Name: AMITIZA 24 MCG CAPSULES] 180 capsule 3    Sig: TAKE 1 CAPSULE (24 MCG TOTAL) BY MOUTH 2 (TWO) TIMES DAILY WITH A MEAL.      Gastroenterology: Irritable Bowel Syndrome Passed - 02/09/2021 12:21 PM      Passed - Valid encounter within last 12 months    Recent Outpatient Visits           Today Aortic atherosclerosis [facility])   [facility] [place], [name], MD   3 months ago Psychophysiological insomnia   [facility] [place], [name], MD   5 months ago Fever, unspecified fever cause   [facility], [name], MD   9 months ago Essential hypertension   [facility] [place], [name], MD   1 year ago Essential hypertension   [place], [name], MD       Future Appointments             In 6 months [place], [name], MD [facility], [place]

## 2021-02-09 NOTE — Assessment & Plan Note (Signed)
Intolerant due to pancreatitis

## 2021-02-09 NOTE — Assessment & Plan Note (Signed)
A1c stable Resume low carb diet

## 2021-02-09 NOTE — Assessment & Plan Note (Signed)
Elevated today, but planning to increase exercise and monitor at home No changes to medication Reviewed recent CMP

## 2021-02-09 NOTE — Assessment & Plan Note (Signed)
Followed by Rheum On remicade and MTX Doing well

## 2021-02-09 NOTE — Assessment & Plan Note (Signed)
Repeat DEXA Now higher risk given psoriatic arthritis

## 2021-02-09 NOTE — Assessment & Plan Note (Signed)
Discussed importance of healthy weight management Discussed diet and exercise  

## 2021-02-15 DIAGNOSIS — Z96641 Presence of right artificial hip joint: Secondary | ICD-10-CM | POA: Diagnosis not present

## 2021-02-15 DIAGNOSIS — L4050 Arthropathic psoriasis, unspecified: Secondary | ICD-10-CM | POA: Diagnosis not present

## 2021-02-20 DIAGNOSIS — L03032 Cellulitis of left toe: Secondary | ICD-10-CM | POA: Diagnosis not present

## 2021-02-27 DIAGNOSIS — Z79899 Other long term (current) drug therapy: Secondary | ICD-10-CM | POA: Diagnosis not present

## 2021-02-27 DIAGNOSIS — L4050 Arthropathic psoriasis, unspecified: Secondary | ICD-10-CM | POA: Diagnosis not present

## 2021-03-01 DIAGNOSIS — H2512 Age-related nuclear cataract, left eye: Secondary | ICD-10-CM | POA: Diagnosis not present

## 2021-03-01 DIAGNOSIS — H40003 Preglaucoma, unspecified, bilateral: Secondary | ICD-10-CM | POA: Diagnosis not present

## 2021-03-06 DIAGNOSIS — M542 Cervicalgia: Secondary | ICD-10-CM | POA: Diagnosis not present

## 2021-03-06 DIAGNOSIS — M25512 Pain in left shoulder: Secondary | ICD-10-CM | POA: Diagnosis not present

## 2021-03-08 ENCOUNTER — Other Ambulatory Visit: Payer: Self-pay

## 2021-03-08 ENCOUNTER — Ambulatory Visit (INDEPENDENT_AMBULATORY_CARE_PROVIDER_SITE_OTHER): Payer: Medicare Other

## 2021-03-08 DIAGNOSIS — E538 Deficiency of other specified B group vitamins: Secondary | ICD-10-CM

## 2021-03-08 MED ORDER — CYANOCOBALAMIN 1000 MCG/ML IJ SOLN
1000.0000 ug | Freq: Once | INTRAMUSCULAR | Status: AC
  Administered 2021-03-08: 1000 ug via INTRAMUSCULAR

## 2021-03-09 DIAGNOSIS — S43432A Superior glenoid labrum lesion of left shoulder, initial encounter: Secondary | ICD-10-CM | POA: Diagnosis not present

## 2021-03-09 DIAGNOSIS — M4802 Spinal stenosis, cervical region: Secondary | ICD-10-CM | POA: Diagnosis not present

## 2021-03-09 DIAGNOSIS — M24812 Other specific joint derangements of left shoulder, not elsewhere classified: Secondary | ICD-10-CM | POA: Diagnosis not present

## 2021-03-09 DIAGNOSIS — M25512 Pain in left shoulder: Secondary | ICD-10-CM | POA: Diagnosis not present

## 2021-03-09 DIAGNOSIS — M75112 Incomplete rotator cuff tear or rupture of left shoulder, not specified as traumatic: Secondary | ICD-10-CM | POA: Diagnosis not present

## 2021-03-09 DIAGNOSIS — M47812 Spondylosis without myelopathy or radiculopathy, cervical region: Secondary | ICD-10-CM | POA: Diagnosis not present

## 2021-03-09 DIAGNOSIS — M129 Arthropathy, unspecified: Secondary | ICD-10-CM | POA: Diagnosis not present

## 2021-03-09 DIAGNOSIS — M542 Cervicalgia: Secondary | ICD-10-CM | POA: Diagnosis not present

## 2021-03-15 ENCOUNTER — Other Ambulatory Visit: Payer: Self-pay

## 2021-03-15 ENCOUNTER — Ambulatory Visit
Admission: RE | Admit: 2021-03-15 | Discharge: 2021-03-15 | Disposition: A | Discharge status: Home / Self Care | Payer: Medicare Other | Source: Ambulatory Visit | Attending: Family Medicine | Admitting: Family Medicine

## 2021-03-15 DIAGNOSIS — Z780 Asymptomatic menopausal state: Secondary | ICD-10-CM | POA: Diagnosis not present

## 2021-03-15 DIAGNOSIS — M8580 Other specified disorders of bone density and structure, unspecified site: Secondary | ICD-10-CM | POA: Insufficient documentation

## 2021-03-15 DIAGNOSIS — M85852 Other specified disorders of bone density and structure, left thigh: Secondary | ICD-10-CM | POA: Diagnosis not present

## 2021-03-16 ENCOUNTER — Other Ambulatory Visit: Payer: Medicare Other

## 2021-03-17 ENCOUNTER — Telehealth: Payer: Self-pay

## 2021-03-17 NOTE — Telephone Encounter (Signed)
-----   Message from Virginia Crews, MD sent at 03/17/2021  8:21 AM EDT ----- Bone density scan shows osteopenia (this is some bone loss, but not as bad as osteoporosis).  Recommend regular weight bearing exercise, avoiding smoking, and adequate Ca (1200mg /day) and Vit D (1000 units daily) via diet or supplement.  We will recheck in 2 years to ensure this hasn't worsened.

## 2021-03-20 DIAGNOSIS — M5412 Radiculopathy, cervical region: Secondary | ICD-10-CM | POA: Diagnosis not present

## 2021-03-20 DIAGNOSIS — M25512 Pain in left shoulder: Secondary | ICD-10-CM | POA: Diagnosis not present

## 2021-03-22 DIAGNOSIS — L4050 Arthropathic psoriasis, unspecified: Secondary | ICD-10-CM | POA: Diagnosis not present

## 2021-03-23 DIAGNOSIS — M4722 Other spondylosis with radiculopathy, cervical region: Secondary | ICD-10-CM | POA: Diagnosis not present

## 2021-03-29 DIAGNOSIS — M25542 Pain in joints of left hand: Secondary | ICD-10-CM | POA: Diagnosis not present

## 2021-03-29 DIAGNOSIS — Z79891 Long term (current) use of opiate analgesic: Secondary | ICD-10-CM | POA: Diagnosis not present

## 2021-03-29 DIAGNOSIS — R7982 Elevated C-reactive protein (CRP): Secondary | ICD-10-CM | POA: Diagnosis not present

## 2021-03-29 DIAGNOSIS — M79672 Pain in left foot: Secondary | ICD-10-CM | POA: Diagnosis not present

## 2021-03-29 DIAGNOSIS — Z79899 Other long term (current) drug therapy: Secondary | ICD-10-CM | POA: Diagnosis not present

## 2021-03-29 DIAGNOSIS — M25532 Pain in left wrist: Secondary | ICD-10-CM | POA: Diagnosis not present

## 2021-03-29 DIAGNOSIS — M79671 Pain in right foot: Secondary | ICD-10-CM | POA: Diagnosis not present

## 2021-03-29 DIAGNOSIS — M25559 Pain in unspecified hip: Secondary | ICD-10-CM | POA: Diagnosis not present

## 2021-03-29 DIAGNOSIS — M25531 Pain in right wrist: Secondary | ICD-10-CM | POA: Diagnosis not present

## 2021-03-29 DIAGNOSIS — M25541 Pain in joints of right hand: Secondary | ICD-10-CM | POA: Diagnosis not present

## 2021-04-04 ENCOUNTER — Encounter: Payer: Self-pay | Admitting: Family Medicine

## 2021-04-04 ENCOUNTER — Other Ambulatory Visit: Payer: Self-pay

## 2021-04-04 MED ORDER — ESTRADIOL 2 MG PO TABS: 2.0000 mg | ORAL_TABLET | Freq: Every day | ORAL | 1 refills | Status: DC

## 2021-04-05 ENCOUNTER — Ambulatory Visit: Payer: Medicare Other

## 2021-04-07 DIAGNOSIS — Z23 Encounter for immunization: Secondary | ICD-10-CM | POA: Diagnosis not present

## 2021-04-12 ENCOUNTER — Ambulatory Visit (INDEPENDENT_AMBULATORY_CARE_PROVIDER_SITE_OTHER): Payer: Medicare Other

## 2021-04-12 ENCOUNTER — Other Ambulatory Visit: Payer: Self-pay

## 2021-04-12 DIAGNOSIS — E538 Deficiency of other specified B group vitamins: Secondary | ICD-10-CM

## 2021-04-12 MED ORDER — CYANOCOBALAMIN 1000 MCG/ML IJ SOLN
1000.0000 ug | Freq: Once | INTRAMUSCULAR | Status: AC
  Administered 2021-04-12: 1000 ug via INTRAMUSCULAR

## 2021-05-10 ENCOUNTER — Ambulatory Visit (INDEPENDENT_AMBULATORY_CARE_PROVIDER_SITE_OTHER): Payer: Medicare Other

## 2021-05-10 ENCOUNTER — Other Ambulatory Visit: Payer: Self-pay

## 2021-05-10 DIAGNOSIS — E538 Deficiency of other specified B group vitamins: Secondary | ICD-10-CM

## 2021-05-10 MED ORDER — CYANOCOBALAMIN 1000 MCG/ML IJ SOLN
1000.0000 ug | Freq: Once | INTRAMUSCULAR | Status: AC
  Administered 2021-05-10: 1000 ug via INTRAMUSCULAR

## 2021-05-15 DIAGNOSIS — M19011 Primary osteoarthritis, right shoulder: Secondary | ICD-10-CM | POA: Diagnosis not present

## 2021-05-15 DIAGNOSIS — M75111 Incomplete rotator cuff tear or rupture of right shoulder, not specified as traumatic: Secondary | ICD-10-CM | POA: Diagnosis not present

## 2021-05-15 DIAGNOSIS — M25511 Pain in right shoulder: Secondary | ICD-10-CM | POA: Diagnosis not present

## 2021-05-17 DIAGNOSIS — L4050 Arthropathic psoriasis, unspecified: Secondary | ICD-10-CM | POA: Diagnosis not present

## 2021-06-01 DIAGNOSIS — Z888 Allergy status to other drugs, medicaments and biological substances status: Secondary | ICD-10-CM | POA: Diagnosis not present

## 2021-06-01 DIAGNOSIS — M19011 Primary osteoarthritis, right shoulder: Secondary | ICD-10-CM | POA: Diagnosis not present

## 2021-06-01 DIAGNOSIS — M7551 Bursitis of right shoulder: Secondary | ICD-10-CM | POA: Diagnosis not present

## 2021-06-01 DIAGNOSIS — M25511 Pain in right shoulder: Secondary | ICD-10-CM | POA: Diagnosis not present

## 2021-06-01 DIAGNOSIS — M7541 Impingement syndrome of right shoulder: Secondary | ICD-10-CM | POA: Diagnosis not present

## 2021-06-01 DIAGNOSIS — Z79899 Other long term (current) drug therapy: Secondary | ICD-10-CM | POA: Diagnosis not present

## 2021-06-01 DIAGNOSIS — M75101 Unspecified rotator cuff tear or rupture of right shoulder, not specified as traumatic: Secondary | ICD-10-CM | POA: Diagnosis not present

## 2021-06-01 DIAGNOSIS — E785 Hyperlipidemia, unspecified: Secondary | ICD-10-CM | POA: Diagnosis not present

## 2021-06-01 DIAGNOSIS — M797 Fibromyalgia: Secondary | ICD-10-CM | POA: Diagnosis not present

## 2021-06-01 DIAGNOSIS — Z885 Allergy status to narcotic agent status: Secondary | ICD-10-CM | POA: Diagnosis not present

## 2021-06-01 DIAGNOSIS — M7521 Bicipital tendinitis, right shoulder: Secondary | ICD-10-CM | POA: Diagnosis not present

## 2021-06-01 DIAGNOSIS — L4050 Arthropathic psoriasis, unspecified: Secondary | ICD-10-CM | POA: Diagnosis not present

## 2021-06-01 DIAGNOSIS — G8918 Other acute postprocedural pain: Secondary | ICD-10-CM | POA: Diagnosis not present

## 2021-06-01 DIAGNOSIS — M75121 Complete rotator cuff tear or rupture of right shoulder, not specified as traumatic: Secondary | ICD-10-CM | POA: Diagnosis not present

## 2021-06-01 DIAGNOSIS — M7501 Adhesive capsulitis of right shoulder: Secondary | ICD-10-CM | POA: Diagnosis not present

## 2021-06-01 DIAGNOSIS — I10 Essential (primary) hypertension: Secondary | ICD-10-CM | POA: Diagnosis not present

## 2021-06-01 DIAGNOSIS — M75111 Incomplete rotator cuff tear or rupture of right shoulder, not specified as traumatic: Secondary | ICD-10-CM | POA: Diagnosis not present

## 2021-06-07 ENCOUNTER — Ambulatory Visit: Payer: Self-pay

## 2021-06-12 DIAGNOSIS — Z111 Encounter for screening for respiratory tuberculosis: Secondary | ICD-10-CM | POA: Diagnosis not present

## 2021-06-12 DIAGNOSIS — Z79899 Other long term (current) drug therapy: Secondary | ICD-10-CM | POA: Diagnosis not present

## 2021-06-12 DIAGNOSIS — L4050 Arthropathic psoriasis, unspecified: Secondary | ICD-10-CM | POA: Diagnosis not present

## 2021-06-12 DIAGNOSIS — M539 Dorsopathy, unspecified: Secondary | ICD-10-CM | POA: Diagnosis not present

## 2021-06-12 DIAGNOSIS — L409 Psoriasis, unspecified: Secondary | ICD-10-CM | POA: Diagnosis not present

## 2021-06-14 ENCOUNTER — Other Ambulatory Visit: Payer: Self-pay

## 2021-06-14 ENCOUNTER — Ambulatory Visit (INDEPENDENT_AMBULATORY_CARE_PROVIDER_SITE_OTHER): Payer: Medicare Other

## 2021-06-14 DIAGNOSIS — E538 Deficiency of other specified B group vitamins: Secondary | ICD-10-CM | POA: Diagnosis not present

## 2021-06-14 MED ORDER — CYANOCOBALAMIN 1000 MCG/ML IJ SOLN
1000.0000 ug | Freq: Once | INTRAMUSCULAR | Status: AC
  Administered 2021-06-14: 1000 ug via INTRAMUSCULAR

## 2021-06-19 ENCOUNTER — Other Ambulatory Visit: Payer: Self-pay | Admitting: Family Medicine

## 2021-06-19 NOTE — Telephone Encounter (Signed)
Requested medications are due for refill today yes  Requested medications are on the active medication list yes  Last refill 1/29  Last visit 01/2021  Future visit scheduled 08/22/21  Notes to clinic Not Delegated.

## 2021-06-20 ENCOUNTER — Telehealth: Payer: Self-pay

## 2021-06-20 MED ORDER — GABAPENTIN 400 MG PO CAPS: ORAL_CAPSULE | ORAL | 0 refills | Status: DC

## 2021-06-20 NOTE — Telephone Encounter (Signed)
[facility] [place] faxed refill request for the following medications:  gabapentin (NEURONTIN) 400 MG capsule   Please advise.

## 2021-06-20 NOTE — Telephone Encounter (Signed)
Medication refilled

## 2021-06-28 DIAGNOSIS — M25559 Pain in unspecified hip: Secondary | ICD-10-CM | POA: Diagnosis not present

## 2021-06-28 DIAGNOSIS — M79671 Pain in right foot: Secondary | ICD-10-CM | POA: Diagnosis not present

## 2021-06-28 DIAGNOSIS — R7982 Elevated C-reactive protein (CRP): Secondary | ICD-10-CM | POA: Diagnosis not present

## 2021-06-28 DIAGNOSIS — M25541 Pain in joints of right hand: Secondary | ICD-10-CM | POA: Diagnosis not present

## 2021-06-28 DIAGNOSIS — M79672 Pain in left foot: Secondary | ICD-10-CM | POA: Diagnosis not present

## 2021-06-28 DIAGNOSIS — Z79899 Other long term (current) drug therapy: Secondary | ICD-10-CM | POA: Diagnosis not present

## 2021-06-28 DIAGNOSIS — M25532 Pain in left wrist: Secondary | ICD-10-CM | POA: Diagnosis not present

## 2021-06-28 DIAGNOSIS — M25531 Pain in right wrist: Secondary | ICD-10-CM | POA: Diagnosis not present

## 2021-06-28 DIAGNOSIS — M25542 Pain in joints of left hand: Secondary | ICD-10-CM | POA: Diagnosis not present

## 2021-07-12 DIAGNOSIS — L4050 Arthropathic psoriasis, unspecified: Secondary | ICD-10-CM | POA: Diagnosis not present

## 2021-07-19 ENCOUNTER — Other Ambulatory Visit: Payer: Self-pay

## 2021-07-19 ENCOUNTER — Ambulatory Visit (INDEPENDENT_AMBULATORY_CARE_PROVIDER_SITE_OTHER): Payer: Medicare Other

## 2021-07-19 DIAGNOSIS — M25511 Pain in right shoulder: Secondary | ICD-10-CM | POA: Diagnosis not present

## 2021-07-19 DIAGNOSIS — E538 Deficiency of other specified B group vitamins: Secondary | ICD-10-CM

## 2021-07-19 DIAGNOSIS — Z9889 Other specified postprocedural states: Secondary | ICD-10-CM | POA: Diagnosis not present

## 2021-07-19 MED ORDER — CYANOCOBALAMIN 1000 MCG/ML IJ SOLN
1000.0000 ug | Freq: Once | INTRAMUSCULAR | Status: AC
  Administered 2021-07-19: 1000 ug via INTRAMUSCULAR

## 2021-07-24 ENCOUNTER — Telehealth: Payer: Self-pay

## 2021-07-24 NOTE — Telephone Encounter (Signed)
Copied from [place] [id number]. Topic: Appointment Scheduling - Scheduling Inquiry for Clinic >> Jul 24, 2021 11:29 AM Valere Dross wrote: Reason for CRM: Pt called in wanting to reschedule her nurse visit on 08/31, please advise.

## 2021-07-24 NOTE — Telephone Encounter (Signed)
Left vm for pt to call back to r/s.  If she calls back please r/s.

## 2021-07-25 DIAGNOSIS — M25511 Pain in right shoulder: Secondary | ICD-10-CM | POA: Diagnosis not present

## 2021-07-25 DIAGNOSIS — Z9889 Other specified postprocedural states: Secondary | ICD-10-CM | POA: Diagnosis not present

## 2021-07-27 DIAGNOSIS — Z9889 Other specified postprocedural states: Secondary | ICD-10-CM | POA: Diagnosis not present

## 2021-07-27 DIAGNOSIS — M25511 Pain in right shoulder: Secondary | ICD-10-CM | POA: Diagnosis not present

## 2021-08-01 DIAGNOSIS — Z9889 Other specified postprocedural states: Secondary | ICD-10-CM | POA: Diagnosis not present

## 2021-08-01 DIAGNOSIS — M25511 Pain in right shoulder: Secondary | ICD-10-CM | POA: Diagnosis not present

## 2021-08-03 DIAGNOSIS — Z9889 Other specified postprocedural states: Secondary | ICD-10-CM | POA: Diagnosis not present

## 2021-08-03 DIAGNOSIS — M25511 Pain in right shoulder: Secondary | ICD-10-CM | POA: Diagnosis not present

## 2021-08-11 DIAGNOSIS — Z9889 Other specified postprocedural states: Secondary | ICD-10-CM | POA: Diagnosis not present

## 2021-08-11 DIAGNOSIS — M25511 Pain in right shoulder: Secondary | ICD-10-CM | POA: Diagnosis not present

## 2021-08-16 ENCOUNTER — Ambulatory Visit: Payer: Self-pay

## 2021-08-16 DIAGNOSIS — Z96641 Presence of right artificial hip joint: Secondary | ICD-10-CM | POA: Diagnosis not present

## 2021-08-16 DIAGNOSIS — M19011 Primary osteoarthritis, right shoulder: Secondary | ICD-10-CM | POA: Diagnosis not present

## 2021-08-16 DIAGNOSIS — M7501 Adhesive capsulitis of right shoulder: Secondary | ICD-10-CM | POA: Diagnosis not present

## 2021-08-16 DIAGNOSIS — M75121 Complete rotator cuff tear or rupture of right shoulder, not specified as traumatic: Secondary | ICD-10-CM | POA: Diagnosis not present

## 2021-08-16 DIAGNOSIS — M25611 Stiffness of right shoulder, not elsewhere classified: Secondary | ICD-10-CM | POA: Diagnosis not present

## 2021-08-22 ENCOUNTER — Ambulatory Visit: Payer: Self-pay | Admitting: Family Medicine

## 2021-08-23 ENCOUNTER — Ambulatory Visit: Payer: Medicare Other

## 2021-08-24 DIAGNOSIS — M25511 Pain in right shoulder: Secondary | ICD-10-CM | POA: Diagnosis not present

## 2021-08-24 DIAGNOSIS — Z9889 Other specified postprocedural states: Secondary | ICD-10-CM | POA: Diagnosis not present

## 2021-08-30 ENCOUNTER — Ambulatory Visit (INDEPENDENT_AMBULATORY_CARE_PROVIDER_SITE_OTHER): Payer: Medicare Other

## 2021-08-30 ENCOUNTER — Other Ambulatory Visit: Payer: Self-pay

## 2021-08-30 DIAGNOSIS — E538 Deficiency of other specified B group vitamins: Secondary | ICD-10-CM | POA: Diagnosis not present

## 2021-08-30 MED ORDER — CYANOCOBALAMIN 1000 MCG/ML IJ SOLN
1000.0000 ug | Freq: Once | INTRAMUSCULAR | Status: AC
  Administered 2021-08-30: 1000 ug via INTRAMUSCULAR

## 2021-09-04 ENCOUNTER — Other Ambulatory Visit: Payer: Self-pay | Admitting: Family Medicine

## 2021-09-04 DIAGNOSIS — M25511 Pain in right shoulder: Secondary | ICD-10-CM | POA: Diagnosis not present

## 2021-09-04 NOTE — Telephone Encounter (Signed)
Requested medication (s) are due for refill today: yes  Requested medication (s) are on the active medication list: yes   Last refill: 11/08/20  #180  2 refills  Future visit scheduled yes  Notes to clinic:  Not delegated  Requested Prescriptions  Pending Prescriptions Disp Refills   tiZANidine (ZANAFLEX) 4 MG tablet [Pharmacy Med Name: TIZANIDINE TAB '4MG'$ ] 180 tablet 2    Sig: TAKE 1 TABLET TWICE A DAY     Not Delegated - Cardiovascular:  Alpha-2 Agonists - tizanidine Failed - 09/04/2021  8:11 AM      Failed - This refill cannot be delegated      Failed - Valid encounter within last 6 months    Recent Outpatient Visits           6 months ago Aortic atherosclerosis [facility])   [facility] [place], [name], MD   10 months ago Psychophysiological insomnia   [facility] [place], [name], MD   1 year ago Fever, unspecified fever cause   [facility], [name], MD   1 year ago Essential hypertension   [place], [name], MD   1 year ago Essential hypertension   [place], [name], MD       Future Appointments             In 1 month [place]ig[place], [name], MD [facility]            Signed Prescriptions Disp Refills   estradiol (ESTRACE) 2 MG tablet 90 tablet 0    Sig: TAKE 1 TABLET DAILY     OB/GYN:  Estrogens Failed - 09/04/2021  8:11 AM      Failed - Last BP in normal range    BP Readings from Last 1 Encounters:  02/09/21 (!) 147/84          Passed - Mammogram is up-to-date per Health Maintenance      Passed - Valid encounter within last 12 months    Recent Outpatient Visits           6 months ago Aortic atherosclerosis [facility])   [facility] [place], [name], MD   10 months ago Psychophysiological insomnia   [facility] [place], [name], MD   1 year ago Fever, unspecified fever cause    [facility] [place], [name], MD   1 year ago Essential hypertension   [facility] [place], [name], MD   1 year ago Essential hypertension   [place], [name], MD       Future Appointments             In 1 month Bacigal[place], [name], MD [facility]

## 2021-09-13 DIAGNOSIS — L4050 Arthropathic psoriasis, unspecified: Secondary | ICD-10-CM | POA: Diagnosis not present

## 2021-09-14 ENCOUNTER — Other Ambulatory Visit: Payer: Self-pay | Admitting: Family Medicine

## 2021-09-20 DIAGNOSIS — M25511 Pain in right shoulder: Secondary | ICD-10-CM | POA: Diagnosis not present

## 2021-09-20 DIAGNOSIS — M79672 Pain in left foot: Secondary | ICD-10-CM | POA: Diagnosis not present

## 2021-09-20 DIAGNOSIS — M25559 Pain in unspecified hip: Secondary | ICD-10-CM | POA: Diagnosis not present

## 2021-09-20 DIAGNOSIS — M79671 Pain in right foot: Secondary | ICD-10-CM | POA: Diagnosis not present

## 2021-09-20 DIAGNOSIS — Z79899 Other long term (current) drug therapy: Secondary | ICD-10-CM | POA: Diagnosis not present

## 2021-09-20 DIAGNOSIS — M25541 Pain in joints of right hand: Secondary | ICD-10-CM | POA: Diagnosis not present

## 2021-09-20 DIAGNOSIS — M25531 Pain in right wrist: Secondary | ICD-10-CM | POA: Diagnosis not present

## 2021-09-20 DIAGNOSIS — M25532 Pain in left wrist: Secondary | ICD-10-CM | POA: Diagnosis not present

## 2021-09-20 DIAGNOSIS — R7982 Elevated C-reactive protein (CRP): Secondary | ICD-10-CM | POA: Diagnosis not present

## 2021-09-20 DIAGNOSIS — Z9889 Other specified postprocedural states: Secondary | ICD-10-CM | POA: Diagnosis not present

## 2021-09-20 DIAGNOSIS — M25542 Pain in joints of left hand: Secondary | ICD-10-CM | POA: Diagnosis not present

## 2021-09-22 IMAGING — MG DIGITAL SCREENING BILAT W/ TOMO W/ CAD
8 series · 8 of 24 positions shown · non-contrast
Comparison: Previous exam(s).

CLINICAL DATA: Screening.

EXAM:
DIGITAL SCREENING BILATERAL MAMMOGRAM WITH TOMO AND CAD

[L CC synth-2D]
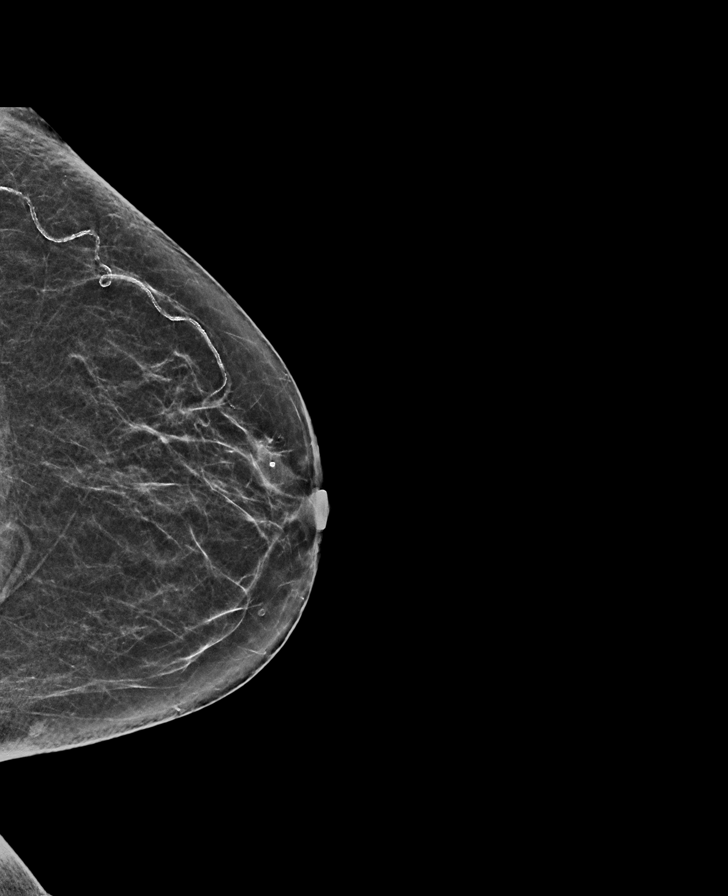

[R MLO synth-2D]
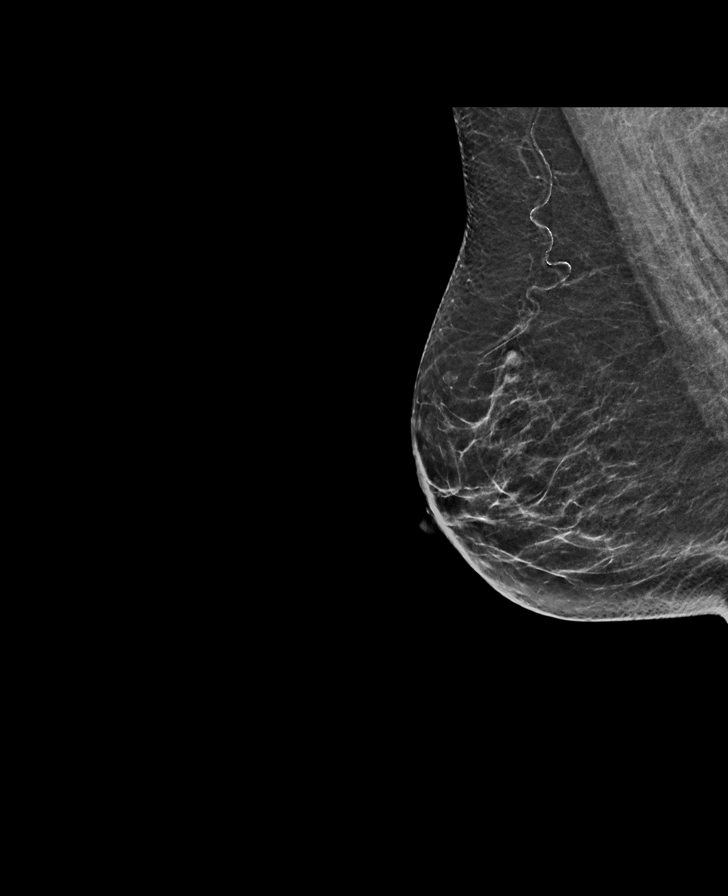

[R CC synth-2D]
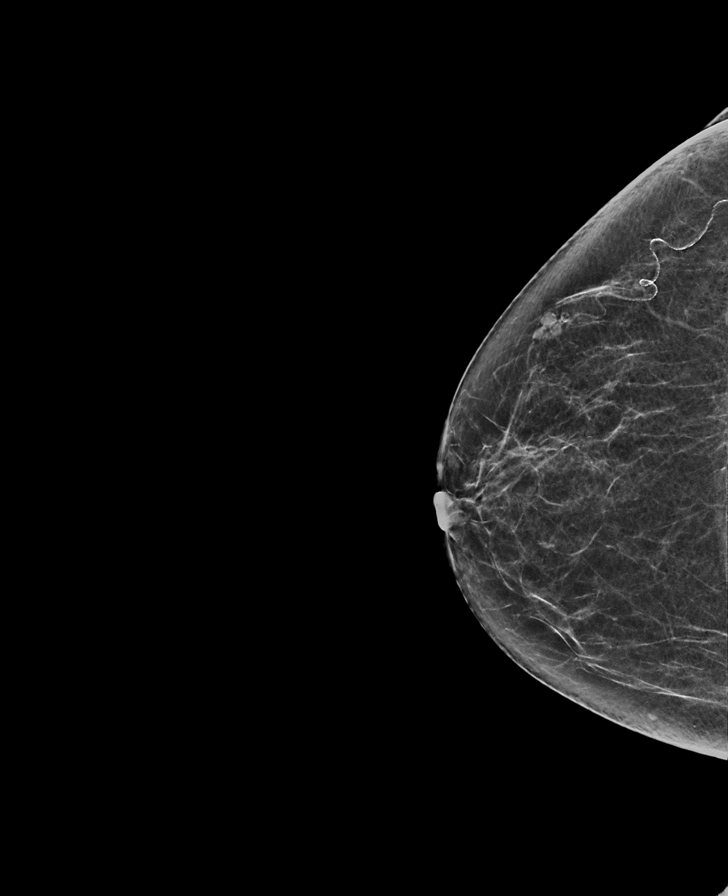

[L MLO synth-2D]
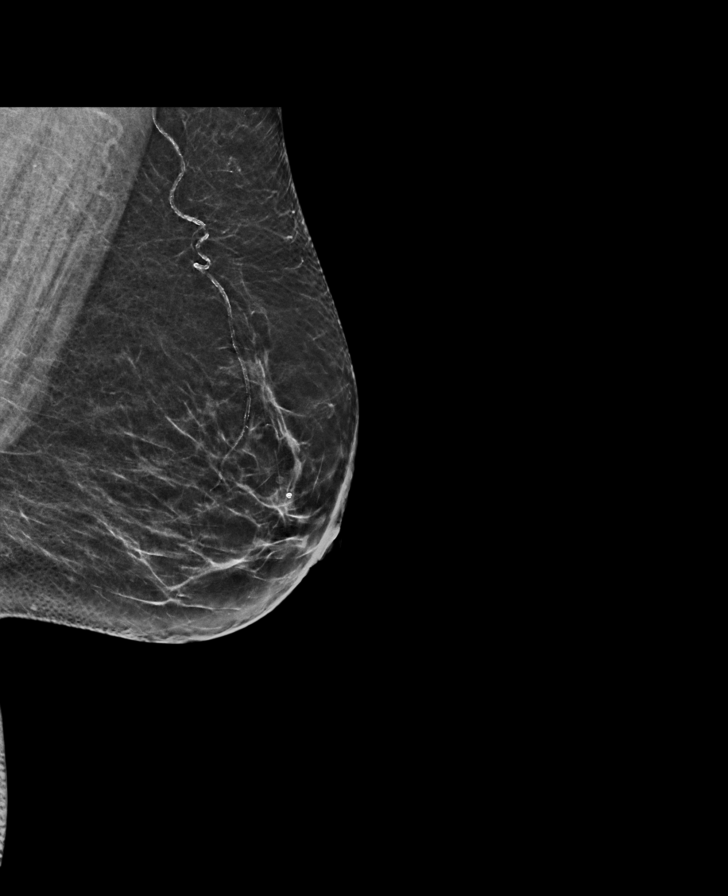

[L MLO tomo · tomo slice 31/62.0]
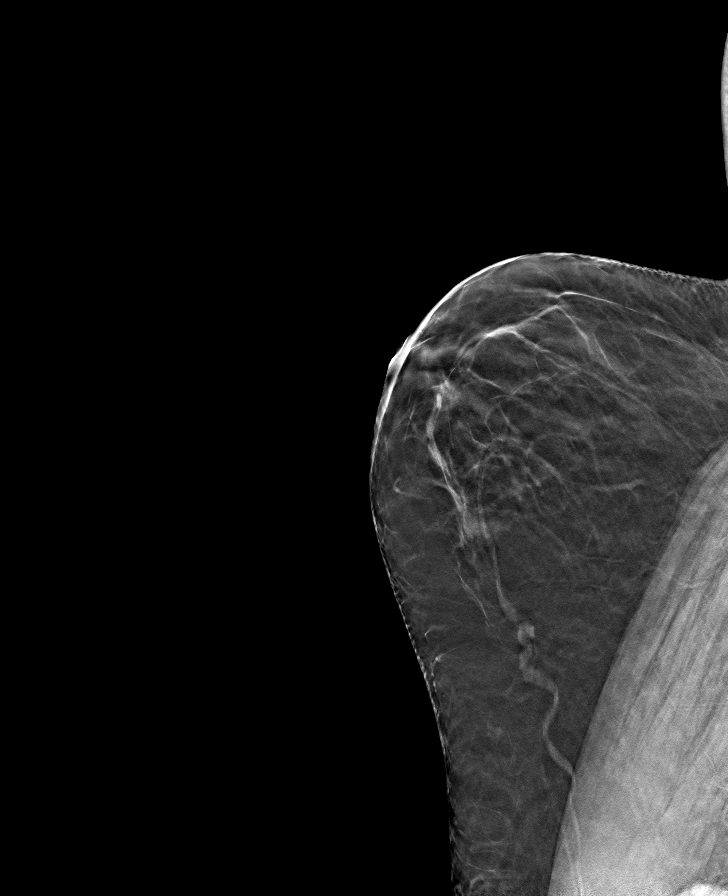

[R CC tomo · tomo slice 29/57.0]
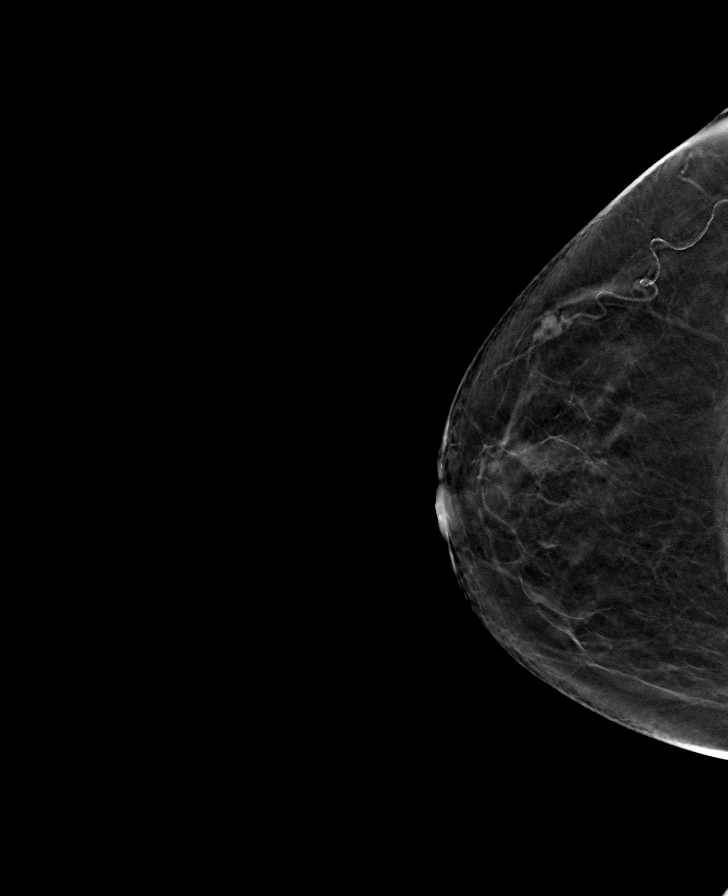

[R MLO tomo · tomo slice 30/59.0]
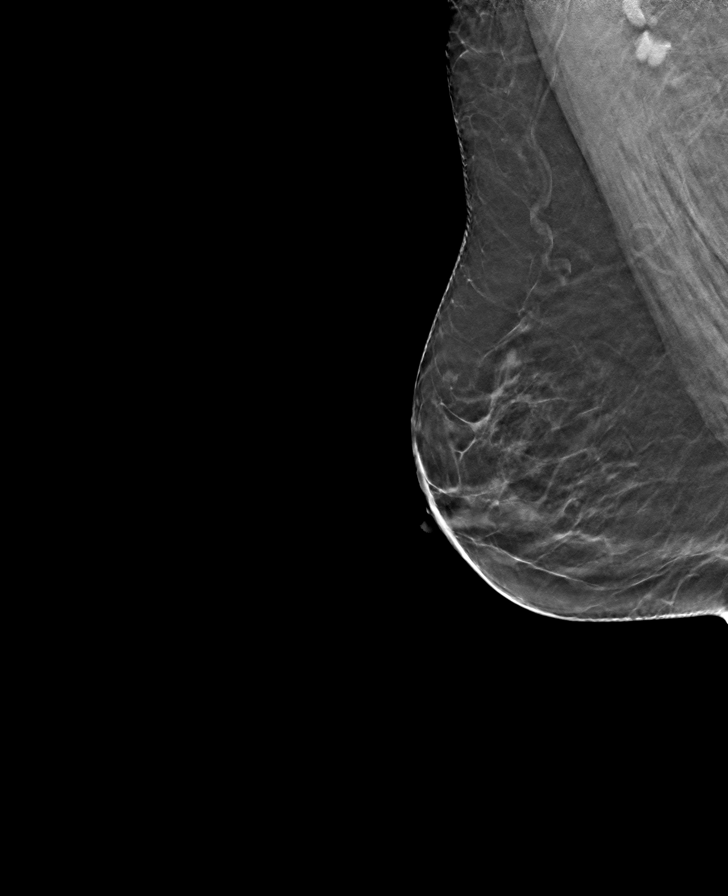

[L CC tomo · tomo slice 28/55.0]
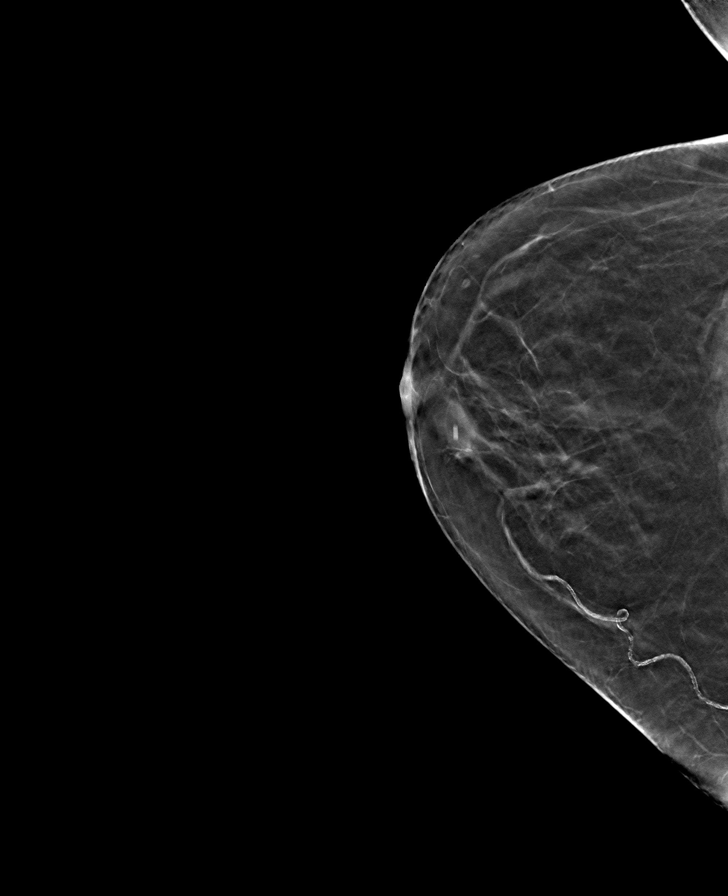

[8 of 24 positions shown; findings below may reference images not displayed]

ACR Breast Density Category b: There are scattered areas of
fibroglandular density.
FINDINGS: There are no findings suspicious for malignancy. Images were
processed with CAD.
IMPRESSION: No mammographic evidence of malignancy. A result letter of this
screening mammogram will be mailed directly to the patient.

RECOMMENDATION:
Screening mammogram in one year. (Code:[id number])

BI-RADS CATEGORY  1: Negative.

## 2021-09-27 ENCOUNTER — Ambulatory Visit: Payer: Medicare Other

## 2021-10-05 ENCOUNTER — Ambulatory Visit: Payer: Medicare Other | Admitting: Family Medicine

## 2021-10-11 ENCOUNTER — Ambulatory Visit (INDEPENDENT_AMBULATORY_CARE_PROVIDER_SITE_OTHER): Payer: Medicare Other

## 2021-10-11 ENCOUNTER — Other Ambulatory Visit: Payer: Self-pay

## 2021-10-11 DIAGNOSIS — E538 Deficiency of other specified B group vitamins: Secondary | ICD-10-CM

## 2021-10-11 MED ORDER — CYANOCOBALAMIN 1000 MCG/ML IJ SOLN
1000.0000 ug | Freq: Once | INTRAMUSCULAR | Status: AC
  Administered 2021-10-11: 1000 ug via INTRAMUSCULAR

## 2021-10-18 DIAGNOSIS — L409 Psoriasis, unspecified: Secondary | ICD-10-CM | POA: Diagnosis not present

## 2021-10-18 DIAGNOSIS — L4050 Arthropathic psoriasis, unspecified: Secondary | ICD-10-CM | POA: Diagnosis not present

## 2021-10-18 DIAGNOSIS — M539 Dorsopathy, unspecified: Secondary | ICD-10-CM | POA: Diagnosis not present

## 2021-10-18 DIAGNOSIS — Z7960 Long term (current) use of unspecified immunomodulators and immunosuppressants: Secondary | ICD-10-CM | POA: Diagnosis not present

## 2021-10-19 DIAGNOSIS — Z23 Encounter for immunization: Secondary | ICD-10-CM | POA: Diagnosis not present

## 2021-10-20 ENCOUNTER — Other Ambulatory Visit: Payer: Self-pay

## 2021-10-20 ENCOUNTER — Encounter: Payer: Self-pay | Admitting: Family Medicine

## 2021-10-20 ENCOUNTER — Ambulatory Visit (INDEPENDENT_AMBULATORY_CARE_PROVIDER_SITE_OTHER): Payer: Medicare Other | Admitting: Family Medicine

## 2021-10-20 VITALS — BP 106/60 | HR 82 | Temp 97.6°F | Resp 16 | Ht 68.0 in | Wt 185.6 lb

## 2021-10-20 DIAGNOSIS — R7303 Prediabetes: Secondary | ICD-10-CM | POA: Diagnosis not present

## 2021-10-20 DIAGNOSIS — Z800 Family history of malignant neoplasm of digestive organs: Secondary | ICD-10-CM | POA: Diagnosis not present

## 2021-10-20 DIAGNOSIS — Z0000 Encounter for general adult medical examination without abnormal findings: Secondary | ICD-10-CM | POA: Diagnosis not present

## 2021-10-20 DIAGNOSIS — D692 Other nonthrombocytopenic purpura: Secondary | ICD-10-CM

## 2021-10-20 DIAGNOSIS — L4050 Arthropathic psoriasis, unspecified: Secondary | ICD-10-CM | POA: Diagnosis not present

## 2021-10-20 DIAGNOSIS — Z789 Other specified health status: Secondary | ICD-10-CM | POA: Diagnosis not present

## 2021-10-20 DIAGNOSIS — F5104 Psychophysiologic insomnia: Secondary | ICD-10-CM | POA: Diagnosis not present

## 2021-10-20 DIAGNOSIS — E782 Mixed hyperlipidemia: Secondary | ICD-10-CM | POA: Diagnosis not present

## 2021-10-20 DIAGNOSIS — I10 Essential (primary) hypertension: Secondary | ICD-10-CM

## 2021-10-20 DIAGNOSIS — D510 Vitamin B12 deficiency anemia due to intrinsic factor deficiency: Secondary | ICD-10-CM | POA: Diagnosis not present

## 2021-10-20 DIAGNOSIS — I700 Atherosclerosis of aorta: Secondary | ICD-10-CM

## 2021-10-20 DIAGNOSIS — G43109 Migraine with aura, not intractable, without status migrainosus: Secondary | ICD-10-CM

## 2021-10-20 DIAGNOSIS — E538 Deficiency of other specified B group vitamins: Secondary | ICD-10-CM | POA: Diagnosis not present

## 2021-10-20 DIAGNOSIS — Z1231 Encounter for screening mammogram for malignant neoplasm of breast: Secondary | ICD-10-CM

## 2021-10-20 DIAGNOSIS — E663 Overweight: Secondary | ICD-10-CM | POA: Diagnosis not present

## 2021-10-20 NOTE — Assessment & Plan Note (Addendum)
Stable, managed by Rheumatology Continue methotrexate 2.5mg  5 tablets weekly Continue folic acid

## 2021-10-20 NOTE — Assessment & Plan Note (Signed)
Discussed continuing lifestyle changes Continue exercise when cleared by ortho

## 2021-10-20 NOTE — Assessment & Plan Note (Addendum)
Improved Continue nortriptyline 50mg  qhs

## 2021-10-20 NOTE — Assessment & Plan Note (Signed)
Continue lifestyle management Statin intolerant Check lipid panel

## 2021-10-20 NOTE — Assessment & Plan Note (Signed)
Continue monthly B12 injection Check B12

## 2021-10-20 NOTE — Assessment & Plan Note (Signed)
Referral for colonoscopy °

## 2021-10-20 NOTE — Assessment & Plan Note (Addendum)
BP 106/60 in office Continue exercise when cleared by ortho Continue Zestoretic 10-12.5 mg daily

## 2021-10-20 NOTE — Assessment & Plan Note (Signed)
Continue low carb diet Continue exercise

## 2021-10-20 NOTE — Assessment & Plan Note (Signed)
Stable Continue nortriptyline

## 2021-10-20 NOTE — Assessment & Plan Note (Signed)
Statin intolerant - pancreatitis

## 2021-10-20 NOTE — Assessment & Plan Note (Addendum)
Check B12 

## 2021-10-20 NOTE — Assessment & Plan Note (Signed)
Continue to monitor Encourage exercise Statin intolerant

## 2021-10-20 NOTE — Progress Notes (Signed)
Annual Wellness Visit     Patient: Shannon Grimes, Female    DOB: 02-02-1951, 70 y.o.   MRN: [id number] Visit Date: 10/20/2021  Today's Provider: [name], MD   Chief Complaint  Patient presents with   Medicare Wellness   Subjective    Shannon Grimes is a 70 y.o. female who presents today for her Annual Wellness Visit. She reports consuming a general diet. Gym/ health club routine includes swimming. Exercise is limited by orthopedic condition(s): rotator cuff surgery. She generally feels fairly well. She reports sleeping poorly. She has an upcoming appointment with Ortho and does not have additional problems to discuss today.   Medications: Outpatient Medications Prior to Visit  Medication Sig   betamethasone dipropionate 0.05 % cream Apply topically 2 (two) times daily.   celecoxib (CELEBREX) 200 MG capsule Take 200 mg by mouth daily as needed.   cyanocobalamin (,VITAMIN B-12,) 1000 MCG/ML injection INJECT 1 ML AS DIRECTED EVERY 30 (THIRTY) DAYS.   estradiol (ESTRACE) 2 MG tablet TAKE 1 TABLET DAILY   folic acid (FOLVITE) 1 MG tablet folic acid 1 mg tablet  TK 1 T PO QD   gabapentin (NEURONTIN) 400 MG capsule TAKE 1 CAPSULE BY MOUTH THREE TIMES A DAY   HYDROcodone Bitartrate ER 20 MG T24A Take 1 tablet by mouth daily.    ibuprofen (ADVIL) 200 MG tablet Take 200 mg by mouth every 6 (six) hours as needed.   inFLIXimab in sodium chloride 0.9 % Inject into the vein.   lisinopril-hydrochlorothiazide (ZESTORETIC) 10-12.5 MG tablet Take 1 tablet by mouth daily.   lubiprostone (AMITIZA) 24 MCG capsule Take 1 capsule (24 mcg total) by mouth 2 (two) times daily with a meal.   methotrexate (RHEUMATREX) 2.5 MG tablet 2.5 mg. 5 tablets weekly   nortriptyline (PAMELOR) 50 MG capsule Take 1 capsule (50 mg total) by mouth at bedtime.   promethazine (PHENERGAN) 25 MG tablet TAKE 1 TABLET EVERY 4 HOURSAS NEEDED   SUMAtriptan (IMITREX) 100 MG tablet TAKE 1 TABLET DAILY AS      NEEDED. MAY REPEAT IN 2    HOURS IF HEADACHE PERSISTS OR RECURS   tiZANidine (ZANAFLEX) 4 MG tablet TAKE 1 TABLET TWICE A DAY   Facility-Administered Medications Prior to Visit  Medication Dose Route Frequency Provider   cyanocobalamin ((VITAMIN B-12)) injection 1,000 mcg  1,000 mcg Intramuscular Q30 days Keithon Mccoin, [name], MD    Allergies  Allergen Reactions   Pentazocine Anaphylaxis   Statins Other (See Comments)    pancreatitis   Morphine Nausea And Vomiting    And migraine    Patient Care Team: Virginia Crews, MD as PCP - General (Family Medicine) Nadene Rubins, DO as Referring Physician (Physical Medicine and Rehabilitation) Jonathon Bellows, MD as Consulting Physician (Gastroenterology) Berline Chough, MD as Referring Physician (Rheumatology) Rutherford, Alfonso Patten., MD as Referring Physician (Orthopedic Surgery) Eulogio Bear, MD as Consulting Physician (Ophthalmology)  Review of Systems  Constitutional:  Positive for fatigue.  HENT:  Positive for rhinorrhea.   Eyes: Negative.   Respiratory: Negative.    Cardiovascular: Negative.   Gastrointestinal:  Positive for constipation and diarrhea.  Endocrine: Negative.   Genitourinary: Negative.   Musculoskeletal:  Positive for arthralgias, back pain, gait problem, joint swelling and myalgias.  Skin: Negative.   Allergic/Immunologic: Negative.   Neurological:  Positive for headaches.  Hematological: Negative.   Psychiatric/Behavioral: Negative.         Objective    Vitals: BP  106/60 (BP Location: Left Arm, Patient Position: Sitting, Cuff Size: Large)   Pulse 82   Temp 97.6 F (36.4 C) (Temporal)   Resp 16   Ht $R'5\' 8"'CI$  (1.727 m)   Wt 185 lb 9.6 oz (84.2 kg)   SpO2 99%   BMI 28.22 kg/m    Physical Exam Vitals reviewed.  Constitutional:      General: She is not in acute distress.    Appearance: Normal appearance. She is not ill-appearing or toxic-appearing.  HENT:     Head: Normocephalic and  atraumatic.     Right Ear: External ear normal.     Left Ear: External ear normal.     Nose: Nose normal.     Mouth/Throat:     Mouth: Mucous membranes are moist.     Pharynx: Oropharynx is clear. No oropharyngeal exudate or posterior oropharyngeal erythema.  Eyes:     General: No scleral icterus.    Extraocular Movements: Extraocular movements intact.     Conjunctiva/sclera: Conjunctivae normal.     Pupils: Pupils are equal, round, and reactive to light.  Cardiovascular:     Rate and Rhythm: Normal rate and regular rhythm.     Pulses: Normal pulses.     Heart sounds: Normal heart sounds. No murmur heard.   No friction rub. No gallop.  Pulmonary:     Effort: Pulmonary effort is normal. No respiratory distress.     Breath sounds: Normal breath sounds. No wheezing or rhonchi.  Abdominal:     General: Abdomen is flat. There is no distension.     Palpations: Abdomen is soft.     Tenderness: There is no abdominal tenderness.  Musculoskeletal:        General: Swelling present.     Cervical back: Normal range of motion and neck supple.     Comments: Right proximal upper extremity swelling due to rotator cuff injury  Skin:    General: Skin is warm and dry.     Capillary Refill: Capillary refill takes less than 2 seconds.     Findings: No lesion or rash.  Neurological:     General: No focal deficit present.     Mental Status: She is alert and oriented to person, place, and time. Mental status is at baseline.  Psychiatric:        Mood and Affect: Mood normal.    Most recent functional status assessment: In your present state of health, do you have any difficulty performing the following activities: 10/20/2021  Hearing? N  Vision? N  Difficulty concentrating or making decisions? N  Walking or climbing stairs? Y  Dressing or bathing? N  Doing errands, shopping? N  Some recent data might be hidden   Most recent fall risk assessment: Fall Risk  10/20/2021  Falls in the past year?  0  Number falls in past yr: 0  Injury with Fall? 0  Risk for fall due to : No Fall Risks  Follow up Falls evaluation completed    Most recent depression screenings: PHQ 2/9 Scores 10/20/2021 02/09/2021  PHQ - 2 Score 0 0  PHQ- 9 Score 0 1   Most recent cognitive screening: No flowsheet data found. Most recent Audit-C alcohol use screening Alcohol Use Disorder Test (AUDIT) 10/20/2021  1. How often do you have a drink containing alcohol? 0  2. How many drinks containing alcohol do you have on a typical day when you are drinking? 0  3. How often do you have six or  more drinks on one occasion? 0  AUDIT-C Score 0  Alcohol Brief Interventions/Follow-up -   A score of 3 or more in women, and 4 or more in men indicates increased risk for alcohol abuse, EXCEPT if all of the points are from question 1   No results found for any visits on 10/20/21.  Assessment & Plan     Annual wellness visit done today including the all of the following: Reviewed patient's Family Medical History Reviewed and updated list of patient's medical providers Assessment of cognitive impairment was done Assessed patient's functional ability Established a written schedule for health screening [place] Completed and Reviewed  Exercise Activities and Dietary recommendations  Goals      DIET - REDUCE SUGAR INTAKE     Recommend cutting back on desserts and sugars in daily diet. Pt is cutting back to eating 3 sweets a week.      LIFESTYLE - DECREASE FALLS RISK     Recommend to remove any items from the home that may cause slips or trips.        Immunization History  Administered Date(s) Administered   Fluad Quad(high Dose 65+) 10/21/2019, 11/08/2020, 10/19/2021   Influenza, High Dose Seasonal PF 10/24/2017, 10/28/2018   PFIZER(Purple Top)SARS-COV-2 Vaccination 04/01/2020, 04/22/2020, 10/24/2020   Pfizer Covid-19 Vaccine Bivalent Booster 45yrs & up 04/07/2021, 10/19/2021    Pneumococcal Conjugate-13 07/06/2016   Pneumococcal Polysaccharide-23 11/19/2017   Zoster Recombinat (Shingrix) 02/10/2020   Zoster, Live 11/21/2011    Health Maintenance  Topic Date Due   Zoster Vaccines- Shingrix (2 of 2) 04/06/2020   COLONOSCOPY (Pts 45-90yrs Insurance coverage will need to be confirmed)  09/27/2020   TETANUS/TDAP  05/10/2024 (Originally 03/24/1970)   MAMMOGRAM  10/20/2022   DEXA SCAN  03/15/2026   Pneumonia Vaccine 63+ Years old  Completed   INFLUENZA VACCINE  Completed   COVID-19 Vaccine  Completed   Hepatitis C Screening  Completed   HPV VACCINES  Aged Out     Discussed health benefits of physical activity, and encouraged her to engage in regular exercise appropriate for her age and condition.    Problem List Items Addressed This Visit       Cardiovascular and Mediastinum   Migraines    Improved Continue nortriptyline 50mg  qhs      Hypertension    BP 106/60 in office Continue exercise when cleared by ortho Continue Zestoretic 10-12.5 mg daily      Relevant Orders   Comprehensive metabolic panel   Aortic atherosclerosis (HCC)    Continue to monitor Encourage exercise Statin intolerant      Senile purpura ([place])    New onset        Musculoskeletal and Integument   Psoriatic arthritis (HCC)    Stable, managed by Rheumatology Continue methotrexate 2.5mg  5 tablets weekly Continue folic acid      Relevant Orders   Sed Rate (ESR)   CBC w/Diff/Platelet     Other   Hyperlipidemia    Continue lifestyle management Statin intolerant Check lipid panel       Relevant Orders   Comprehensive metabolic panel   Lipid panel   Overweight    Discussed continuing lifestyle changes Continue exercise when cleared by ortho      Pernicious anemia    Check B12      Relevant Orders   B12   Vitamin B12 deficiency    Continue monthly B12 injection Check B12      Relevant Orders  B12   Prediabetes    Continue low carb diet Continue  exercise      Relevant Orders   Hemoglobin A1c   Statin intolerance    Statin intolerant - pancreatitis      Psychophysiological insomnia    Stable Continue nortriptyline      Family history of colon cancer    Referral for colonoscopy      Other Visit Diagnoses     Encounter for annual wellness visit (AWV) in Medicare patient    -  Primary   Screening mammogram for breast cancer       Relevant Orders   MM 3D SCREEN BREAST BILATERAL        Return in about 6 months (around 04/20/2022) for chronic disease f/u.     Nelva Nay, Medical Student 10/20/2021  Patient seen along with MS3 student Nelva Nay. I personally evaluated this patient along with the student, and verified all aspects of the history, physical exam, and medical decision making as documented by the student. I agree with the student's documentation and have made all necessary edits.  Donavon Kimrey, [name], MD, MPH [facility]

## 2021-10-20 NOTE — Assessment & Plan Note (Signed)
- 

## 2021-10-21 LAB — HEMOGLOBIN A1C
Est. average glucose Bld gHb Est-mCnc: 117 mg/dL
Hgb A1c MFr Bld: 5.7 % — ABNORMAL HIGH (ref 4.8–5.6)

## 2021-10-21 LAB — CBC WITH DIFFERENTIAL/PLATELET
Basophils Absolute: 0 10*3/uL (ref 0.0–0.2)
Basos: 1 %
EOS (ABSOLUTE): 0.1 10*3/uL (ref 0.0–0.4)
Eos: 2 %
Hematocrit: 42.4 % (ref 34.0–46.6)
Hemoglobin: 13.9 g/dL (ref 11.1–15.9)
Immature Grans (Abs): 0 10*3/uL (ref 0.0–0.1)
Immature Granulocytes: 0 %
Lymphocytes Absolute: 2 10*3/uL (ref 0.7–3.1)
Lymphs: 28 %
MCH: 29.6 pg (ref 26.6–33.0)
MCHC: 32.8 g/dL (ref 31.5–35.7)
MCV: 90 fL (ref 79–97)
Monocytes Absolute: 0.6 10*3/uL (ref 0.1–0.9)
Monocytes: 8 %
Neutrophils Absolute: 4.4 10*3/uL (ref 1.4–7.0)
Neutrophils: 61 %
Platelets: 207 10*3/uL (ref 150–450)
RBC: 4.69 x10E6/uL (ref 3.77–5.28)
RDW: 14.4 % (ref 11.7–15.4)
WBC: 7.1 10*3/uL (ref 3.4–10.8)

## 2021-10-21 LAB — COMPREHENSIVE METABOLIC PANEL
ALT: 20 IU/L (ref 0–32)
AST: 29 IU/L (ref 0–40)
Albumin/Globulin Ratio: 1.7 (ref 1.2–2.2)
Albumin: 4.7 g/dL (ref 3.8–4.8)
Alkaline Phosphatase: 71 IU/L (ref 44–121)
BUN/Creatinine Ratio: 23 (ref 12–28)
BUN: 19 mg/dL (ref 8–27)
Bilirubin Total: 0.4 mg/dL (ref 0.0–1.2)
CO2: 21 mmol/L (ref 20–29)
Calcium: 9.4 mg/dL (ref 8.7–10.3)
Chloride: 99 mmol/L (ref 96–106)
Creatinine, Ser: 0.84 mg/dL (ref 0.57–1.00)
Globulin, Total: 2.7 g/dL (ref 1.5–4.5)
Glucose: 97 mg/dL (ref 70–99)
Potassium: 4.4 mmol/L (ref 3.5–5.2)
Sodium: 136 mmol/L (ref 134–144)
Total Protein: 7.4 g/dL (ref 6.0–8.5)
eGFR: 75 mL/min/{1.73_m2} (ref >59)

## 2021-10-21 LAB — LIPID PANEL
Chol/HDL Ratio: 4.8 ratio — ABNORMAL HIGH (ref 0.0–4.4)
Cholesterol, Total: 220 mg/dL — ABNORMAL HIGH (ref 100–199)
HDL: 46 mg/dL (ref >39)
LDL Chol Calc (NIH): 143 mg/dL — ABNORMAL HIGH (ref 0–99)
Triglycerides: 172 mg/dL — ABNORMAL HIGH (ref 0–149)
VLDL Cholesterol Cal: 31 mg/dL (ref 5–40)

## 2021-10-21 LAB — VITAMIN B12: Vitamin B-12: 636 pg/mL (ref 232–1245)

## 2021-10-21 LAB — SEDIMENTATION RATE: Sed Rate: 42 mm/hr — ABNORMAL HIGH (ref 0–40)

## 2021-10-24 ENCOUNTER — Telehealth: Payer: Self-pay

## 2021-10-24 DIAGNOSIS — E782 Mixed hyperlipidemia: Secondary | ICD-10-CM

## 2021-10-24 MED ORDER — EZETIMIBE 10 MG PO TABS: 10.0000 mg | ORAL_TABLET | Freq: Every day | ORAL | 0 refills | Status: DC

## 2021-10-24 NOTE — Telephone Encounter (Signed)
-----   Message from Virginia Crews, MD sent at 10/23/2021  8:16 AM EDT ----- Normal/stable labs, except for elevated cholesterol from previous.  Consider Zetia 10mg  daily (a non-statin option to lower cholesterol).  Will forward results requested to Dr Posey Pronto also.

## 2021-10-24 NOTE — Telephone Encounter (Signed)
Patient advised as directed below. She is willing to try Zetia. Prescription [facility] in [place].

## 2021-10-30 DIAGNOSIS — M75101 Unspecified rotator cuff tear or rupture of right shoulder, not specified as traumatic: Secondary | ICD-10-CM | POA: Diagnosis not present

## 2021-11-15 ENCOUNTER — Other Ambulatory Visit: Payer: Self-pay

## 2021-11-15 ENCOUNTER — Ambulatory Visit (INDEPENDENT_AMBULATORY_CARE_PROVIDER_SITE_OTHER): Payer: Medicare Other

## 2021-11-15 DIAGNOSIS — E538 Deficiency of other specified B group vitamins: Secondary | ICD-10-CM

## 2021-11-15 MED ORDER — CYANOCOBALAMIN 1000 MCG/ML IJ SOLN
1000.0000 ug | Freq: Once | INTRAMUSCULAR | Status: AC
  Administered 2021-11-15: 1000 ug via INTRAMUSCULAR

## 2021-11-23 DIAGNOSIS — M24811 Other specific joint derangements of right shoulder, not elsewhere classified: Secondary | ICD-10-CM | POA: Diagnosis not present

## 2021-11-25 ENCOUNTER — Other Ambulatory Visit: Payer: Self-pay | Admitting: Family Medicine

## 2021-11-25 NOTE — Telephone Encounter (Signed)
Last RF 11/08/20 #18 5 RF Active med and Future visit scheduled Last OV that addresses  med 11/08/20 Requested Prescriptions  Pending Prescriptions Disp Refills   SUMAtriptan (IMITREX) 100 MG tablet [Pharmacy Med Name: SUMATRIPTAN  TAB 100MG ] 18 tablet 5    Sig: TAKE 1 TABLET DAILY AS     NEEDED. MAY REPEAT IN 2    HOURS IF HEADACHE PERSISTS OR RECURS     Neurology:  Migraine Therapy - Triptan Passed - 11/25/2021  1:09 AM      Passed - Last BP in normal range    BP Readings from Last 1 Encounters:  10/20/21 106/60          Passed - Valid encounter within last 12 months    Recent Outpatient Visits           1 month ago Encounter for annual wellness visit (AWV) in Medicare patient   [facility] [place], [name], MD   9 months ago Aortic atherosclerosis [facility])   [facility], [name], MD   1 year ago Psychophysiological insomnia   [facility], [name], MD   1 year ago Fever, unspecified fever cause   [facility] [place], [name], MD   1 year ago Essential hypertension   [place], [name], MD       Future Appointments             In 4 months Bacigalupo, [name], MD [facility] [place]

## 2021-11-26 ENCOUNTER — Other Ambulatory Visit: Payer: Self-pay | Admitting: Family Medicine

## 2021-11-26 NOTE — Telephone Encounter (Signed)
Requested medication (s) are due for refill today: yes  Requested medication (s) are on the active medication list: yes  Last refill:  09/04/21 #90   Future visit scheduled: yes  Notes to clinic:  med not delegated to NT to RF   Requested Prescriptions  Pending Prescriptions Disp Refills   estradiol (ESTRACE) 2 MG tablet [Pharmacy Med Name: ESTRADIOL TAB 2MG ] 90 tablet 0    Sig: TAKE 1 TABLET DAILY     OB/GYN:  Estrogens Passed - 11/26/2021  8:07 AM      Passed - Mammogram is up-to-date per Health Maintenance      Passed - Last BP in normal range    BP Readings from Last 1 Encounters:  10/20/21 106/60          Passed - Valid encounter within last 12 months    Recent Outpatient Visits           1 month ago Encounter for annual wellness visit (AWV) in Medicare patient   [facility] [place], [name], MD   9 months ago Aortic atherosclerosis [facility])   [facility] [place], [name], MD   1 year ago Psychophysiological insomnia   [facility] [place], [name], MD   1 year ago Fever, unspecified fever cause   [facility] [place], [name], MD   1 year ago Essential hypertension   [place], [name], MD       Future Appointments             In 4 months Bacigalupo, [name], MD [facility], [place]

## 2021-11-27 DIAGNOSIS — L4050 Arthropathic psoriasis, unspecified: Secondary | ICD-10-CM | POA: Diagnosis not present

## 2021-12-13 DIAGNOSIS — M79671 Pain in right foot: Secondary | ICD-10-CM | POA: Diagnosis not present

## 2021-12-13 DIAGNOSIS — M25531 Pain in right wrist: Secondary | ICD-10-CM | POA: Diagnosis not present

## 2021-12-13 DIAGNOSIS — M79672 Pain in left foot: Secondary | ICD-10-CM | POA: Diagnosis not present

## 2021-12-13 DIAGNOSIS — M25532 Pain in left wrist: Secondary | ICD-10-CM | POA: Diagnosis not present

## 2021-12-13 DIAGNOSIS — M25542 Pain in joints of left hand: Secondary | ICD-10-CM | POA: Diagnosis not present

## 2021-12-13 DIAGNOSIS — M7070 Other bursitis of hip, unspecified hip: Secondary | ICD-10-CM | POA: Diagnosis not present

## 2021-12-13 DIAGNOSIS — M25541 Pain in joints of right hand: Secondary | ICD-10-CM | POA: Diagnosis not present

## 2021-12-13 DIAGNOSIS — Z79899 Other long term (current) drug therapy: Secondary | ICD-10-CM | POA: Diagnosis not present

## 2021-12-13 DIAGNOSIS — R7982 Elevated C-reactive protein (CRP): Secondary | ICD-10-CM | POA: Diagnosis not present

## 2021-12-13 DIAGNOSIS — M25559 Pain in unspecified hip: Secondary | ICD-10-CM | POA: Diagnosis not present

## 2021-12-20 ENCOUNTER — Other Ambulatory Visit: Payer: Self-pay

## 2021-12-20 ENCOUNTER — Ambulatory Visit (INDEPENDENT_AMBULATORY_CARE_PROVIDER_SITE_OTHER): Payer: Medicare Other

## 2021-12-20 DIAGNOSIS — E538 Deficiency of other specified B group vitamins: Secondary | ICD-10-CM

## 2021-12-20 MED ORDER — CYANOCOBALAMIN 1000 MCG/ML IJ SOLN
1000.0000 ug | Freq: Once | INTRAMUSCULAR | Status: AC
  Administered 2021-12-20: 1000 ug via INTRAMUSCULAR

## 2022-01-17 ENCOUNTER — Ambulatory Visit (INDEPENDENT_AMBULATORY_CARE_PROVIDER_SITE_OTHER): Payer: Medicare Other

## 2022-01-17 ENCOUNTER — Other Ambulatory Visit: Payer: Self-pay

## 2022-01-17 DIAGNOSIS — E538 Deficiency of other specified B group vitamins: Secondary | ICD-10-CM | POA: Diagnosis not present

## 2022-01-17 MED ORDER — CYANOCOBALAMIN 1000 MCG/ML IJ SOLN
1000.0000 ug | Freq: Once | INTRAMUSCULAR | Status: AC
  Administered 2022-01-17: 1000 ug via INTRAMUSCULAR

## 2022-01-20 ENCOUNTER — Other Ambulatory Visit: Payer: Self-pay | Admitting: Family Medicine

## 2022-01-20 DIAGNOSIS — E782 Mixed hyperlipidemia: Secondary | ICD-10-CM

## 2022-01-21 NOTE — Telephone Encounter (Signed)
Requested Prescriptions  Pending Prescriptions Disp Refills   ezetimibe (ZETIA) 10 MG tablet [Pharmacy Med Name: EZETIMIBE 10 MG TABLET] 90 tablet 0    Sig: TAKE 1 TABLET BY MOUTH EVERY DAY     Cardiovascular:  Antilipid - Sterol Transport Inhibitors Failed - 01/20/2022  1:08 PM      Failed - Total Cholesterol in normal range and within 360 days    Cholesterol, Total  Date Value Ref Range Status  10/20/2021 220 (H) 100 - 199 mg/dL Final         Failed - LDL in normal range and within 360 days    LDL Cholesterol (Calc)  Date Value Ref Range Status  10/16/2017 132 (H) mg/dL (calc) Final    Comment:    Reference range: <100 . Desirable range <100 mg/dL for primary prevention;   <70 mg/dL for patients with CHD or diabetic patients  with > or = 2 CHD risk factors. Marland Kitchen LDL-C is now calculated using the Martin-Hopkins  calculation, which is a validated novel method providing  better accuracy than the Friedewald equation in the  estimation of LDL-C.  Cresenciano Genre et al. Annamaria Helling. 0938;182(99): 2061-2068  (http://education.[employer].com/faq/FAQ164)    LDL Chol Calc (NIH)  Date Value Ref Range Status  10/20/2021 143 (H) 0 - 99 mg/dL Final         Failed - Triglycerides in normal range and within 360 days    Triglycerides  Date Value Ref Range Status  10/20/2021 172 (H) 0 - 149 mg/dL Final         Passed - HDL in normal range and within 360 days    HDL  Date Value Ref Range Status  10/20/2021 46 >39 mg/dL Final         Passed - Valid encounter within last 12 months    Recent Outpatient Visits          3 months ago Encounter for annual wellness visit (AWV) in Medicare patient   [facility] [place], [name], MD   11 months ago Aortic atherosclerosis [facility])   [facility], [name], MD   1 year ago Psychophysiological insomnia   [facility], [name], MD   1 year ago Fever, unspecified fever cause    [facility] [place], [name], MD   1 year ago Essential hypertension   [place], [name], MD      Future Appointments            In 2 months Bacigalupo, [name], MD [facility]            gabapentin (NEURONTIN) 400 MG capsule [Pharmacy Med Name: GABAPENTIN 400 MG CAPSULE] 270 capsule 0    Sig: TAKE 1 CAPSULE BY [place]     Neurology: Anticonvulsants - gabapentin Passed - 01/20/2022  1:08 PM      Passed - Valid encounter within last 12 months    Recent Outpatient Visits          3 months ago Encounter for annual wellness visit (AWV) in Medicare patient   [facility] [place], [name], MD   11 months ago Aortic atherosclerosis [facility])   [facility] [place], [name], MD   1 year ago Psychophysiological insomnia   [facility] [place], [name], MD   1 year ago Fever, unspecified fever cause   [facility], [name], MD  1 year ago Essential hypertension   [facility] [place], [name], MD      Future Appointments            In 2 months Bacigalupo, [name], MD [facility], [place]

## 2022-02-01 ENCOUNTER — Encounter: Payer: Self-pay | Admitting: Family Medicine

## 2022-02-02 MED ORDER — PROMETHAZINE HCL 25 MG PO TABS: ORAL_TABLET | ORAL | 1 refills | Status: DC

## 2022-02-14 ENCOUNTER — Other Ambulatory Visit: Payer: Self-pay

## 2022-02-14 ENCOUNTER — Ambulatory Visit (INDEPENDENT_AMBULATORY_CARE_PROVIDER_SITE_OTHER): Payer: Medicare Other

## 2022-02-14 DIAGNOSIS — E538 Deficiency of other specified B group vitamins: Secondary | ICD-10-CM

## 2022-02-14 MED ORDER — CYANOCOBALAMIN 1000 MCG/ML IJ SOLN
1000.0000 ug | Freq: Once | INTRAMUSCULAR | Status: AC
  Administered 2022-02-14: 1000 ug via INTRAMUSCULAR

## 2022-03-14 ENCOUNTER — Ambulatory Visit (INDEPENDENT_AMBULATORY_CARE_PROVIDER_SITE_OTHER): Payer: Medicare Other

## 2022-03-14 ENCOUNTER — Other Ambulatory Visit: Payer: Self-pay

## 2022-03-14 DIAGNOSIS — E538 Deficiency of other specified B group vitamins: Secondary | ICD-10-CM

## 2022-03-14 MED ORDER — CYANOCOBALAMIN 1000 MCG/ML IJ SOLN
1000.0000 ug | Freq: Once | INTRAMUSCULAR | Status: AC
  Administered 2022-03-14: 1000 ug via INTRAMUSCULAR

## 2022-03-28 ENCOUNTER — Encounter: Payer: Self-pay | Admitting: Family Medicine

## 2022-03-29 ENCOUNTER — Telehealth: Payer: Self-pay | Admitting: Family Medicine

## 2022-03-29 DIAGNOSIS — E782 Mixed hyperlipidemia: Secondary | ICD-10-CM

## 2022-03-29 MED ORDER — LISINOPRIL-HYDROCHLOROTHIAZIDE 10-12.5 MG PO TABS: 1.0000 | ORAL_TABLET | Freq: Every day | ORAL | 0 refills | Status: DC

## 2022-03-29 MED ORDER — EZETIMIBE 10 MG PO TABS: 10.0000 mg | ORAL_TABLET | Freq: Every day | ORAL | 1 refills | Status: DC

## 2022-03-29 MED ORDER — ESTRADIOL 2 MG PO TABS: 2.0000 mg | ORAL_TABLET | Freq: Every day | ORAL | 0 refills | Status: DC

## 2022-03-29 NOTE — Telephone Encounter (Signed)
[facility] faxed refill request for the following medications: ? ?ezetimibe (ZETIA) 10 MG tablet  ? ?Please advise. ? ?

## 2022-04-11 ENCOUNTER — Encounter: Payer: Self-pay | Admitting: Family Medicine

## 2022-04-11 ENCOUNTER — Ambulatory Visit (INDEPENDENT_AMBULATORY_CARE_PROVIDER_SITE_OTHER): Payer: Medicare Other

## 2022-04-11 DIAGNOSIS — E538 Deficiency of other specified B group vitamins: Secondary | ICD-10-CM | POA: Diagnosis not present

## 2022-04-11 MED ORDER — CYANOCOBALAMIN 1000 MCG/ML IJ SOLN
1000.0000 ug | Freq: Once | INTRAMUSCULAR | Status: AC
  Administered 2022-04-11: 1000 ug via INTRAMUSCULAR

## 2022-04-19 ENCOUNTER — Other Ambulatory Visit: Payer: Self-pay | Admitting: Family Medicine

## 2022-04-19 DIAGNOSIS — E782 Mixed hyperlipidemia: Secondary | ICD-10-CM

## 2022-04-20 ENCOUNTER — Ambulatory Visit: Payer: Medicare Other | Admitting: Family Medicine

## 2022-05-09 ENCOUNTER — Ambulatory Visit: Payer: Medicare Other

## 2022-05-17 ENCOUNTER — Ambulatory Visit (INDEPENDENT_AMBULATORY_CARE_PROVIDER_SITE_OTHER): Payer: Medicare Other | Admitting: Family Medicine

## 2022-05-17 ENCOUNTER — Encounter: Payer: Self-pay | Admitting: Family Medicine

## 2022-05-17 VITALS — BP 124/80 | HR 84 | Temp 98.3°F | Resp 16 | Ht 68.5 in | Wt 180.8 lb

## 2022-05-17 DIAGNOSIS — R7303 Prediabetes: Secondary | ICD-10-CM | POA: Diagnosis not present

## 2022-05-17 DIAGNOSIS — Z789 Other specified health status: Secondary | ICD-10-CM

## 2022-05-17 DIAGNOSIS — L4050 Arthropathic psoriasis, unspecified: Secondary | ICD-10-CM | POA: Diagnosis not present

## 2022-05-17 DIAGNOSIS — I10 Essential (primary) hypertension: Secondary | ICD-10-CM | POA: Diagnosis not present

## 2022-05-17 DIAGNOSIS — D692 Other nonthrombocytopenic purpura: Secondary | ICD-10-CM

## 2022-05-17 DIAGNOSIS — S22000A Wedge compression fracture of unspecified thoracic vertebra, initial encounter for closed fracture: Secondary | ICD-10-CM

## 2022-05-17 DIAGNOSIS — I700 Atherosclerosis of aorta: Secondary | ICD-10-CM

## 2022-05-17 DIAGNOSIS — E782 Mixed hyperlipidemia: Secondary | ICD-10-CM | POA: Diagnosis not present

## 2022-05-17 NOTE — Assessment & Plan Note (Signed)
H/o pancreatitis from statin use - intolerant

## 2022-05-17 NOTE — Assessment & Plan Note (Signed)
Recommend low carb diet °Recheck A1c  °

## 2022-05-17 NOTE — Assessment & Plan Note (Signed)
Well controlled Continue current medications Recheck metabolic panel F/u in 6 months  

## 2022-05-17 NOTE — Assessment & Plan Note (Signed)
Stable, managed by Rheum Continue MTX and folic acid

## 2022-05-17 NOTE — Assessment & Plan Note (Signed)
Chronic and stable Continue to monitor 

## 2022-05-17 NOTE — Progress Notes (Signed)
I,[name],acting as a Education administrator for [name], MD.,have documented all relevant documentation on the behalf of [name], MD,as directed by  [name], MD while in the presence of [name], MD.   Established patient visit   Patient: Shannon Grimes   DOB: 1951-12-03   71 y.o. Female  MRN: [id number] Visit Date: 05/17/2022  Today's healthcare provider: [name], MD   Chief Complaint  Patient presents with   Hypertension   Hyperlipidemia   Hyperglycemia   Subjective    HPI  Prediabetes, Follow-up  Lab Results  Component Value Date   HGBA1C 5.7 (H) 10/20/2021   HGBA1C 5.5 02/09/2021   HGBA1C 5.5 11/08/2020   GLUCOSE 97 10/20/2021   GLUCOSE 96 11/08/2020   GLUCOSE 97 05/12/2020    Last seen for for this6 months ago.  Management since that visit includes no changes. Current symptoms include none and have been stable.  Prior visit with dietician: no Current diet: in general, a "healthy" diet   Current exercise: swimming  Pertinent Labs:    Component Value Date/Time   CHOL 220 (H) 10/20/2021 1018   TRIG 172 (H) 10/20/2021 1018   CHOLHDL 4.8 (H) 10/20/2021 1018   CHOLHDL 5.1 (H) 10/16/2017 1058   CREATININE 0.84 10/20/2021 1018   CREATININE 0.9 05/06/2019 0000    Wt Readings from Last 3 Encounters:  05/17/22 180 lb 12.8 oz (82 kg)  10/20/21 185 lb 9.6 oz (84.2 kg)  02/09/21 179 lb 4.8 oz (81.3 kg)    -----------------------------------------------------------------------------------------  Hypertension, follow-up  BP Readings from Last 3 Encounters:  05/17/22 124/80  10/20/21 106/60  02/09/21 (!) 147/84   Wt Readings from Last 3 Encounters:  05/17/22 180 lb 12.8 oz (82 kg)  10/20/21 185 lb 9.6 oz (84.2 kg)  02/09/21 179 lb 4.8 oz (81.3 kg)     She was last seen for hypertension 6 months ago.  BP at that visit was 106/60. Management since that visit includes no changes. She reports excellent  compliance with treatment. She is not having side effects.  She is adherent to low salt diet.   Outside blood pressures are 130/80s.  She does not smoke.  Use of agents associated with hypertension: NSAIDS.   --------------------------------------------------------------------------------------------------- Lipid/Cholesterol, follow-up  Last Lipid Panel: Lab Results  Component Value Date   CHOL 220 (H) 10/20/2021   LDLCALC 143 (H) 10/20/2021   HDL 46 10/20/2021   TRIG 172 (H) 10/20/2021    She was last seen for this 6 months ago.  Management since that visit includes no changes.  She reports excellent compliance with treatment. She is not having side effects.   Symptoms: No appetite changes No foot ulcerations  No chest pain No chest pressure/discomfort  No dyspnea No orthopnea  No fatigue Yes lower extremity edema  No palpitations No paroxysmal nocturnal dyspnea  Yes nausea Yes numbness or tingling of extremity  No polydipsia No polyuria  No speech difficulty No syncope    Last metabolic panel Lab Results  Component Value Date   GLUCOSE 97 10/20/2021   NA 136 10/20/2021   K 4.4 10/20/2021   BUN 19 10/20/2021   CREATININE 0.84 10/20/2021   EGFR 75 10/20/2021   GFRNONAA 61 11/08/2020   CALCIUM 9.4 10/20/2021   AST 29 10/20/2021   ALT 20 10/20/2021   The 10-year ASCVD risk score (Arnett DK, et al., 2019) is: 13.8%  ---------------------------------------------------------------------------------------------------  [place] 4 weeks ago and was seen at [facility] and  offered Kyphoplasty for T12 compression fracture. She hasn't had that and is considering.  Wants second opinion from [place] - will send Referral   Medications: Outpatient Medications Prior to Visit  Medication Sig   cyanocobalamin (,VITAMIN B-12,) 1000 MCG/ML injection INJECT 1 ML AS DIRECTED EVERY 30 (THIRTY) DAYS.   estradiol (ESTRACE) 2 MG tablet Take 1 tablet (2 mg total) by mouth daily.    ezetimibe (ZETIA) 10 MG tablet TAKE 1 TABLET BY MOUTH EVERY DAY   folic acid (FOLVITE) 1 MG tablet folic acid 1 mg tablet  TK 1 T PO QD   gabapentin (NEURONTIN) 400 MG capsule TAKE 1 CAPSULE BY MOUTH THREE TIMES A DAY   HYDROcodone Bitartrate ER 20 MG T24A Take 1 tablet by mouth daily.    ibuprofen (ADVIL) 200 MG tablet Take 200 mg by mouth every 6 (six) hours as needed.   inFLIXimab in sodium chloride 0.9 % Inject into the vein.   lisinopril-hydrochlorothiazide (ZESTORETIC) 10-12.5 MG tablet Take 1 tablet by mouth daily.   methotrexate (RHEUMATREX) 2.5 MG tablet 2.5 mg. 5 tablets weekly   nortriptyline (PAMELOR) 50 MG capsule Take 1 capsule (50 mg total) by mouth at bedtime.   promethazine (PHENERGAN) 25 MG tablet TAKE 1 TABLET EVERY 4 HOURSAS NEEDED   SUMAtriptan (IMITREX) 100 MG tablet TAKE 1 TABLET DAILY AS     NEEDED. MAY REPEAT IN 2    HOURS IF HEADACHE PERSISTS OR RECURS   tiZANidine (ZANAFLEX) 4 MG tablet TAKE 1 TABLET TWICE A DAY   [DISCONTINUED] betamethasone dipropionate 0.05 % cream Apply topically 2 (two) times daily. (Patient not taking: Reported on 05/17/2022)   [DISCONTINUED] celecoxib (CELEBREX) 200 MG capsule Take 200 mg by mouth daily as needed. (Patient not taking: Reported on 05/17/2022)   [DISCONTINUED] lubiprostone (AMITIZA) 24 MCG capsule Take 1 capsule (24 mcg total) by mouth 2 (two) times daily with a meal. (Patient not taking: Reported on 05/17/2022)   No facility-administered medications prior to visit.    Review of Systems  Constitutional:  Positive for activity change, appetite change and fatigue. Negative for unexpected weight change.  Respiratory:  Negative for chest tightness and shortness of breath.   Cardiovascular:  Positive for leg swelling. Negative for chest pain and palpitations.  Musculoskeletal:  Positive for arthralgias, back pain, gait problem and myalgias.       Objective    BP 124/80 (BP Location: Left Arm, Patient Position: Sitting, Cuff Size:  Large)   Pulse 84   Temp 98.3 F (36.8 C) (Oral)   Resp 16   Ht 5' 8.5" (1.74 m)   Wt 180 lb 12.8 oz (82 kg)   BMI 27.09 kg/m  BP Readings from Last 3 Encounters:  05/17/22 124/80  10/20/21 106/60  02/09/21 (!) 147/84   Wt Readings from Last 3 Encounters:  05/17/22 180 lb 12.8 oz (82 kg)  10/20/21 185 lb 9.6 oz (84.2 kg)  02/09/21 179 lb 4.8 oz (81.3 kg)      Physical Exam Vitals reviewed.  Constitutional:      General: She is not in acute distress.    Appearance: Normal appearance. She is well-developed. She is not diaphoretic.  HENT:     Head: Normocephalic and atraumatic.  Eyes:     General: No scleral icterus.    Conjunctiva/sclera: Conjunctivae normal.  Neck:     Thyroid: No thyromegaly.  Cardiovascular:     Rate and Rhythm: Normal rate and regular rhythm.     Pulses: Normal pulses.  Heart sounds: Normal heart sounds. No murmur heard. Pulmonary:     Effort: Pulmonary effort is normal. No respiratory distress.     Breath sounds: Normal breath sounds. No wheezing, rhonchi or rales.  Musculoskeletal:     Cervical back: Neck supple.     Right lower leg: No edema.     Left lower leg: No edema.  Lymphadenopathy:     Cervical: No cervical adenopathy.  Skin:    General: Skin is warm and dry.  Neurological:     Mental Status: She is alert and oriented to person, place, and time. Mental status is at baseline.  Psychiatric:        Mood and Affect: Mood normal.        Behavior: Behavior normal.      No results found for any visits on 05/17/22.  Assessment & Plan     Problem List Items Addressed This Visit       Cardiovascular and Mediastinum   Hypertension - Primary    Well controlled Continue current medications Recheck metabolic panel F/u in 6 months       Relevant Orders   Comprehensive metabolic panel   Aortic atherosclerosis ([place])    Continue risk factor management Statin intolerant       Senile purpura (HCC)    Chronic and  stable Continue to monitor         Musculoskeletal and Integument   Psoriatic arthritis (HCC)    Stable, managed by Rheum Continue MTX and folic acid         Other   Hyperlipidemia    Reviewed last lipid panel Not currently on a statin - intolerant Continue Zetia Recheck FLP and CMP Discussed diet and exercise       Relevant Orders   Lipid Panel With LDL/HDL Ratio   Prediabetes    Recommend low carb diet Recheck A1c       Relevant Orders   Hemoglobin A1c   Statin intolerance    H/o pancreatitis from statin use - intolerant       Other Visit Diagnoses     Compression fracture of body of thoracic vertebra ([place])       Relevant Orders   Ambulatory referral to Neurosurgery        Return in about 6 months (around 11/17/2022) for CPE, AWV.      I, [name], MD, have reviewed all documentation for this visit. The documentation on 05/17/22 for the exam, diagnosis, procedures, and orders are all accurate and complete.   Honey Zakarian, [name], MD, MPH [facility]

## 2022-05-17 NOTE — Assessment & Plan Note (Signed)
Continue risk factor management Statin intolerant

## 2022-05-17 NOTE — Assessment & Plan Note (Signed)
Reviewed last lipid panel Not currently on a statin - intolerant Continue Zetia Recheck FLP and CMP Discussed diet and exercise

## 2022-05-18 ENCOUNTER — Telehealth: Payer: Self-pay

## 2022-05-18 ENCOUNTER — Encounter: Payer: Self-pay | Admitting: Family Medicine

## 2022-05-18 DIAGNOSIS — R748 Abnormal levels of other serum enzymes: Secondary | ICD-10-CM

## 2022-05-18 LAB — COMPREHENSIVE METABOLIC PANEL
ALT: 18 IU/L (ref 0–32)
AST: 26 IU/L (ref 0–40)
Albumin/Globulin Ratio: 1.4 (ref 1.2–2.2)
Albumin: 4.5 g/dL (ref 3.7–4.7)
Alkaline Phosphatase: 145 IU/L — ABNORMAL HIGH (ref 44–121)
BUN/Creatinine Ratio: 21 (ref 12–28)
BUN: 17 mg/dL (ref 8–27)
Bilirubin Total: 0.4 mg/dL (ref 0.0–1.2)
CO2: 19 mmol/L — ABNORMAL LOW (ref 20–29)
Calcium: 9.6 mg/dL (ref 8.7–10.3)
Chloride: 101 mmol/L (ref 96–106)
Creatinine, Ser: 0.8 mg/dL (ref 0.57–1.00)
Globulin, Total: 3.2 g/dL (ref 1.5–4.5)
Glucose: 107 mg/dL — ABNORMAL HIGH (ref 70–99)
Potassium: 4.3 mmol/L (ref 3.5–5.2)
Sodium: 137 mmol/L (ref 134–144)
Total Protein: 7.7 g/dL (ref 6.0–8.5)
eGFR: 79 mL/min/{1.73_m2} (ref >59)

## 2022-05-18 LAB — LIPID PANEL WITH LDL/HDL RATIO
Cholesterol, Total: 201 mg/dL — ABNORMAL HIGH (ref 100–199)
HDL: 44 mg/dL (ref >39)
LDL Chol Calc (NIH): 122 mg/dL — ABNORMAL HIGH (ref 0–99)
LDL/HDL Ratio: 2.8 ratio (ref 0.0–3.2)
Triglycerides: 200 mg/dL — ABNORMAL HIGH (ref 0–149)
VLDL Cholesterol Cal: 35 mg/dL (ref 5–40)

## 2022-05-18 LAB — HEMOGLOBIN A1C
Est. average glucose Bld gHb Est-mCnc: 114 mg/dL
Hgb A1c MFr Bld: 5.6 % (ref 4.8–5.6)

## 2022-05-18 NOTE — Telephone Encounter (Signed)
-----  Message from Virginia Crews, MD sent at 05/18/2022  8:11 AM EDT ----- Normal/stable labs.  Increase in alk phos since it was last checked.  Recommend hydrating well, decreasing Tylenol and alcohol intake, and repeating LFTs and fractionated alk phos in 1 month.

## 2022-05-18 NOTE — Telephone Encounter (Signed)
Patient advised. Labs ordered and placed up front.

## 2022-05-23 ENCOUNTER — Encounter: Payer: Self-pay | Admitting: Family Medicine

## 2022-05-24 ENCOUNTER — Other Ambulatory Visit: Payer: Self-pay | Admitting: *Deleted

## 2022-05-24 MED ORDER — SUMATRIPTAN SUCCINATE 100 MG PO TABS: ORAL_TABLET | ORAL | 5 refills | Status: DC

## 2022-05-24 NOTE — Telephone Encounter (Signed)
Please advise refill?  LOV: 05/17/2022 NOV: 11/06/2022 Last refill: 11/27/2021

## 2022-05-25 ENCOUNTER — Other Ambulatory Visit: Payer: Self-pay

## 2022-05-25 MED ORDER — SUMATRIPTAN SUCCINATE 100 MG PO TABS: ORAL_TABLET | ORAL | 5 refills | Status: DC

## 2022-06-08 ENCOUNTER — Ambulatory Visit: Payer: Self-pay

## 2022-06-08 NOTE — Telephone Encounter (Signed)
Third attempt made to contact pt. LM on VM that NT will forward her question to the providers nurse. Call back number provided in case of further questions.

## 2022-06-08 NOTE — Telephone Encounter (Signed)
MD office notified of question and they have already contacted pt to let her know it would interfere and she needs to reschedule. Reason for Disposition . [1] Other NON-URGENT information for PCP AND [2] does not require PCP response  Answer Assessment - Initial Assessment Questions 1. REASON FOR CALL or QUESTION: "What is your reason for calling today?" or "How can I best help you?" or "What question do you have that I can help answer?"     Are the labs scheduled for 6/28 going to be affected by the infusion the rheumatologist is giving me prior to that?  Protocols used: Information Only Call - No Triage-A-AH, PCP Call - No Triage-A-AH

## 2022-06-08 NOTE — Telephone Encounter (Signed)
Pt is getting infusion from rheumatologist on 06/11/22 and B-12 06/20/22. She is wondering if the infusion on the 19th would alter her labs for hepatic function, alkaline phosphatase, and isoenzymes if she has them drawn on 06/20/22?

## 2022-06-08 NOTE — Telephone Encounter (Addendum)
Reason for Disposition  Third attempt to contact caller AND no contact made. Phone number verified.  Protocols used: No Contact or Duplicate Contact Call-A-AH Forwarding to office.

## 2022-06-08 NOTE — Telephone Encounter (Signed)
Please Review for Dr. Jacinto Reap and Advise.

## 2022-06-08 NOTE — Telephone Encounter (Signed)
Summary: Lab Advice   Pt is calling to scheduled to get an infusion 06/11/22 and pt has B-12 scheduled 06/20/22.  Pt would like to know is it ok to get labs Hepatic function panel (Order [id number]) &  Alkaline phosphatase, isoenzymes (Order [id number]) on 06/20/22?     Called pt - LMOM to return call

## 2022-06-08 NOTE — Telephone Encounter (Signed)
Patient was advise. 

## 2022-06-08 NOTE — Telephone Encounter (Signed)
Summary: Lab Advice   Pt is calling to scheduled to get an infusion 06/11/22 and pt has B-12 scheduled 06/20/22.  Pt would like to know is it ok to get labs Hepatic function panel (Order [id number]) &  Alkaline phosphatase, isoenzymes (Order [id number]) on 06/20/22?     Called pt - LMOM to return call.

## 2022-06-09 ENCOUNTER — Other Ambulatory Visit: Payer: Self-pay | Admitting: Family Medicine

## 2022-06-20 ENCOUNTER — Ambulatory Visit: Payer: Medicare Other

## 2022-06-20 DIAGNOSIS — E538 Deficiency of other specified B group vitamins: Secondary | ICD-10-CM

## 2022-06-20 MED ORDER — CYANOCOBALAMIN 1000 MCG/ML IJ SOLN
1000.0000 ug | Freq: Once | INTRAMUSCULAR | Status: AC
  Administered 2022-06-20: 1000 ug via INTRAMUSCULAR

## 2022-07-02 ENCOUNTER — Other Ambulatory Visit: Payer: Self-pay | Admitting: Family Medicine

## 2022-07-14 ENCOUNTER — Encounter: Payer: Self-pay | Admitting: Family Medicine

## 2022-07-16 ENCOUNTER — Other Ambulatory Visit: Payer: Self-pay | Admitting: Family Medicine

## 2022-07-16 MED ORDER — NORTRIPTYLINE HCL 50 MG PO CAPS: 50.0000 mg | ORAL_CAPSULE | Freq: Every day | ORAL | 1 refills | Status: DC

## 2022-07-18 ENCOUNTER — Ambulatory Visit: Payer: Medicare Other | Admitting: Physician Assistant

## 2022-07-18 DIAGNOSIS — E538 Deficiency of other specified B group vitamins: Secondary | ICD-10-CM

## 2022-07-18 MED ORDER — CYANOCOBALAMIN 1000 MCG/ML IJ SOLN: 1000.0000 ug | Freq: Once | INTRAMUSCULAR | Status: DC

## 2022-08-15 ENCOUNTER — Ambulatory Visit (INDEPENDENT_AMBULATORY_CARE_PROVIDER_SITE_OTHER): Payer: Medicare Other

## 2022-08-15 DIAGNOSIS — E538 Deficiency of other specified B group vitamins: Secondary | ICD-10-CM

## 2022-08-15 MED ORDER — CYANOCOBALAMIN 1000 MCG/ML IJ SOLN
1000.0000 ug | Freq: Once | INTRAMUSCULAR | Status: AC
  Administered 2022-08-15: 1000 ug via INTRAMUSCULAR

## 2022-08-20 ENCOUNTER — Other Ambulatory Visit: Payer: Self-pay | Admitting: Family Medicine

## 2022-08-20 DIAGNOSIS — E782 Mixed hyperlipidemia: Secondary | ICD-10-CM

## 2022-09-12 ENCOUNTER — Ambulatory Visit (INDEPENDENT_AMBULATORY_CARE_PROVIDER_SITE_OTHER): Payer: Medicare Other | Admitting: Family Medicine

## 2022-09-12 DIAGNOSIS — E538 Deficiency of other specified B group vitamins: Secondary | ICD-10-CM

## 2022-09-12 MED ORDER — CYANOCOBALAMIN 1000 MCG/ML IJ SOLN
1000.0000 ug | Freq: Once | INTRAMUSCULAR | Status: AC
  Administered 2022-09-12: 1000 ug via INTRAMUSCULAR

## 2022-09-12 NOTE — Progress Notes (Signed)
Nurse Visit-patient here for B12 injection.

## 2022-10-02 ENCOUNTER — Other Ambulatory Visit: Payer: Self-pay | Admitting: Family Medicine

## 2022-10-02 NOTE — Telephone Encounter (Signed)
rx was sent to pharmacy on 06/11/22 #90/1. Pt has 1 RF remaining.   Requested Prescriptions  Pending Prescriptions Disp Refills  . lisinopril-hydrochlorothiazide (ZESTORETIC) 10-12.5 MG tablet [Pharmacy Med Name: Lisinopril-hydroCHLOROthiazide 10-12.5 MG Oral Tablet] 90 tablet 3    Sig: TAKE 1 TABLET BY MOUTH DAILY     Cardiovascular:  ACEI + Diuretic Combos Passed - 10/02/2022  5:32 PM      Passed - Na in normal range and within 180 days    Sodium  Date Value Ref Range Status  05/17/2022 137 134 - 144 mmol/L Final  05/06/2019 136  Final         Passed - K in normal range and within 180 days    Potassium  Date Value Ref Range Status  05/17/2022 4.3 3.5 - 5.2 mmol/L Final  05/06/2019 4.6  Final         Passed - Cr in normal range and within 180 days    Creat  Date Value Ref Range Status  05/06/2019 0.9  Final   Creatinine, Ser  Date Value Ref Range Status  05/17/2022 0.80 0.57 - 1.00 mg/dL Final         Passed - eGFR is 30 or above and within 180 days    GFR, Est African American  Date Value Ref Range Status  10/16/2017 95 > OR = 60 mL/min/1.63m Final   GFR calc Af Amer  Date Value Ref Range Status  11/08/2020 70 >59 mL/min/1.73 Final    Comment:    **In accordance with recommendations from the NKF-ASN Task force,**   [facility] is in the process of updating its eGFR calculation to the   2021 CKD-EPI creatinine equation that estimates kidney function   without a race variable.    GFR, Est Non African American  Date Value Ref Range Status  10/16/2017 82 > OR = 60 mL/min/1.765mFinal   GFR calc non Af Amer  Date Value Ref Range Status  11/08/2020 61 >59 mL/min/1.73 Final   eGFR  Date Value Ref Range Status  05/17/2022 79 >59 mL/min/1.73 Final         Passed - Patient is not pregnant      Passed - Last BP in normal range    BP Readings from Last 1 Encounters:  05/17/22 124/80         Passed - Valid encounter within last 6 months    Recent Outpatient  Visits          2 weeks ago Vitamin B12 deficiency   [facility]   4 months ago Primary hypertension   [facility]   11 months ago Encounter for annual wellness visit (AWV) in Medicare patient   [facility]   [facility]  [facility]   1 year ago Psychophysiological insomnia   [facility]

## 2022-10-17 ENCOUNTER — Ambulatory Visit (INDEPENDENT_AMBULATORY_CARE_PROVIDER_SITE_OTHER): Payer: Medicare Other | Admitting: *Deleted

## 2022-10-17 DIAGNOSIS — E538 Deficiency of other specified B group vitamins: Secondary | ICD-10-CM

## 2022-10-17 MED ORDER — CYANOCOBALAMIN 1000 MCG/ML IJ SOLN
1000.0000 ug | Freq: Once | INTRAMUSCULAR | Status: AC
  Administered 2022-10-17: 1000 ug via INTRAMUSCULAR

## 2022-10-17 NOTE — Progress Notes (Signed)
Patient was given B12 injection in left deltoid. Patient tolerated well. Follow on B12 injection on 11/14/2022.

## 2022-10-17 NOTE — Patient Instructions (Signed)
Patient was given B12 injection in left deltoid. Patient tolerated well. Follow on B12 injection on 11/14/2022.

## 2022-11-02 NOTE — Progress Notes (Unsigned)
I,Shannon Grimes,acting as a Education administrator for [name], MD.,have documented all relevant documentation on the behalf of [name], MD,as directed by  [name], MD while in the presence of [name], MD.    Annual Wellness Visit     Patient: Shannon Grimes, Female    DOB: 1951/05/03, 71 y.o.   MRN: [id number] Visit Date: 11/06/2022  Today's Provider: [name], MD   Chief Complaint  Patient presents with   Medicare Wellness   Subjective    Shannon Grimes is a 71 y.o. female who presents today for her Annual Wellness Visit. She reports consuming a general diet. The patient does not participate in regular exercise at present. She generally feels well. She reports sleeping poorly. She does not have additional problems to discuss today.   HPI Patient request to hold off on mammogram. Patient request to hold off on colonoscopy.   Medications: Outpatient Medications Prior to Visit  Medication Sig   cyanocobalamin (,VITAMIN B-12,) 1000 MCG/ML injection INJECT 1 ML AS DIRECTED EVERY 30 (THIRTY) DAYS.   estradiol (ESTRACE) 2 MG tablet TAKE 1 TABLET BY MOUTH DAILY   ezetimibe (ZETIA) 10 MG tablet TAKE 1 TABLET BY MOUTH DAILY   folic acid (FOLVITE) 1 MG tablet folic acid 1 mg tablet  TK 1 T PO QD   gabapentin (NEURONTIN) 400 MG capsule TAKE 1 CAPSULE BY MOUTH THREE TIMES A DAY   HYDROcodone Bitartrate ER 20 MG T24A Take 1 tablet by mouth daily.    ibuprofen (ADVIL) 200 MG tablet Take 200 mg by mouth every 6 (six) hours as needed.   inFLIXimab in sodium chloride 0.9 % Inject into the vein.   lisinopril-hydrochlorothiazide (ZESTORETIC) 10-12.5 MG tablet TAKE 1 TABLET BY MOUTH DAILY   methotrexate (RHEUMATREX) 2.5 MG tablet 2.5 mg. 5 tablets weekly   nortriptyline (PAMELOR) 50 MG capsule Take 1 capsule (50 mg total) by mouth at bedtime.   promethazine (PHENERGAN) 25 MG tablet TAKE 1 TABLET EVERY 4 HOURSAS NEEDED   SUMAtriptan (IMITREX) 100 MG tablet  TAKE 1 TABLET AT ONSET, MAY REPEAT IN 2    HOURS IF HEADACHE PERSISTS OR RECURS   tiZANidine (ZANAFLEX) 4 MG tablet TAKE 1 TABLET TWICE A DAY   Facility-Administered Medications Prior to Visit  Medication Dose Route Frequency Provider   cyanocobalamin ((VITAMIN B-12)) injection 1,000 mcg  1,000 mcg Intramuscular Once Bacigalupo, [name], MD    Allergies  Allergen Reactions   Pentazocine Anaphylaxis   Statins Other (See Comments)    pancreatitis   Morphine Nausea And Vomiting    And migraine    Patient Care Team: Virginia Crews, MD as PCP - General (Family Medicine) Nadene Rubins, DO as Referring Physician (Physical Medicine and Rehabilitation) Jonathon Bellows, MD as Consulting Physician (Gastroenterology) Berline Chough, MD as Referring Physician (Rheumatology) Rutherford, Alfonso Patten., MD as Referring Physician (Orthopedic Surgery) Eulogio Bear, MD as Consulting Physician (Ophthalmology)  Review of Systems  Eyes:  Positive for visual disturbance.  Cardiovascular:  Positive for leg swelling.  Gastrointestinal:  Positive for constipation, diarrhea and nausea.  Endocrine: Positive for heat intolerance.  Musculoskeletal:  Positive for arthralgias, back pain, joint swelling, myalgias, neck pain and neck stiffness.  Allergic/Immunologic: Positive for environmental allergies and immunocompromised state.  Hematological:  Bruises/bleeds easily.  Psychiatric/Behavioral:  Positive for sleep disturbance.   All other systems reviewed and are negative.   Last CBC Lab Results  Component Value Date   WBC 7.1 10/20/2021  HGB 13.9 10/20/2021   HCT 42.4 10/20/2021   MCV 90 10/20/2021   MCH 29.6 10/20/2021   RDW 14.4 10/20/2021   PLT 207 99/37/1696   Last metabolic panel Lab Results  Component Value Date   GLUCOSE 107 (H) 05/17/2022   NA 137 05/17/2022   K 4.3 05/17/2022   CL 101 05/17/2022   CO2 19 (L) 05/17/2022   BUN 17 05/17/2022   CREATININE 0.80 05/17/2022    EGFR 79 05/17/2022   CALCIUM 9.6 05/17/2022   PROT 7.7 05/17/2022   ALBUMIN 4.5 05/17/2022   LABGLOB 3.2 05/17/2022   AGRATIO 1.4 05/17/2022   BILITOT 0.4 05/17/2022   ALKPHOS 145 (H) 05/17/2022   AST 26 05/17/2022   ALT 18 05/17/2022   ANIONGAP 12 03/07/2020   Last lipids Lab Results  Component Value Date   CHOL 201 (H) 05/17/2022   HDL 44 05/17/2022   LDLCALC 122 (H) 05/17/2022   TRIG 200 (H) 05/17/2022   CHOLHDL 4.8 (H) 10/20/2021   Last hemoglobin A1c Lab Results  Component Value Date   HGBA1C 5.6 05/17/2022   Last thyroid functions Lab Results  Component Value Date   TSH 1.88 11/19/2017   Last vitamin D No results found for: "25OHVITD2", "25OHVITD3", "VD25OH" Last vitamin B12 and Folate Lab Results  Component Value Date   VITAMINB12 636 10/20/2021        Objective    Vitals: BP 136/81 (BP Location: Left Arm, Patient Position: Sitting, Cuff Size: Large)   Pulse 67   Temp 98 F (36.7 C) (Oral)   Resp 16   Ht _0  (1.676 m)   Wt 184 lb 8 oz (83.7 kg)   BMI 29.78 kg/m  BP Readings from Last 3 Encounters:  11/06/22 136/81  05/17/22 124/80  10/20/21 106/60   Wt Readings from Last 3 Encounters:  11/06/22 184 lb 8 oz (83.7 kg)  05/17/22 180 lb 12.8 oz (82 kg)  10/20/21 185 lb 9.6 oz (84.2 kg)       Physical Exam Vitals reviewed.  Constitutional:      General: She is not in acute distress.    Appearance: Normal appearance. She is well-developed. She is not diaphoretic.  HENT:     Head: Normocephalic and atraumatic.     Right Ear: Tympanic membrane, ear canal and external ear normal.     Left Ear: Tympanic membrane, ear canal and external ear normal.     Nose: Nose normal.     Mouth/Throat:     Mouth: Mucous membranes are moist.     Pharynx: Oropharynx is clear. No oropharyngeal exudate.  Eyes:     General: No scleral icterus.    Conjunctiva/sclera: Conjunctivae normal.     Pupils: Pupils are equal, round, and reactive to light.  Neck:      Thyroid: No thyromegaly.  Cardiovascular:     Rate and Rhythm: Normal rate and regular rhythm.     Heart sounds: Murmur heard.  Pulmonary:     Effort: Pulmonary effort is normal. No respiratory distress.     Breath sounds: Normal breath sounds. No wheezing or rales.  Abdominal:     General: There is no distension.     Palpations: Abdomen is soft.     Tenderness: There is no abdominal tenderness.  Musculoskeletal:     Cervical back: Neck supple.     Right lower leg: No edema.     Left lower leg: No edema.  Lymphadenopathy:     Cervical:  No cervical adenopathy.  Skin:    General: Skin is warm and dry.     Findings: No rash.  Neurological:     Mental Status: She is alert and oriented to person, place, and time. Mental status is at baseline.  Psychiatric:        Mood and Affect: Mood normal.        Behavior: Behavior normal.        Thought Content: Thought content normal.     Most recent functional status assessment:    11/06/2022   10:10 AM  In your present state of health, do you have any difficulty performing the following activities:  Hearing? 0  Vision? 1  Difficulty concentrating or making decisions? 0  Walking or climbing stairs? 1  Dressing or bathing? 0  Doing errands, shopping? 0   Most recent fall risk assessment:    11/06/2022   10:09 AM  Fall Risk   Falls in the past year? 1  Number falls in past yr: 0  Injury with Fall? 1  Risk for fall due to : No Fall Risks  Follow up Falls evaluation completed    Most recent depression screenings:    11/06/2022   10:10 AM 05/17/2022    1:10 PM  PHQ 2/9 Scores  PHQ - 2 Score 0 0  PHQ- 9 Score 2 3   Most recent cognitive screening:    11/06/2022   10:15 AM  6CIT Screen  What Year? 0 points  What month? 0 points  What time? 0 points  Count back from 20 0 points  Months in reverse 0 points  Repeat phrase 0 points  Total Score 0 points   Most recent Audit-C alcohol use screening    11/06/2022    10:10 AM  Alcohol Use Disorder Test (AUDIT)  1. How often do you have a drink containing alcohol? 0  2. How many drinks containing alcohol do you have on a typical day when you are drinking? 0  3. How often do you have six or more drinks on one occasion? 0  AUDIT-C Score 0   A score of 3 or more in women, and 4 or more in men indicates increased risk for alcohol abuse, EXCEPT if all of the points are from question 1   No results found for any visits on 11/06/22.  Assessment & Plan     Annual wellness visit done today including the all of the following: Reviewed patient's Family Medical History Reviewed and updated list of patient's medical providers Assessment of cognitive impairment was done Assessed patient's functional ability Established a written schedule for health screening [place] Completed and Reviewed  Exercise Activities and Dietary recommendations  Goals      DIET - REDUCE SUGAR INTAKE     Recommend cutting back on desserts and sugars in daily diet. Pt is cutting back to eating 3 sweets a week.      LIFESTYLE - DECREASE FALLS RISK     Recommend to remove any items from the home that may cause slips or trips.        Immunization History  Administered Date(s) Administered   Fluad Quad(high Dose 65+) 10/21/2019, 11/08/2020, 10/19/2021, 11/06/2022   Influenza, High Dose Seasonal PF 10/24/2017, 10/28/2018   PFIZER(Purple Top)SARS-COV-2 Vaccination 04/01/2020, 04/22/2020, 10/24/2020   Pfizer Covid-19 Vaccine Bivalent Booster 70yr & up 04/07/2021, 10/19/2021   Pneumococcal Conjugate-13 07/06/2016   Pneumococcal Polysaccharide-23 11/19/2017   Zoster Recombinat (Shingrix) 02/10/2020  Zoster, Live 11/21/2011    Health Maintenance  Topic Date Due   Zoster Vaccines- Shingrix (2 of 2) 04/06/2020   COLONOSCOPY (Pts 45-21yr Insurance coverage will need to be confirmed)  09/27/2020   COVID-19 Vaccine (6 - Pfizer risk series) 12/14/2021    MAMMOGRAM  10/20/2022   TETANUS/TDAP  05/10/2024 (Originally 03/24/1970)   Medicare Annual Wellness (AWV)  11/07/2023   DEXA SCAN  03/15/2026   Pneumonia Vaccine 71 Years old  Completed   INFLUENZA VACCINE  Completed   Hepatitis C Screening  Completed   HPV VACCINES  Aged Out     Discussed health benefits of physical activity, and encouraged her to engage in regular exercise appropriate for her age and condition.    Problem List Items Addressed This Visit       Cardiovascular and Mediastinum   Hypertension    Well controlled Continue current medications Recheck metabolic panel F/u in 6 months       Relevant Orders   Comprehensive metabolic panel     Digestive   Constipation due to opioid therapy    Amitiza not covered Trial of linzess        Other   Hyperlipidemia    Previously well controlled Continue zetia Statin intolerant Repeat FLP and CMP      Relevant Orders   Comprehensive metabolic panel   Lipid panel   Overweight    Discussed importance of healthy weight management Discussed diet and exercise       Vitamin B12 deficiency    Continue monthly B12 injections Recheck level      Relevant Orders   B12   Prediabetes    Recommend low carb diet Recheck A1c       Relevant Orders   Hemoglobin A1c   Statin intolerance    H/o pancreatitis from statin - intolerant       Other Visit Diagnoses     Encounter for annual wellness visit (AWV) in Medicare patient    -  Primary   Relevant Orders   Flu Vaccine QUAD High Dose(Fluad) (Completed)   Encounter for annual physical exam       Relevant Orders   Comprehensive metabolic panel   Lipid panel   B12   Hemoglobin A1c   Need for influenza vaccination       Relevant Orders   Flu Vaccine QUAD High Dose(Fluad) (Completed)        Return in about 6 months (around 05/07/2023) for chronic disease f/u.     I, ALavon Paganini MD, have reviewed all documentation for this visit. The documentation on  11/06/22 for the exam, diagnosis, procedures, and orders are all accurate and complete.   Bacigalupo, ADionne Bucy MD, MPH [facility]

## 2022-11-06 ENCOUNTER — Ambulatory Visit (INDEPENDENT_AMBULATORY_CARE_PROVIDER_SITE_OTHER): Payer: Medicare Other | Admitting: Family Medicine

## 2022-11-06 ENCOUNTER — Encounter: Payer: Self-pay | Admitting: Family Medicine

## 2022-11-06 VITALS — BP 136/81 | HR 67 | Temp 98.0°F | Resp 16 | Ht 66.0 in | Wt 184.5 lb

## 2022-11-06 DIAGNOSIS — E663 Overweight: Secondary | ICD-10-CM

## 2022-11-06 DIAGNOSIS — R7303 Prediabetes: Secondary | ICD-10-CM

## 2022-11-06 DIAGNOSIS — T402X5A Adverse effect of other opioids, initial encounter: Secondary | ICD-10-CM

## 2022-11-06 DIAGNOSIS — Z0000 Encounter for general adult medical examination without abnormal findings: Secondary | ICD-10-CM | POA: Diagnosis not present

## 2022-11-06 DIAGNOSIS — I10 Essential (primary) hypertension: Secondary | ICD-10-CM | POA: Diagnosis not present

## 2022-11-06 DIAGNOSIS — E538 Deficiency of other specified B group vitamins: Secondary | ICD-10-CM

## 2022-11-06 DIAGNOSIS — K5903 Drug induced constipation: Secondary | ICD-10-CM | POA: Diagnosis not present

## 2022-11-06 DIAGNOSIS — E782 Mixed hyperlipidemia: Secondary | ICD-10-CM | POA: Diagnosis not present

## 2022-11-06 DIAGNOSIS — Z23 Encounter for immunization: Secondary | ICD-10-CM | POA: Diagnosis not present

## 2022-11-06 DIAGNOSIS — Z789 Other specified health status: Secondary | ICD-10-CM

## 2022-11-06 DIAGNOSIS — T466X5A Adverse effect of antihyperlipidemic and antiarteriosclerotic drugs, initial encounter: Secondary | ICD-10-CM | POA: Insufficient documentation

## 2022-11-06 MED ORDER — LINACLOTIDE 145 MCG PO CAPS: 145.0000 ug | ORAL_CAPSULE | Freq: Every day | ORAL | 1 refills | Status: DC

## 2022-11-06 NOTE — Assessment & Plan Note (Signed)
Well controlled Continue current medications Recheck metabolic panel F/u in 6 months  

## 2022-11-06 NOTE — Assessment & Plan Note (Signed)
Recommend low carb diet °Recheck A1c  °

## 2022-11-06 NOTE — Assessment & Plan Note (Signed)
Discussed importance of healthy weight management Discussed diet and exercise  

## 2022-11-06 NOTE — Assessment & Plan Note (Signed)
Continue monthly B12 injections Recheck level

## 2022-11-06 NOTE — Assessment & Plan Note (Signed)
Amitiza not covered Trial of linzess

## 2022-11-06 NOTE — Assessment & Plan Note (Signed)
H/o pancreatitis from statin - intolerant

## 2022-11-06 NOTE — Assessment & Plan Note (Signed)
Previously well controlled Continue zetia Statin intolerant Repeat FLP and CMP

## 2022-11-07 LAB — COMPREHENSIVE METABOLIC PANEL
ALT: 17 IU/L (ref 0–32)
AST: 22 IU/L (ref 0–40)
Albumin/Globulin Ratio: 1.6 (ref 1.2–2.2)
Albumin: 4.4 g/dL (ref 3.8–4.8)
Alkaline Phosphatase: 76 IU/L (ref 44–121)
BUN/Creatinine Ratio: 20 (ref 12–28)
BUN: 17 mg/dL (ref 8–27)
Bilirubin Total: 0.3 mg/dL (ref 0.0–1.2)
CO2: 20 mmol/L (ref 20–29)
Calcium: 9.2 mg/dL (ref 8.7–10.3)
Chloride: 101 mmol/L (ref 96–106)
Creatinine, Ser: 0.86 mg/dL (ref 0.57–1.00)
Globulin, Total: 2.8 g/dL (ref 1.5–4.5)
Glucose: 88 mg/dL (ref 70–99)
Potassium: 3.9 mmol/L (ref 3.5–5.2)
Sodium: 137 mmol/L (ref 134–144)
Total Protein: 7.2 g/dL (ref 6.0–8.5)
eGFR: 72 mL/min/{1.73_m2} (ref >59)

## 2022-11-07 LAB — LIPID PANEL
Chol/HDL Ratio: 4 ratio (ref 0.0–4.4)
Cholesterol, Total: 190 mg/dL (ref 100–199)
HDL: 47 mg/dL (ref >39)
LDL Chol Calc (NIH): 101 mg/dL — ABNORMAL HIGH (ref 0–99)
Triglycerides: 248 mg/dL — ABNORMAL HIGH (ref 0–149)
VLDL Cholesterol Cal: 42 mg/dL — ABNORMAL HIGH (ref 5–40)

## 2022-11-07 LAB — HEMOGLOBIN A1C
Est. average glucose Bld gHb Est-mCnc: 123 mg/dL
Hgb A1c MFr Bld: 5.9 % — ABNORMAL HIGH (ref 4.8–5.6)

## 2022-11-07 LAB — VITAMIN B12: Vitamin B-12: 483 pg/mL (ref 232–1245)

## 2022-11-13 ENCOUNTER — Other Ambulatory Visit: Payer: Self-pay | Admitting: Family Medicine

## 2022-11-14 ENCOUNTER — Ambulatory Visit (INDEPENDENT_AMBULATORY_CARE_PROVIDER_SITE_OTHER): Payer: Medicare Other | Admitting: Physician Assistant

## 2022-11-14 ENCOUNTER — Ambulatory Visit: Payer: Medicare Other

## 2022-11-14 DIAGNOSIS — E538 Deficiency of other specified B group vitamins: Secondary | ICD-10-CM | POA: Diagnosis not present

## 2022-11-14 MED ORDER — CYANOCOBALAMIN 1000 MCG/ML IJ SOLN
1000.0000 ug | Freq: Once | INTRAMUSCULAR | Status: AC
  Administered 2022-11-14: 1000 ug via INTRAMUSCULAR

## 2022-11-26 ENCOUNTER — Ambulatory Visit (INDEPENDENT_AMBULATORY_CARE_PROVIDER_SITE_OTHER): Payer: Medicare Other | Admitting: Family Medicine

## 2022-11-26 DIAGNOSIS — Z23 Encounter for immunization: Secondary | ICD-10-CM

## 2022-11-26 NOTE — Progress Notes (Signed)
Patient here for COVID vaccination only.  I did not examine the patient.  I did review his medical history, medications, and allergies and vaccine consent form.  CMA gave vaccination. Patient tolerated well.  Virginia Crews, MD, MPH [facility] 11/26/2022 3:05 PM

## 2022-12-12 ENCOUNTER — Ambulatory Visit (INDEPENDENT_AMBULATORY_CARE_PROVIDER_SITE_OTHER): Payer: Medicare Other | Admitting: Family Medicine

## 2022-12-12 DIAGNOSIS — E538 Deficiency of other specified B group vitamins: Secondary | ICD-10-CM | POA: Diagnosis not present

## 2022-12-12 MED ORDER — CYANOCOBALAMIN 1000 MCG/ML IJ SOLN
1000.0000 ug | Freq: Once | INTRAMUSCULAR | Status: AC
  Administered 2022-12-12: 1000 ug via INTRAMUSCULAR

## 2022-12-13 NOTE — Progress Notes (Signed)
B12 shot given.  

## 2022-12-20 ENCOUNTER — Other Ambulatory Visit: Payer: Self-pay | Admitting: Family Medicine

## 2023-01-09 ENCOUNTER — Ambulatory Visit (INDEPENDENT_AMBULATORY_CARE_PROVIDER_SITE_OTHER): Payer: Medicare Other

## 2023-01-09 DIAGNOSIS — E538 Deficiency of other specified B group vitamins: Secondary | ICD-10-CM

## 2023-01-09 MED ORDER — CYANOCOBALAMIN 1000 MCG/ML IJ SOLN
1000.0000 ug | Freq: Once | INTRAMUSCULAR | Status: AC
  Administered 2023-01-09: 1000 ug via INTRAMUSCULAR

## 2023-02-06 ENCOUNTER — Ambulatory Visit (INDEPENDENT_AMBULATORY_CARE_PROVIDER_SITE_OTHER): Payer: Medicare Other

## 2023-02-06 DIAGNOSIS — E538 Deficiency of other specified B group vitamins: Secondary | ICD-10-CM | POA: Diagnosis not present

## 2023-02-06 MED ORDER — CYANOCOBALAMIN 1000 MCG/ML IJ SOLN
1000.0000 ug | Freq: Once | INTRAMUSCULAR | Status: AC
  Administered 2023-02-06: 1000 ug via INTRAMUSCULAR

## 2023-02-10 ENCOUNTER — Encounter: Payer: Self-pay | Admitting: Family Medicine

## 2023-02-11 NOTE — Progress Notes (Unsigned)
   I,Kaniesha Barile S Kaydin Karbowski,acting as a Education administrator for [name], MD.,have documented all relevant documentation on the behalf of [name], MD,as directed by  [name], MD while in the presence of [name], MD.     Established patient visit   Patient: Shannon Grimes   DOB: 06-19-1951   72 y.o. Female  MRN: [id number] Visit Date: 02/12/2023  Today's healthcare provider: [name], MD   No chief complaint on file.  Subjective    HPI  ***  Medications: Outpatient Medications Prior to Visit  Medication Sig   estradiol (ESTRACE) 2 MG tablet TAKE 1 TABLET BY MOUTH DAILY   ezetimibe (ZETIA) 10 MG tablet TAKE 1 TABLET BY MOUTH DAILY   folic acid (FOLVITE) 1 MG tablet folic acid 1 mg tablet  TK 1 T PO QD   gabapentin (NEURONTIN) 400 MG capsule TAKE 1 CAPSULE BY MOUTH THREE TIMES A DAY   HYDROcodone Bitartrate ER 20 MG T24A Take 1 tablet by mouth daily.    ibuprofen (ADVIL) 200 MG tablet Take 200 mg by mouth every 6 (six) hours as needed.   inFLIXimab in sodium chloride 0.9 % Inject into the vein.   linaclotide (LINZESS) 145 MCG CAPS capsule Take 1 capsule (145 mcg total) by mouth daily before breakfast.   lisinopril-hydrochlorothiazide (ZESTORETIC) 10-12.5 MG tablet TAKE 1 TABLET BY MOUTH DAILY   methotrexate (RHEUMATREX) 2.5 MG tablet 2.5 mg. 5 tablets weekly   nortriptyline (PAMELOR) 50 MG capsule TAKE 1 CAPSULE BY MOUTH AT  BEDTIME   promethazine (PHENERGAN) 25 MG tablet TAKE 1 TABLET EVERY 4 HOURSAS NEEDED   SUMAtriptan (IMITREX) 100 MG tablet TAKE 1 TABLET AT ONSET, MAY REPEAT IN 2    HOURS IF HEADACHE PERSISTS OR RECURS   tiZANidine (ZANAFLEX) 4 MG tablet TAKE 1 TABLET TWICE A DAY   No facility-administered medications prior to visit.    Review of Systems  {Labs  Heme  Chem  Endocrine  Serology  Results Review (optional):23779}   Objective    There were no vitals taken for this visit. {Show previous vital signs  (optional):23777}  Physical Exam  ***  No results found for any visits on 02/12/23.  Assessment & Plan     ***  No follow-ups on file.      {provider attestation***:1}   [name], MD  [facility] [phone] (phone) [phone] (fax)  [place]

## 2023-02-12 ENCOUNTER — Encounter: Payer: Self-pay | Admitting: Family Medicine

## 2023-02-12 ENCOUNTER — Ambulatory Visit: Payer: Medicare Other | Admitting: Family Medicine

## 2023-02-12 VITALS — BP 111/74 | HR 94 | Temp 97.6°F | Resp 16 | Wt 180.0 lb

## 2023-02-12 DIAGNOSIS — M79622 Pain in left upper arm: Secondary | ICD-10-CM

## 2023-02-12 DIAGNOSIS — N644 Mastodynia: Secondary | ICD-10-CM

## 2023-02-19 ENCOUNTER — Ambulatory Visit
Admission: RE | Admit: 2023-02-19 | Discharge: 2023-02-19 | Disposition: A | Discharge status: Home / Self Care | Payer: Medicare Other | Source: Ambulatory Visit | Attending: Family Medicine | Admitting: Family Medicine

## 2023-02-19 DIAGNOSIS — N644 Mastodynia: Secondary | ICD-10-CM

## 2023-02-19 DIAGNOSIS — M79622 Pain in left upper arm: Secondary | ICD-10-CM

## 2023-03-06 ENCOUNTER — Ambulatory Visit (INDEPENDENT_AMBULATORY_CARE_PROVIDER_SITE_OTHER): Payer: Medicare Other

## 2023-03-06 ENCOUNTER — Encounter: Payer: Self-pay | Admitting: Family Medicine

## 2023-03-06 DIAGNOSIS — E538 Deficiency of other specified B group vitamins: Secondary | ICD-10-CM | POA: Diagnosis not present

## 2023-03-06 DIAGNOSIS — E782 Mixed hyperlipidemia: Secondary | ICD-10-CM

## 2023-03-06 MED ORDER — CYANOCOBALAMIN 1000 MCG/ML IJ SOLN
1000.0000 ug | Freq: Once | INTRAMUSCULAR | Status: AC
  Administered 2023-03-06: 1000 ug via INTRAMUSCULAR

## 2023-03-07 ENCOUNTER — Telehealth: Payer: Self-pay | Admitting: Family Medicine

## 2023-03-07 MED ORDER — LISINOPRIL-HYDROCHLOROTHIAZIDE 10-12.5 MG PO TABS: 1.0000 | ORAL_TABLET | Freq: Every day | ORAL | 1 refills | Status: DC

## 2023-03-07 MED ORDER — SUMATRIPTAN SUCCINATE 100 MG PO TABS: ORAL_TABLET | ORAL | 5 refills | Status: DC

## 2023-03-07 MED ORDER — NORTRIPTYLINE HCL 50 MG PO CAPS: 50.0000 mg | ORAL_CAPSULE | Freq: Every day | ORAL | 1 refills | Status: DC

## 2023-03-07 MED ORDER — PROMETHAZINE HCL 25 MG PO TABS: ORAL_TABLET | ORAL | 1 refills | Status: DC

## 2023-03-07 MED ORDER — ESTRADIOL 2 MG PO TABS: 2.0000 mg | ORAL_TABLET | Freq: Every day | ORAL | 1 refills | Status: DC

## 2023-03-07 MED ORDER — TIZANIDINE HCL 4 MG PO TABS: 4.0000 mg | ORAL_TABLET | Freq: Two times a day (BID) | ORAL | 2 refills | Status: DC

## 2023-03-07 MED ORDER — GABAPENTIN 400 MG PO CAPS: 400.0000 mg | ORAL_CAPSULE | Freq: Three times a day (TID) | ORAL | 3 refills | Status: DC

## 2023-03-07 MED ORDER — EZETIMIBE 10 MG PO TABS: 10.0000 mg | ORAL_TABLET | Freq: Every day | ORAL | 2 refills | Status: DC

## 2023-03-07 NOTE — Telephone Encounter (Signed)
Have sent the other refills request.

## 2023-03-07 NOTE — Telephone Encounter (Signed)
Error

## 2023-03-20 ENCOUNTER — Ambulatory Visit: Payer: Medicare Other

## 2023-04-03 ENCOUNTER — Ambulatory Visit (INDEPENDENT_AMBULATORY_CARE_PROVIDER_SITE_OTHER): Payer: Medicare Other

## 2023-04-03 DIAGNOSIS — E538 Deficiency of other specified B group vitamins: Secondary | ICD-10-CM | POA: Diagnosis not present

## 2023-04-03 MED ORDER — CYANOCOBALAMIN 1000 MCG/ML IJ SOLN
1000.0000 ug | Freq: Once | INTRAMUSCULAR | Status: AC
  Administered 2023-04-03: 1000 ug via INTRAMUSCULAR

## 2023-04-27 ENCOUNTER — Other Ambulatory Visit: Payer: Self-pay | Admitting: Physician Assistant

## 2023-04-27 DIAGNOSIS — E782 Mixed hyperlipidemia: Secondary | ICD-10-CM

## 2023-04-29 NOTE — Telephone Encounter (Signed)
Unable to refill per protocol, Rx request is too soon. Last refill 03/07/23 for 90 and 1 refill.  Requested Prescriptions  Pending Prescriptions Disp Refills   ezetimibe (ZETIA) 10 MG tablet [Pharmacy Med Name: EZETIMIBE 10 MG TABLET] 90 tablet 1    Sig: TAKE 1 TABLET BY MOUTH EVERY DAY     Cardiovascular:  Antilipid - Sterol Transport Inhibitors Failed - 04/27/2023  9:31 AM      Failed - Lipid Panel in normal range within the last 12 months    Cholesterol, Total  Date Value Ref Range Status  11/06/2022 190 100 - 199 mg/dL Final   LDL Cholesterol (Calc)  Date Value Ref Range Status  10/16/2017 132 (H) mg/dL (calc) Final    Comment:    Reference range: <100 . Desirable range <100 mg/dL for primary prevention;   <70 mg/dL for patients with CHD or diabetic patients  with > or = 2 CHD risk factors. Marland Kitchen LDL-C is now calculated using the Martin-Hopkins  calculation, which is a validated novel method providing  better accuracy than the Friedewald equation in the  estimation of LDL-C.  Horald Pollen et al. Lenox Ahr. 1610;960(45): 2061-2068  (http://education.[employer].com/faq/FAQ164)    LDL Chol Calc (NIH)  Date Value Ref Range Status  11/06/2022 101 (H) 0 - 99 mg/dL Final   HDL  Date Value Ref Range Status  11/06/2022 47 >39 mg/dL Final   Triglycerides  Date Value Ref Range Status  11/06/2022 248 (H) 0 - 149 mg/dL Final         Passed - AST in normal range and within 360 days    AST  Date Value Ref Range Status  11/06/2022 22 0 - 40 IU/L Final         Passed - ALT in normal range and within 360 days    ALT  Date Value Ref Range Status  11/06/2022 17 0 - 32 IU/L Final         Passed - Patient is not pregnant      Passed - Valid encounter within last 12 months    Recent Outpatient Visits           2 months ago Pain in left axilla   [place] [facility] [place], Marzella Schlein, MD   4 months ago Vitamin B12 deficiency   [facility] Caro Laroche, DO   5 months ago Need for COVID-19 vaccine   [facility] [place], Marzella Schlein, MD   5 months ago Encounter for annual wellness visit (AWV) in Medicare patient   [facility] [place], Marzella Schlein, MD   7 months ago Vitamin B12 deficiency   [place] [facility] [place], Tawanna Cooler, MD       Future Appointments             In 1 week Bacigalupo, Marzella Schlein, MD [facility]

## 2023-05-01 ENCOUNTER — Ambulatory Visit (INDEPENDENT_AMBULATORY_CARE_PROVIDER_SITE_OTHER): Payer: Medicare Other

## 2023-05-01 DIAGNOSIS — E538 Deficiency of other specified B group vitamins: Secondary | ICD-10-CM

## 2023-05-01 MED ORDER — CYANOCOBALAMIN 1000 MCG/ML IJ SOLN
1000.0000 ug | Freq: Once | INTRAMUSCULAR | Status: AC
  Administered 2023-05-01: 1000 ug via INTRAMUSCULAR

## 2023-05-07 ENCOUNTER — Encounter: Payer: Self-pay | Admitting: Family Medicine

## 2023-05-07 ENCOUNTER — Ambulatory Visit: Payer: Medicare Other | Admitting: Family Medicine

## 2023-05-07 VITALS — BP 113/71 | HR 76 | Temp 98.2°F | Resp 12 | Ht 68.5 in | Wt 182.0 lb

## 2023-05-07 DIAGNOSIS — E782 Mixed hyperlipidemia: Secondary | ICD-10-CM | POA: Diagnosis not present

## 2023-05-07 DIAGNOSIS — D510 Vitamin B12 deficiency anemia due to intrinsic factor deficiency: Secondary | ICD-10-CM

## 2023-05-07 DIAGNOSIS — D692 Other nonthrombocytopenic purpura: Secondary | ICD-10-CM

## 2023-05-07 DIAGNOSIS — R7303 Prediabetes: Secondary | ICD-10-CM

## 2023-05-07 DIAGNOSIS — G43109 Migraine with aura, not intractable, without status migrainosus: Secondary | ICD-10-CM

## 2023-05-07 DIAGNOSIS — I10 Essential (primary) hypertension: Secondary | ICD-10-CM | POA: Diagnosis not present

## 2023-05-07 DIAGNOSIS — N281 Cyst of kidney, acquired: Secondary | ICD-10-CM

## 2023-05-07 DIAGNOSIS — E538 Deficiency of other specified B group vitamins: Secondary | ICD-10-CM

## 2023-05-07 DIAGNOSIS — I700 Atherosclerosis of aorta: Secondary | ICD-10-CM

## 2023-05-07 DIAGNOSIS — L4050 Arthropathic psoriasis, unspecified: Secondary | ICD-10-CM

## 2023-05-07 MED ORDER — NORTRIPTYLINE HCL 25 MG PO CAPS: 25.0000 mg | ORAL_CAPSULE | Freq: Every day | ORAL | 1 refills | Status: DC

## 2023-05-07 NOTE — Assessment & Plan Note (Signed)
Chronic and stable Continue to monitor 

## 2023-05-07 NOTE — Assessment & Plan Note (Signed)
Previously well controlled Continue zetia Statin intolerant Repeat FLP and CMP 

## 2023-05-07 NOTE — Assessment & Plan Note (Signed)
Reviewed last CBC - well controlled

## 2023-05-07 NOTE — Progress Notes (Signed)
I,[name],acting as a Neurosurgeon for Shirlee Latch, MD.,have documented all relevant documentation on the behalf of Shirlee Latch, MD,as directed by  Shirlee Latch, MD while in the presence of Shirlee Latch, MD.     Established patient visit   Patient: Shannon Grimes   DOB: 15-Mar-1951   72 y.o. Female  MRN: [id number] Visit Date: 05/07/2023  Today's healthcare provider: Shirlee Latch, MD   Chief Complaint  Patient presents with   Hypertension   Subjective    HPI  Hypertension, follow-up  BP Readings from Last 3 Encounters:  05/07/23 113/71  02/12/23 111/74  11/06/22 136/81   Wt Readings from Last 3 Encounters:  05/07/23 182 lb (82.6 kg)  02/12/23 180 lb (81.6 kg)  11/06/22 184 lb 8 oz (83.7 kg)     She was last seen for hypertension 6 months ago.  BP at that visit was 136/81. Management since that visit includes Continue current medications .  She reports excellent compliance with treatment.  Outside blood pressures are stable.  Pertinent labs Lab Results  Component Value Date   CHOL 190 11/06/2022   HDL 47 11/06/2022   LDLCALC 101 (H) 11/06/2022   TRIG 248 (H) 11/06/2022   CHOLHDL 4.0 11/06/2022   Lab Results  Component Value Date   NA 137 11/06/2022   K 3.9 11/06/2022   CREATININE 0.86 11/06/2022   EGFR 72 11/06/2022   GLUCOSE 88 11/06/2022   TSH 1.88 11/19/2017     The 10-year ASCVD risk score (Arnett DK, et al., 2019) is: 12.3%  ---------------------------------------------------------------------------------------------------   Medications: Outpatient Medications Prior to Visit  Medication Sig   estradiol (ESTRACE) 2 MG tablet Take 1 tablet (2 mg total) by mouth daily.   ezetimibe (ZETIA) 10 MG tablet Take 1 tablet (10 mg total) by mouth daily.   folic acid (FOLVITE) 1 MG tablet folic acid 1 mg tablet  TK 1 T PO QD   gabapentin (NEURONTIN) 400 MG capsule Take 1 capsule (400 mg total) by mouth 3 (three) times daily.    inFLIXimab in sodium chloride 0.9 % Inject into the vein.   lisinopril-hydrochlorothiazide (ZESTORETIC) 10-12.5 MG tablet Take 1 tablet by mouth daily.   methotrexate (RHEUMATREX) 2.5 MG tablet 2.5 mg. 5 tablets weekly   oxyCODONE-acetaminophen (PERCOCET) 10-325 MG tablet Take 1 tablet by mouth 3 (three) times daily as needed for pain.   promethazine (PHENERGAN) 25 MG tablet TAKE 1 TABLET EVERY 4 HOURSAS NEEDED   SUMAtriptan (IMITREX) 100 MG tablet TAKE 1 TABLET AT ONSET, MAY REPEAT IN 2    HOURS IF HEADACHE PERSISTS OR RECURS   tiZANidine (ZANAFLEX) 4 MG tablet Take 1 tablet (4 mg total) by mouth 2 (two) times daily.   [DISCONTINUED] nortriptyline (PAMELOR) 50 MG capsule Take 1 capsule (50 mg total) by mouth at bedtime.   [DISCONTINUED] ibuprofen (ADVIL) 200 MG tablet Take 200 mg by mouth every 6 (six) hours as needed.   [DISCONTINUED] linaclotide (LINZESS) 145 MCG CAPS capsule Take 1 capsule (145 mcg total) by mouth daily before breakfast.   No facility-administered medications prior to visit.    Review of Systems  Constitutional:  Negative for appetite change.  Eyes:  Positive for visual disturbance.  Respiratory:  Negative for chest tightness and shortness of breath.   Cardiovascular:  Positive for leg swelling. Negative for chest pain.  Gastrointestinal:  Negative for abdominal pain, nausea and vomiting.  Musculoskeletal:  Positive for back pain and myalgias.  Neurological:  Negative for dizziness, light-headedness  and headaches.       Objective    BP 113/71 (BP Location: Left Arm, Patient Position: Sitting, Cuff Size: Large)   Pulse 76   Temp 98.2 F (36.8 C) (Temporal)   Resp 12   Ht 5' 8.5" (1.74 m)   Wt 182 lb (82.6 kg)   SpO2 99%   BMI 27.27 kg/m  BP Readings from Last 3 Encounters:  05/07/23 113/71  02/12/23 111/74  11/06/22 136/81   Wt Readings from Last 3 Encounters:  05/07/23 182 lb (82.6 kg)  02/12/23 180 lb (81.6 kg)  11/06/22 184 lb 8 oz (83.7 kg)      Physical Exam Vitals reviewed.  Constitutional:      General: She is not in acute distress.    Appearance: Normal appearance. She is well-developed. She is not diaphoretic.  HENT:     Head: Normocephalic and atraumatic.  Eyes:     General: No scleral icterus.    Conjunctiva/sclera: Conjunctivae normal.  Neck:     Thyroid: No thyromegaly.  Cardiovascular:     Rate and Rhythm: Normal rate and regular rhythm.     Pulses: Normal pulses.     Heart sounds: Normal heart sounds. No murmur heard. Pulmonary:     Effort: Pulmonary effort is normal. No respiratory distress.     Breath sounds: Normal breath sounds. No wheezing, rhonchi or rales.  Musculoskeletal:     Cervical back: Neck supple.     Right lower leg: No edema.     Left lower leg: No edema.  Lymphadenopathy:     Cervical: No cervical adenopathy.  Skin:    General: Skin is warm and dry.     Findings: No rash.  Neurological:     Mental Status: She is alert and oriented to person, place, and time. Mental status is at baseline.  Psychiatric:        Mood and Affect: Mood normal.        Behavior: Behavior normal.     No results found for any visits on 05/07/23.  Assessment & Plan     Problem List Items Addressed This Visit       Cardiovascular and Mediastinum   Migraines    Well controlled Will decrease nortriptyline to 25 mg qhs Try to taper to off at next visit      Relevant Medications   oxyCODONE-acetaminophen (PERCOCET) 10-325 MG tablet   nortriptyline (PAMELOR) 25 MG capsule   Hypertension - Primary    Well controlled Continue current medications Recheck metabolic panel F/u in 6 months       Relevant Orders   Comprehensive metabolic panel   Aortic atherosclerosis (HCC)    Continue risk factor management Statin intolerant      Senile purpura (HCC)    Chronic and stable Continue to monitor        Musculoskeletal and Integument   Psoriatic arthritis (HCC)    Stable, managed by Rheum Continue  MTX and folic acid      Relevant Medications   oxyCODONE-acetaminophen (PERCOCET) 10-325 MG tablet     Genitourinary   Renal cyst    Small bilateral noted incidentally on MRI L spine Will continue to monitor No red flags or indication to refer to Urology at this time        Other   Hyperlipidemia    Previously well controlled Continue zetia Statin intolerant Repeat FLP and CMP      Relevant Orders   Comprehensive metabolic panel  Lipid panel   Pernicious anemia    Reviewed last CBC - well controlled      Relevant Orders   B12   Vitamin B12 deficiency    Continue monthly B12 injections Recheck level      Relevant Orders   B12   Prediabetes    Recommend low carb diet Recheck A1c       Relevant Orders   Hemoglobin A1c     Return in about 6 months (around 11/07/2023) for CPE.      I, Shirlee Latch, MD, have reviewed all documentation for this visit. The documentation on 05/07/23 for the exam, diagnosis, procedures, and orders are all accurate and complete.   Jarica Plass, Marzella Schlein, MD, MPH [facility]

## 2023-05-07 NOTE — Assessment & Plan Note (Signed)
Continue monthly B12 injections Recheck level 

## 2023-05-07 NOTE — Assessment & Plan Note (Signed)
Continue risk factor management Statin intolerant 

## 2023-05-07 NOTE — Assessment & Plan Note (Signed)
Recommend low carb diet °Recheck A1c  °

## 2023-05-07 NOTE — Assessment & Plan Note (Signed)
Small bilateral noted incidentally on MRI L spine Will continue to monitor No red flags or indication to refer to Urology at this time

## 2023-05-07 NOTE — Assessment & Plan Note (Signed)
Well controlled Will decrease nortriptyline to 25 mg qhs Try to taper to off at next visit

## 2023-05-07 NOTE — Assessment & Plan Note (Signed)
Well controlled Continue current medications Recheck metabolic panel F/u in 6 months  

## 2023-05-07 NOTE — Assessment & Plan Note (Signed)
Stable, managed by Rheum Continue MTX and folic acid 

## 2023-05-08 LAB — COMPREHENSIVE METABOLIC PANEL
ALT: 22 IU/L (ref 0–32)
AST: 24 IU/L (ref 0–40)
Albumin/Globulin Ratio: 1.4 (ref 1.2–2.2)
Albumin: 4.3 g/dL (ref 3.8–4.8)
Alkaline Phosphatase: 75 IU/L (ref 44–121)
BUN/Creatinine Ratio: 21 (ref 12–28)
BUN: 21 mg/dL (ref 8–27)
Bilirubin Total: 0.3 mg/dL (ref 0.0–1.2)
CO2: 22 mmol/L (ref 20–29)
Calcium: 9.6 mg/dL (ref 8.7–10.3)
Chloride: 101 mmol/L (ref 96–106)
Creatinine, Ser: 1.01 mg/dL — ABNORMAL HIGH (ref 0.57–1.00)
Globulin, Total: 3 g/dL (ref 1.5–4.5)
Glucose: 94 mg/dL (ref 70–99)
Potassium: 4.6 mmol/L (ref 3.5–5.2)
Sodium: 139 mmol/L (ref 134–144)
Total Protein: 7.3 g/dL (ref 6.0–8.5)
eGFR: 59 mL/min/{1.73_m2} — ABNORMAL LOW (ref >59)

## 2023-05-08 LAB — LIPID PANEL
Chol/HDL Ratio: 3.8 ratio (ref 0.0–4.4)
Cholesterol, Total: 202 mg/dL — ABNORMAL HIGH (ref 100–199)
HDL: 53 mg/dL (ref >39)
LDL Chol Calc (NIH): 116 mg/dL — ABNORMAL HIGH (ref 0–99)
Triglycerides: 187 mg/dL — ABNORMAL HIGH (ref 0–149)
VLDL Cholesterol Cal: 33 mg/dL (ref 5–40)

## 2023-05-08 LAB — HEMOGLOBIN A1C
Est. average glucose Bld gHb Est-mCnc: 123 mg/dL
Hgb A1c MFr Bld: 5.9 % — ABNORMAL HIGH (ref 4.8–5.6)

## 2023-05-08 LAB — VITAMIN B12: Vitamin B-12: 783 pg/mL (ref 232–1245)

## 2023-05-10 ENCOUNTER — Telehealth: Payer: Self-pay

## 2023-05-10 DIAGNOSIS — I10 Essential (primary) hypertension: Secondary | ICD-10-CM

## 2023-05-10 NOTE — Telephone Encounter (Signed)
-----   Message from Erasmo Downer, MD sent at 05/09/2023  4:45 PM EDT ----- Normal/stable labs, except slightly elevated kidney function.  Be sure to hydrate well and decrease NSAIDs if taking any.  Recheck BMP in 1 month.

## 2023-05-29 ENCOUNTER — Ambulatory Visit (INDEPENDENT_AMBULATORY_CARE_PROVIDER_SITE_OTHER): Payer: Medicare Other

## 2023-05-29 DIAGNOSIS — E538 Deficiency of other specified B group vitamins: Secondary | ICD-10-CM | POA: Diagnosis not present

## 2023-05-29 MED ORDER — CYANOCOBALAMIN 1000 MCG/ML IJ SOLN
1000.0000 ug | Freq: Once | INTRAMUSCULAR | Status: AC
  Administered 2023-05-29: 1000 ug via INTRAMUSCULAR

## 2023-06-26 ENCOUNTER — Ambulatory Visit (INDEPENDENT_AMBULATORY_CARE_PROVIDER_SITE_OTHER): Payer: Medicare Other

## 2023-06-26 DIAGNOSIS — E538 Deficiency of other specified B group vitamins: Secondary | ICD-10-CM | POA: Diagnosis not present

## 2023-06-26 MED ORDER — CYANOCOBALAMIN 1000 MCG/ML IJ SOLN
1000.0000 ug | Freq: Once | INTRAMUSCULAR | Status: AC
  Administered 2023-06-26: 1000 ug via INTRAMUSCULAR

## 2023-06-27 LAB — BASIC METABOLIC PANEL
BUN/Creatinine Ratio: 20 (ref 12–28)
BUN: 18 mg/dL (ref 8–27)
CO2: 23 mmol/L (ref 20–29)
Calcium: 9.8 mg/dL (ref 8.7–10.3)
Chloride: 103 mmol/L (ref 96–106)
Creatinine, Ser: 0.88 mg/dL (ref 0.57–1.00)
Glucose: 86 mg/dL (ref 70–99)
Potassium: 4.4 mmol/L (ref 3.5–5.2)
Sodium: 142 mmol/L (ref 134–144)
eGFR: 70 mL/min/{1.73_m2} (ref >59)

## 2023-07-31 ENCOUNTER — Ambulatory Visit (INDEPENDENT_AMBULATORY_CARE_PROVIDER_SITE_OTHER): Payer: Medicare Other

## 2023-07-31 DIAGNOSIS — E538 Deficiency of other specified B group vitamins: Secondary | ICD-10-CM | POA: Diagnosis not present

## 2023-07-31 MED ORDER — CYANOCOBALAMIN 1000 MCG/ML IJ SOLN
1000.0000 ug | Freq: Once | INTRAMUSCULAR | Status: AC
  Administered 2023-07-31: 1000 ug via INTRAMUSCULAR

## 2023-08-22 ENCOUNTER — Other Ambulatory Visit: Payer: Self-pay | Admitting: Family Medicine

## 2023-08-28 ENCOUNTER — Ambulatory Visit: Payer: Medicare Other

## 2023-09-04 ENCOUNTER — Ambulatory Visit (INDEPENDENT_AMBULATORY_CARE_PROVIDER_SITE_OTHER): Payer: Medicare Other

## 2023-09-04 DIAGNOSIS — E538 Deficiency of other specified B group vitamins: Secondary | ICD-10-CM | POA: Diagnosis not present

## 2023-09-04 MED ORDER — CYANOCOBALAMIN 1000 MCG/ML IJ SOLN
1000.0000 ug | Freq: Once | INTRAMUSCULAR | Status: AC
  Administered 2023-09-04: 1000 ug via INTRAMUSCULAR

## 2023-10-02 ENCOUNTER — Ambulatory Visit (INDEPENDENT_AMBULATORY_CARE_PROVIDER_SITE_OTHER): Payer: Medicare Other

## 2023-10-02 DIAGNOSIS — E538 Deficiency of other specified B group vitamins: Secondary | ICD-10-CM

## 2023-10-02 MED ORDER — CYANOCOBALAMIN 1000 MCG/ML IJ SOLN
1000.0000 ug | Freq: Once | INTRAMUSCULAR | Status: AC
  Administered 2023-10-03: 1000 ug via INTRAMUSCULAR

## 2023-10-02 MED ORDER — "SYRINGE 25G X 5/8"" 3 ML MISC": 1.0000 | 1 refills | Status: DC

## 2023-10-02 MED ORDER — CYANOCOBALAMIN 1000 MCG/ML IJ SOLN: 1000.0000 ug | INTRAMUSCULAR | 1 refills | Status: DC

## 2023-10-03 DIAGNOSIS — E538 Deficiency of other specified B group vitamins: Secondary | ICD-10-CM | POA: Diagnosis not present

## 2023-10-13 ENCOUNTER — Other Ambulatory Visit: Payer: Self-pay | Admitting: Family Medicine

## 2023-10-27 ENCOUNTER — Other Ambulatory Visit: Payer: Self-pay | Admitting: Family Medicine

## 2023-11-08 ENCOUNTER — Encounter: Payer: Medicare Other | Admitting: Family Medicine

## 2023-11-13 ENCOUNTER — Other Ambulatory Visit: Payer: Self-pay | Admitting: Family Medicine

## 2023-11-13 DIAGNOSIS — E782 Mixed hyperlipidemia: Secondary | ICD-10-CM

## 2023-11-14 NOTE — Telephone Encounter (Signed)
Requested Prescriptions  Pending Prescriptions Disp Refills   ezetimibe (ZETIA) 10 MG tablet [Pharmacy Med Name: Ezetimibe 10 MG Oral Tablet] 90 tablet 0    Sig: TAKE 1 TABLET BY MOUTH DAILY     Cardiovascular:  Antilipid - Sterol Transport Inhibitors Failed - 11/13/2023 11:00 PM      Failed - Lipid Panel in normal range within the last 12 months    Cholesterol, Total  Date Value Ref Range Status  05/07/2023 202 (H) 100 - 199 mg/dL Final   LDL Cholesterol (Calc)  Date Value Ref Range Status  10/16/2017 132 (H) mg/dL (calc) Final    Comment:    Reference range: <100 . Desirable range <100 mg/dL for primary prevention;   <70 mg/dL for patients with CHD or diabetic patients  with > or = 2 CHD risk factors. Marland Kitchen LDL-C is now calculated using the Martin-Hopkins  calculation, which is a validated novel method providing  better accuracy than the Friedewald equation in the  estimation of LDL-C.  Horald Pollen et al. Lenox Ahr. 3557;322(02): 2061-2068  (http://education.[employer].com/faq/FAQ164)    LDL Chol Calc (NIH)  Date Value Ref Range Status  05/07/2023 116 (H) 0 - 99 mg/dL Final   HDL  Date Value Ref Range Status  05/07/2023 53 >39 mg/dL Final   Triglycerides  Date Value Ref Range Status  05/07/2023 187 (H) 0 - 149 mg/dL Final         Passed - AST in normal range and within 360 days    AST  Date Value Ref Range Status  05/07/2023 24 0 - 40 IU/L Final         Passed - ALT in normal range and within 360 days    ALT  Date Value Ref Range Status  05/07/2023 22 0 - 32 IU/L Final         Passed - Patient is not pregnant      Passed - Valid encounter within last 12 months    Recent Outpatient Visits           6 months ago Primary hypertension   [facility] [place], Marzella Schlein, MD   9 months ago Pain in left axilla   [facility] [place], Marzella Schlein, MD   11 months ago Vitamin B12 deficiency   [facility] Caro Laroche, DO   11 months ago Need for COVID-19 vaccine   [facility], Marzella Schlein, MD   1 year ago Encounter for annual wellness visit (AWV) in Medicare patient   [facility] [place], Marzella Schlein, MD       Future Appointments             In 3 weeks Bacigalupo, Marzella Schlein, MD [facility]

## 2023-12-10 ENCOUNTER — Encounter: Payer: Medicare Other | Admitting: Family Medicine

## 2024-01-02 ENCOUNTER — Ambulatory Visit: Payer: Medicare Other | Admitting: Family Medicine

## 2024-01-02 ENCOUNTER — Encounter: Payer: Self-pay | Admitting: Family Medicine

## 2024-01-02 VITALS — BP 128/72 | HR 75 | Ht 68.0 in | Wt 182.3 lb

## 2024-01-02 DIAGNOSIS — Z789 Other specified health status: Secondary | ICD-10-CM

## 2024-01-02 DIAGNOSIS — E538 Deficiency of other specified B group vitamins: Secondary | ICD-10-CM

## 2024-01-02 DIAGNOSIS — L4050 Arthropathic psoriasis, unspecified: Secondary | ICD-10-CM | POA: Diagnosis not present

## 2024-01-02 DIAGNOSIS — D510 Vitamin B12 deficiency anemia due to intrinsic factor deficiency: Secondary | ICD-10-CM

## 2024-01-02 DIAGNOSIS — I10 Essential (primary) hypertension: Secondary | ICD-10-CM | POA: Diagnosis not present

## 2024-01-02 DIAGNOSIS — Z0000 Encounter for general adult medical examination without abnormal findings: Secondary | ICD-10-CM

## 2024-01-02 DIAGNOSIS — E785 Hyperlipidemia, unspecified: Secondary | ICD-10-CM | POA: Diagnosis not present

## 2024-01-02 DIAGNOSIS — Z1231 Encounter for screening mammogram for malignant neoplasm of breast: Secondary | ICD-10-CM

## 2024-01-02 DIAGNOSIS — E782 Mixed hyperlipidemia: Secondary | ICD-10-CM

## 2024-01-02 DIAGNOSIS — R7303 Prediabetes: Secondary | ICD-10-CM

## 2024-01-02 DIAGNOSIS — I700 Atherosclerosis of aorta: Secondary | ICD-10-CM

## 2024-01-02 DIAGNOSIS — D692 Other nonthrombocytopenic purpura: Secondary | ICD-10-CM

## 2024-01-02 DIAGNOSIS — Z1211 Encounter for screening for malignant neoplasm of colon: Secondary | ICD-10-CM

## 2024-01-02 NOTE — Assessment & Plan Note (Signed)
Chronic and stable Continue to monitor 

## 2024-01-02 NOTE — Assessment & Plan Note (Signed)
 Elevated blood pressure readings at home, likely secondary to chronic pain. Morning readings are within normal range. Discussed impact of chronic pain on blood pressure and potential need to adjust antihypertensive medication if consistently elevated. - Monitor blood pressure at home - Consider increasing antihypertensive medication if consistently elevated

## 2024-01-02 NOTE — Patient Instructions (Signed)
 Call [facility] to schedule a mammogram [phone]

## 2024-01-02 NOTE — Assessment & Plan Note (Signed)
Continue risk factor management Statin intolerant 

## 2024-01-02 NOTE — Assessment & Plan Note (Signed)
 Recommend low carb diet Recheck A1c

## 2024-01-02 NOTE — Progress Notes (Signed)
 Annual Wellness Visit     Patient: Shannon Grimes, Female    DOB: [birth year], 73 y.o.   MRN: [id number] Visit Date: 01/02/2024  Today's Provider: Jon Eva, MD   Chief Complaint  Patient presents with   Annual Exam    Diet: General, well balanced Exercise: pool twice a week for physical therapy for at least 30 minutes Feeling: well Sleeping: poorly Concerns: none   Subjective    Shannon Grimes is a 73 y.o. female who presents today for her Annual Wellness Visit.  Discussed the use of AI scribe software for clinical note transcription with the patient, who gave verbal consent to proceed.  History of Present Illness   The patient, with a history of psoriatic arthritis and orthopedic issues, presents with multiple complaints. The patient reports worsening orthopedic pain, particularly in the hip, which has been operated on six times. The patient also reports a recent C spine surgery which went well. However, the patient is due for another surgery to address fractures at T11 and T12. The patient is currently on Cimzia for psoriatic arthritis and reports no significant improvement yet.  The patient also reports high blood pressure, which she attributes to pain. The patient's blood pressure is sporadic, with normal readings in the morning and elevated readings later in the day. The patient is also concerned about hair thinning, which she attributes to methotrexate, a medication she was previously on. The patient also reports a skin condition, with small blisters appearing in areas where she previously had psoriasis.  The patient is due for a colonoscopy and mammogram, but has been delaying these due to her other health issues. The patient has a family history of colon cancer and is considered high risk. The patient is also due for a flu shot and COVID booster, but has been advised to delay these due to her current medication regimen.              Medications: Outpatient Medications Prior to Visit  Medication Sig   Certolizumab Pegol (CIMZIA [place]) Inject into the skin.   cyanocobalamin  (VITAMIN B12) 1000 MCG/ML injection Inject 1 mL (1,000 mcg total) into the skin every 30 (thirty) days.   estradiol  (ESTRACE ) 2 MG tablet TAKE 1 TABLET BY MOUTH DAILY   ezetimibe  (ZETIA ) 10 MG tablet TAKE 1 TABLET BY MOUTH DAILY   gabapentin  (NEURONTIN ) 400 MG capsule Take 1 capsule (400 mg total) by mouth 3 (three) times daily.   lisinopril -hydrochlorothiazide  (ZESTORETIC ) 10-12.5 MG tablet TAKE 1 TABLET BY MOUTH DAILY   nortriptyline  (PAMELOR ) 25 MG capsule TAKE 1 CAPSULE BY MOUTH AT BEDTIME.   oxyCODONE-acetaminophen (PERCOCET) 10-325 MG tablet Take 1 tablet by mouth 3 (three) times daily as needed for pain.   promethazine  (PHENERGAN ) 25 MG tablet TAKE 1 TABLET EVERY 4 HOURSAS NEEDED   SUMAtriptan  (IMITREX ) 100 MG tablet TAKE 1 TABLET AT ONSET, MAY REPEAT IN 2    HOURS IF HEADACHE PERSISTS OR RECURS   Syringe/Needle, Disp, (SYRINGE 3CC/25GX5/8) 25G X 5/8 3 ML MISC 1 each by Does not apply route every 30 (thirty) days.   tiZANidine  (ZANAFLEX ) 4 MG tablet Take 1 tablet (4 mg total) by mouth 2 (two) times daily.   [DISCONTINUED] folic acid (FOLVITE) 1 MG tablet folic acid 1 mg tablet  TK 1 T PO QD   [DISCONTINUED] inFLIXimab  in sodium chloride  0.9 % Inject into the vein.   [DISCONTINUED] methotrexate (RHEUMATREX) 2.5 MG tablet 2.5 mg. 5 tablets weekly   No facility-administered  medications prior to visit.    Allergies  Allergen Reactions   Pentazocine Anaphylaxis   Statins Other (See Comments)    pancreatitis   Morphine Nausea And Vomiting    And migraine    Patient Care Team: Myrla Jon HERO, MD as PCP - General (Family Medicine) Jenell Rome BIRCH, DO as Referring Physician (Physical Medicine and Rehabilitation) Therisa Bi, MD as Consulting Physician (Gastroenterology) Noland Camellia ORN, MD as Referring Physician  (Rheumatology) Rutherford, Charlie ORN Raddle., MD as Referring Physician (Orthopedic Surgery) Myrna Adine Anes, MD as Consulting Physician (Ophthalmology)  Review of Systems       Objective    Vitals: BP 128/72 Comment: home reading - AM  Pulse 75   Ht 5' 8 (1.727 m)   Wt 182 lb 4.8 oz (82.7 kg)   SpO2 100%   BMI 27.72 kg/m      Physical Exam Vitals reviewed.  Constitutional:      General: She is not in acute distress.    Appearance: Normal appearance. She is well-developed. She is not diaphoretic.  HENT:     Head: Normocephalic and atraumatic.     Nose:     Comments: Erythematous turbinates, no active bleeding Eyes:     General: No scleral icterus.    Conjunctiva/sclera: Conjunctivae normal.  Neck:     Thyroid : No thyromegaly.  Cardiovascular:     Rate and Rhythm: Normal rate and regular rhythm.     Heart sounds: Normal heart sounds. No murmur heard. Pulmonary:     Effort: Pulmonary effort is normal. No respiratory distress.     Breath sounds: Normal breath sounds. No wheezing, rhonchi or rales.  Musculoskeletal:     Cervical back: Neck supple.     Right lower leg: No edema.     Left lower leg: No edema.  Lymphadenopathy:     Cervical: No cervical adenopathy.  Skin:    General: Skin is warm and dry.  Neurological:     Mental Status: She is alert and oriented to person, place, and time. Mental status is at baseline.  Psychiatric:        Mood and Affect: Mood normal.        Behavior: Behavior normal.     Most recent functional status assessment:    01/02/2024    2:31 PM  In your present state of health, do you have any difficulty performing the following activities:  Hearing? 0  Vision? 1  Difficulty concentrating or making decisions? 0  Walking or climbing stairs? 1  Dressing or bathing? 1  Doing errands, shopping? 1  Preparing Food and eating ? N  Using the Toilet? N  In the past six months, have you accidently leaked urine? N  Do you have problems  with loss of bowel control? N  Managing your Medications? N  Managing your Finances? N   Most recent fall risk assessment:    01/02/2024    2:35 PM  Fall Risk   Falls in the past year? 0  Number falls in past yr: 0  Injury with Fall? 0  Risk for fall due to : No Fall Risks  Follow up Falls evaluation completed    Most recent depression screenings:    01/02/2024    2:35 PM 05/07/2023   10:07 AM  PHQ 2/9 Scores  PHQ - 2 Score 1 1  PHQ- 9 Score  7   Most recent cognitive screening:    01/02/2024    2:36 PM  6CIT  Screen  What Year? 0 points  What month? 0 points  What time? 0 points  Count back from 20 0 points  Months in reverse 0 points  Repeat phrase 0 points  Total Score 0 points   Most recent Audit-C alcohol use screening    12/29/2023   11:50 AM  Alcohol Use Disorder Test (AUDIT)  1. How often do you have a drink containing alcohol? 0   A score of 3 or more in women, and 4 or more in men indicates increased risk for alcohol abuse, EXCEPT if all of the points are from question 1   No results found.  No results found for any visits on 01/02/24.  Assessment & Plan     Annual wellness visit done today including the all of the following: Reviewed patient's Family Medical History Reviewed and updated list of patient's medical providers Assessment of cognitive impairment was done Assessed patient's functional ability Established a written schedule for health screening services Health Risk Assessent Completed and Reviewed  Exercise Activities and Dietary recommendations  Goals      DIET - REDUCE SUGAR INTAKE     Recommend cutting back on desserts and sugars in daily diet. Pt is cutting back to eating 3 sweets a week.      LIFESTYLE - DECREASE FALLS RISK     Recommend to remove any items from the home that may cause slips or trips.        Immunization History  Administered Date(s) Administered   Fluad Quad(high Dose 65+) 10/21/2019, 11/08/2020, 10/19/2021,  11/06/2022   Influenza, High Dose Seasonal PF 10/24/2017, 10/28/2018   PFIZER Comirnaty (Gray Top)Covid-19 Tri-Sucrose Vaccine 11/26/2022   PFIZER(Purple Top)SARS-COV-2 Vaccination 04/01/2020, 04/22/2020, 10/24/2020   Pfizer Covid-19 Vaccine Bivalent Booster 72yrs & up 04/07/2021, 10/19/2021   Pneumococcal Conjugate-13 07/06/2016   Pneumococcal Polysaccharide-23 11/19/2017   Respiratory Syncytial Virus Vaccine,Recomb Aduvanted(Arexvy) 01/14/2023   Zoster Recombinant(Shingrix) 02/10/2020   Zoster, Live 11/21/2011    Health Maintenance  Topic Date Due   DTaP/Tdap/Td (1 - Tdap) Never done   Zoster Vaccines- Shingrix (2 of 2) 04/06/2020   Colonoscopy  09/27/2020   COVID-19 Vaccine (7 - 2024-25 season) 08/25/2023   INFLUENZA VACCINE  03/23/2024 (Originally 07/25/2023)   Medicare Annual Wellness (AWV)  01/01/2025   MAMMOGRAM  02/19/2025   DEXA SCAN  03/15/2026   Pneumonia Vaccine 67+ Years old  Completed   Hepatitis C Screening  Completed   HPV VACCINES  Aged Out     Discussed health benefits of physical activity, and encouraged her to engage in regular exercise appropriate for her age and condition.    Problem List Items Addressed This Visit       Cardiovascular and Mediastinum   Hypertension   Elevated blood pressure readings at home, likely secondary to chronic pain. Morning readings are within normal range. Discussed impact of chronic pain on blood pressure and potential need to adjust antihypertensive medication if consistently elevated. - Monitor blood pressure at home - Consider increasing antihypertensive medication if consistently elevated      Relevant Orders   Comprehensive metabolic panel   Aortic atherosclerosis (HCC)   Continue risk factor management Statin intolerant      Senile purpura (HCC)   Chronic and stable Continue to monitor        Musculoskeletal and Integument   Psoriatic arthritis (HCC)   Stable, managed by Rheum        Other    Hyperlipidemia   Previously well controlled Continue  zetia  Statin intolerant Repeat FLP and CMP      Relevant Orders   Lipid panel   Comprehensive metabolic panel   Pernicious anemia   Relevant Orders   B12   CBC w/Diff/Platelet   Vitamin B12 deficiency   Relevant Orders   B12   Prediabetes   Recommend low carb diet Recheck A1c       Relevant Orders   Hemoglobin A1c   Statin intolerance   Other Visit Diagnoses       Encounter for annual wellness visit (AWV) in Medicare patient    -  Primary     Encounter for annual physical exam         Colon cancer screening       Relevant Orders   Cologuard     Breast cancer screening by mammogram       Relevant Orders   MM 3D SCREENING MAMMOGRAM BILATERAL BREAST           Orthopedic Pain Chronic hip and spine pain, exacerbated by cold weather. History of multiple surgeries, including C-spine surgery in September. MRI scheduled for January 17th to assess worsening fractures at T11 and T12. Current pain management with hydrocodone-acetaminophen is insufficient. Discussed potential for further surgical interventions, possibly combining thoracic and lumbar procedures due to good healing and compliance. - Continue hydrocodone-acetaminophen for pain management and management per Ortho/Neurosurg  Psoriatic Arthritis Intolerance to methotrexate due to side effects, currently on Cimzia with loading doses completed in December. No significant improvement noted yet. Discussed need for patience as response times vary.  - continue management per Rheum  Epistaxis Recurrent epistaxis and nasal dryness, possibly exacerbated by recent cold and use of Neosporin. Switched to Vaseline for nasal lubrication. Discussed potential allergic reaction to Neosporin. - Examine nasal passages - Continue using Vaseline for nasal lubrication  B12 Deficiency Difficulty with self-administration of B12 injections. Last injection given by a neighbor in  December. Improving  General Health Maintenance Due for routine screenings and vaccinations. Mammogram due in February/March. Flu shot and updated COVID booster delayed due to Cimzia loading doses. Discussed importance of vaccinations and need to consult with Dr. Tobie regarding timing. - Order mammogram for February/March - Send Cologuard kit for colorectal cancer screening - not ideal due to fam history, but patient not willing to get C-scope at this time - Check with Dr. Tobie regarding timing for flu shot and COVID booster  Follow-up - Schedule follow-up appointment in six months - Perform lab tests today including cholesterol, kidney and liver function, blood counts, B12, and A1c.        Return in about 6 months (around 07/01/2024) for chronic disease f/u.     Jon Eva, MD  [facility] [phone] (phone) [phone] (fax)  [facility]

## 2024-01-02 NOTE — Assessment & Plan Note (Signed)
Previously well controlled Continue zetia Statin intolerant Repeat FLP and CMP 

## 2024-01-02 NOTE — Assessment & Plan Note (Signed)
 Stable, managed by Rheum

## 2024-01-03 LAB — LIPID PANEL
Chol/HDL Ratio: 4.9 {ratio} — ABNORMAL HIGH (ref 0.0–4.4)
Cholesterol, Total: 211 mg/dL — ABNORMAL HIGH (ref 100–199)
HDL: 43 mg/dL (ref >39)
LDL Chol Calc (NIH): 118 mg/dL — ABNORMAL HIGH (ref 0–99)
Triglycerides: 286 mg/dL — ABNORMAL HIGH (ref 0–149)
VLDL Cholesterol Cal: 50 mg/dL — ABNORMAL HIGH (ref 5–40)

## 2024-01-03 LAB — CBC WITH DIFFERENTIAL/PLATELET
Basophils Absolute: 0.1 10*3/uL (ref 0.0–0.2)
Basos: 1 %
EOS (ABSOLUTE): 0.4 10*3/uL (ref 0.0–0.4)
Eos: 5 %
Hematocrit: 43.4 % (ref 34.0–46.6)
Hemoglobin: 14.5 g/dL (ref 11.1–15.9)
Immature Grans (Abs): 0 10*3/uL (ref 0.0–0.1)
Immature Granulocytes: 0 %
Lymphocytes Absolute: 2.7 10*3/uL (ref 0.7–3.1)
Lymphs: 36 %
MCH: 30 pg (ref 26.6–33.0)
MCHC: 33.4 g/dL (ref 31.5–35.7)
MCV: 90 fL (ref 79–97)
Monocytes Absolute: 0.6 10*3/uL (ref 0.1–0.9)
Monocytes: 8 %
Neutrophils Absolute: 3.8 10*3/uL (ref 1.4–7.0)
Neutrophils: 50 %
Platelets: 209 10*3/uL (ref 150–450)
RBC: 4.83 x10E6/uL (ref 3.77–5.28)
RDW: 13.2 % (ref 11.7–15.4)
WBC: 7.6 10*3/uL (ref 3.4–10.8)

## 2024-01-03 LAB — COMPREHENSIVE METABOLIC PANEL
ALT: 18 [IU]/L (ref 0–32)
AST: 24 [IU]/L (ref 0–40)
Albumin: 4.3 g/dL (ref 3.8–4.8)
Alkaline Phosphatase: 80 [IU]/L (ref 44–121)
BUN/Creatinine Ratio: 20 (ref 12–28)
BUN: 18 mg/dL (ref 8–27)
Bilirubin Total: 0.2 mg/dL (ref 0.0–1.2)
CO2: 23 mmol/L (ref 20–29)
Calcium: 9.5 mg/dL (ref 8.7–10.3)
Chloride: 102 mmol/L (ref 96–106)
Creatinine, Ser: 0.92 mg/dL (ref 0.57–1.00)
Globulin, Total: 3.6 g/dL (ref 1.5–4.5)
Glucose: 97 mg/dL (ref 70–99)
Potassium: 4.5 mmol/L (ref 3.5–5.2)
Sodium: 141 mmol/L (ref 134–144)
Total Protein: 7.9 g/dL (ref 6.0–8.5)
eGFR: 66 mL/min/{1.73_m2} (ref >59)

## 2024-01-03 LAB — HEMOGLOBIN A1C
Est. average glucose Bld gHb Est-mCnc: 128 mg/dL
Hgb A1c MFr Bld: 6.1 % — ABNORMAL HIGH (ref 4.8–5.6)

## 2024-01-03 LAB — VITAMIN B12: Vitamin B-12: 515 pg/mL (ref 232–1245)

## 2024-01-06 ENCOUNTER — Other Ambulatory Visit: Payer: Self-pay | Admitting: Family Medicine

## 2024-01-08 NOTE — Telephone Encounter (Signed)
 Requested Prescriptions  Pending Prescriptions Disp Refills   gabapentin  (NEURONTIN ) 400 MG capsule [Pharmacy Med Name: Gabapentin  400 MG Oral Capsule] 270 capsule 3    Sig: TAKE 1 CAPSULE BY MOUTH 3 TIMES  DAILY     Neurology: Anticonvulsants - gabapentin  Passed - 01/08/2024  8:13 AM      Passed - Cr in normal range and within 360 days    Creat  Date Value Ref Range Status  05/06/2019 0.9  Final   Creatinine, Ser  Date Value Ref Range Status  01/02/2024 0.92 0.57 - 1.00 mg/dL Final         Passed - Completed PHQ-2 or PHQ-9 in the last 360 days      Passed - Valid encounter within last 12 months    Recent Outpatient Visits           6 days ago Encounter for annual wellness visit (AWV) in Medicare patient   [place] [facility] Gratz, Stan Eans, MD   8 months ago Primary hypertension   [facility] Mazie Speed, MD   11 months ago Pain in left axilla   [place] Guy, Stan Eans, MD   1 year ago Vitamin B12 deficiency   [facility] Kandis Ormond, DO   1 year ago Need for COVID-19 vaccine   [facility] Bacigalupo, Stan Eans, MD       Future Appointments             In 5 months Bacigalupo, Stan Eans, MD [facility]

## 2024-01-15 ENCOUNTER — Other Ambulatory Visit: Payer: Self-pay | Admitting: Family Medicine

## 2024-01-15 ENCOUNTER — Ambulatory Visit: Payer: Self-pay

## 2024-01-15 DIAGNOSIS — E782 Mixed hyperlipidemia: Secondary | ICD-10-CM

## 2024-01-15 NOTE — Telephone Encounter (Signed)
    Chief Complaint: Left ear pain with drainage. Symptoms: Above Frequency: End of December, getting worse. Pertinent Negatives: Patient denies fever Disposition: [] ED /[] Urgent Care (no appt availability in office) / [x] Appointment(In office/virtual)/ []  [place] [facility]/ [] Home Care/ [] Refused Recommended Disposition /[] [place] [facility]/ []  Follow-up with PCP Additional Notes: Agrees with appointment.  Reason for Disposition  White, yellow, or green discharge  Answer Assessment - Initial Assessment Questions 1. LOCATION: "Which ear is involved?"     Left 2. ONSET: "When did the ear start hurting"      End of December 3. SEVERITY: "How bad is the pain?"  (Scale 1-10; mild, moderate or severe)   - MILD (1-3): doesn't interfere with normal activities    - MODERATE (4-7): interferes with normal activities or awakens from sleep    - SEVERE (8-10): excruciating pain, unable to do any normal activities      5 4. URI SYMPTOMS: "Do you have a runny nose or cough?"     No 5. FEVER: "Do you have a fever?" If Yes, ask: "What is your temperature, how was it measured, and when did it start?"     No 6. CAUSE: "Have you been swimming recently?", "How often do you use Q-TIPS?", "Have you had any recent air travel or scuba diving?"     No 7. OTHER SYMPTOMS: "Do you have any other symptoms?" (e.g., headache, stiff neck, dizziness, vomiting, runny nose, decreased hearing)     Clear drainage 8. PREGNANCY: "Is there any chance you are pregnant?" "When was your last menstrual period?"     No  Protocols used: Davina Poke

## 2024-01-16 ENCOUNTER — Encounter: Payer: Self-pay | Admitting: Family Medicine

## 2024-01-16 ENCOUNTER — Ambulatory Visit: Payer: Medicare Other | Admitting: Family Medicine

## 2024-01-16 VITALS — BP 137/63 | HR 72 | Ht 68.0 in | Wt 184.0 lb

## 2024-01-16 DIAGNOSIS — H66012 Acute suppurative otitis media with spontaneous rupture of ear drum, left ear: Secondary | ICD-10-CM

## 2024-01-16 MED ORDER — AMOXICILLIN-POT CLAVULANATE 875-125 MG PO TABS: 1.0000 | ORAL_TABLET | Freq: Two times a day (BID) | ORAL | 0 refills | Status: DC

## 2024-01-16 NOTE — Progress Notes (Unsigned)
      Established patient visit   Patient: Shannon Grimes   DOB: [birth year]   73 y.o. Female  MRN: [id number] Visit Date: 01/16/2024  Today's healthcare provider: Ronnald Ramp, MD   No chief complaint on file.  Subjective       Discussed the use of AI scribe software for clinical note transcription with the patient, who gave verbal consent to proceed.  History of Present Illness             Past Medical History:  Diagnosis Date   Allergy    Anemia    pernicious   Avascular necrosis (HCC)    right hip   Blood transfusion without reported diagnosis    after hysterectomy   Carpal tunnel syndrome on right    Cubital tunnel syndrome on right    GERD (gastroesophageal reflux disease)    Hyperlipidemia    Hypertension    Migraine headache    3-4x/month   Neuromuscular disorder (HCC)    multiple ortho issues with abnormal EMGs   Osteoarthritis    PONV (postoperative nausea and vomiting)    also triggers migraines    Medications: Outpatient Medications Prior to Visit  Medication Sig   Certolizumab Pegol (CIMZIA [place]) Inject into the skin.   cyanocobalamin (VITAMIN B12) 1000 MCG/ML injection Inject 1 mL (1,000 mcg total) into the skin every 30 (thirty) days.   estradiol (ESTRACE) 2 MG tablet TAKE 1 TABLET BY MOUTH DAILY   ezetimibe (ZETIA) 10 MG tablet TAKE 1 TABLET BY MOUTH DAILY   gabapentin (NEURONTIN) 400 MG capsule TAKE 1 CAPSULE BY MOUTH 3 TIMES  DAILY   lisinopril-hydrochlorothiazide (ZESTORETIC) 10-12.5 MG tablet TAKE 1 TABLET BY MOUTH DAILY   nortriptyline (PAMELOR) 25 MG capsule TAKE 1 CAPSULE BY MOUTH AT BEDTIME.   oxyCODONE-acetaminophen (PERCOCET) 10-325 MG tablet Take 1 tablet by mouth 3 (three) times daily as needed for pain.   promethazine (PHENERGAN) 25 MG tablet TAKE 1 TABLET EVERY 4 HOURSAS NEEDED   SUMAtriptan (IMITREX) 100 MG tablet TAKE 1 TABLET AT ONSET, MAY REPEAT IN 2    HOURS IF HEADACHE PERSISTS OR RECURS   Syringe/Needle, Disp,  (SYRINGE 3CC/25GX5/8") 25G X 5/8" 3 ML MISC 1 each by Does not apply route every 30 (thirty) days.   tiZANidine (ZANAFLEX) 4 MG tablet Take 1 tablet (4 mg total) by mouth 2 (two) times daily.   No facility-administered medications prior to visit.    Review of Systems  {Insert previous labs (optional):23779} {See past labs  Heme  Chem  Endocrine  Serology  Results Review (optional):1}   Objective    There were no vitals taken for this visit. {Insert last BP/Wt (optional):23777}{See vitals history (optional):1}    Physical Exam  ***  No results found for any visits on 01/16/24.  Assessment & Plan     Problem List Items Addressed This Visit   None   Assessment and Plan              No follow-ups on file.         Ronnald Ramp, MD  [facility] [phone] (phone) [phone] (fax)  [facility]

## 2024-01-16 NOTE — Telephone Encounter (Signed)
Requested Prescriptions  Pending Prescriptions Disp Refills   ezetimibe (ZETIA) 10 MG tablet [Pharmacy Med Name: Ezetimibe 10 MG Oral Tablet] 90 tablet 3    Sig: TAKE 1 TABLET BY MOUTH DAILY     Cardiovascular:  Antilipid - Sterol Transport Inhibitors Failed - 01/16/2024  8:22 AM      Failed - Lipid Panel in normal range within the last 12 months    Cholesterol, Total  Date Value Ref Range Status  01/02/2024 211 (H) 100 - 199 mg/dL Final   LDL Cholesterol (Calc)  Date Value Ref Range Status  10/16/2017 132 (H) mg/dL (calc) Final    Comment:    Reference range: <100 . Desirable range <100 mg/dL for primary prevention;   <70 mg/dL for patients with CHD or diabetic patients  with > or = 2 CHD risk factors. Marland Kitchen LDL-C is now calculated using the Martin-Hopkins  calculation, which is a validated novel method providing  better accuracy than the Friedewald equation in the  estimation of LDL-C.  Horald Pollen et al. Lenox Ahr. 5188;416(60): 2061-2068  (http://education.[employer].com/faq/FAQ164)    LDL Chol Calc (NIH)  Date Value Ref Range Status  01/02/2024 118 (H) 0 - 99 mg/dL Final   HDL  Date Value Ref Range Status  01/02/2024 43 >39 mg/dL Final   Triglycerides  Date Value Ref Range Status  01/02/2024 286 (H) 0 - 149 mg/dL Final         Passed - AST in normal range and within 360 days    AST  Date Value Ref Range Status  01/02/2024 24 0 - 40 IU/L Final         Passed - ALT in normal range and within 360 days    ALT  Date Value Ref Range Status  01/02/2024 18 0 - 32 IU/L Final         Passed - Patient is not pregnant      Passed - Valid encounter within last 12 months    Recent Outpatient Visits           2 weeks ago Encounter for annual wellness visit (AWV) in Medicare patient   [place] [facility] [place], Marzella Schlein, MD   8 months ago Primary hypertension   [place] Erasmo Downer, MD   11 months ago Pain  in left axilla   [place] [facility] [place], Marzella Schlein, MD   1 year ago Vitamin B12 deficiency   [facility] Caro Laroche, DO   1 year ago Need for COVID-19 vaccine   [facility] Bacigalupo, Marzella Schlein, MD       Future Appointments             In 5 months Bacigalupo, Marzella Schlein, MD [facility]

## 2024-02-07 ENCOUNTER — Ambulatory Visit: Payer: Self-pay

## 2024-02-07 NOTE — Telephone Encounter (Signed)
  Chief Complaint: ear pain Symptoms: L ear pain, cloudy drainage, L neck pain Frequency: 2-3 days now, had infection 01/16/24, was given abx  Pertinent Negatives: Patient denies fever Disposition: [] ED /[x] Urgent Care (no appt availability in office) / [] Appointment(In office/virtual)/ []  [place] [facility]/ [] Home Care/ [] Refused Recommended Disposition /[] [place] [facility]/ []  Follow-up with PCP Additional Notes: pt seen Dr. Roxan Hockey 01/16/24, was given Augmentin. Pt completed abx and states sx have returned. Drainage previously was clear but now looks cloudy. Pt just concerned about recurrent infection with having chest port for infusions. No appts with [facility] or [facility] until 02/10/24, advised UC. Pt agreed to schedule UC appt tomorrow at 1030. Care advice given and pt verbalized understanding.   Reason for Disposition  [1] Taking antibiotic > 72 hours (3 days) and [2] pain persists or recurs  Answer Assessment - Initial Assessment Questions 1. ANTIBIOTIC: "What antibiotic are you taking?" "How many times per day?"     Augmentin  2. ONSET: "When was the antibiotic started?"     01/21/24 3. LOCATION: "Which ear is involved?"     L ear  4. PAIN: "How bad is the pain?"   (Scale 1-10; mild, moderate or severe)   - MILD (1-3): doesn't interfere with normal activities    - MODERATE (4-7): interferes with normal activities or awakens from sleep    - SEVERE (8-10): excruciating pain, unable to do any normal activities      5-6 5. FEVER: "Do you have a fever?" If Yes, ask: "What is your temperature, how was it measured, and when did it start?"     no 6. DISCHARGE: "Is there any discharge from the ear?"     Cloudy drainage  7. OTHER SYMPTOMS: "Do you have any other symptoms?" (e.g., headache, stiff neck, dizziness, vomiting, runny nose)     Neck pain on left side  Protocols used: Ear - Otitis Media Follow-up Call-A-AH

## 2024-02-08 ENCOUNTER — Ambulatory Visit
Admission: RE | Admit: 2024-02-08 | Discharge: 2024-02-08 | Disposition: A | Discharge status: Home / Self Care | Payer: Medicare Other | Source: Ambulatory Visit | Attending: Physician Assistant

## 2024-02-08 VITALS — BP 128/75 | HR 80 | Temp 97.8°F | Resp 15 | Ht 68.0 in | Wt 184.1 lb

## 2024-02-08 DIAGNOSIS — H66002 Acute suppurative otitis media without spontaneous rupture of ear drum, left ear: Secondary | ICD-10-CM

## 2024-02-08 DIAGNOSIS — H60502 Unspecified acute noninfective otitis externa, left ear: Secondary | ICD-10-CM | POA: Diagnosis not present

## 2024-02-08 DIAGNOSIS — H9202 Otalgia, left ear: Secondary | ICD-10-CM | POA: Diagnosis not present

## 2024-02-08 MED ORDER — OFLOXACIN 0.3 % OT SOLN: 10.0000 [drp] | Freq: Every day | OTIC | 0 refills | Status: AC

## 2024-02-08 MED ORDER — CEFDINIR 300 MG PO CAPS: 300.0000 mg | ORAL_CAPSULE | Freq: Two times a day (BID) | ORAL | 0 refills | Status: AC

## 2024-02-08 NOTE — Discharge Instructions (Addendum)
-  You have an infection in the ear canal and also mild infection behind the eardrum.  I sent an oral antibiotic as well as eardrops.  Continue with warm compresses. - Follow-up with your primary doctor in a couple of days to reassess how you are feeling.  If you are not improving then they may consider referral to ENT specialist.

## 2024-02-08 NOTE — ED Triage Notes (Signed)
 Patient reports left ear pain for 3 weeks.  Patient states that she was previously on Augmentin.  Patient reports ongoing swelling below and behind her left ear.

## 2024-02-08 NOTE — ED Provider Notes (Signed)
 [facility]    CSN: [id number] Arrival date & time: 02/08/24  1005      History   Chief Complaint Chief Complaint  Patient presents with   Otalgia    APPOINTMENT left    HPI Shannon Grimes is a 73 y.o. female presents for left ear pain for the past 3 weeks.  She was initially seen a couple weeks ago by PCP and put on Augmentin for a week.  Reports improvement in symptoms but over the past couple days her symptoms worsened.  She reports cloudy drainage from the left ear, tenderness when she touches the ear or lays on that side, pain radiating into her neck and behind her ear.  Denies congestion, sore throat, cough, fever.  No hearing issues, headaches or dizziness.  No history of ear infections.  Has a follow-up appointment with primary care provider in a couple days.  HPI  Past Medical History:  Diagnosis Date   Allergy    Anemia    pernicious   Avascular necrosis (HCC)    right hip   Blood transfusion without reported diagnosis    after hysterectomy   Carpal tunnel syndrome on right    Cubital tunnel syndrome on right    GERD (gastroesophageal reflux disease)    Hyperlipidemia    Hypertension    Migraine headache    3-4x/month   Neuromuscular disorder (HCC)    multiple ortho issues with abnormal EMGs   Osteoarthritis    PONV (postoperative nausea and vomiting)    also triggers migraines    Patient Active Problem List   Diagnosis Date Noted   Non-recurrent acute suppurative otitis media of left ear with spontaneous rupture of tympanic membrane 01/16/2024   Renal cyst 05/07/2023   Senile purpura (HCC) 10/20/2021   Family history of colon cancer 10/20/2021   Psoriatic arthritis (HCC) 02/09/2021   Statin intolerance 11/08/2020   Grief 11/08/2020   Psychophysiological insomnia 11/08/2020   Prediabetes 11/11/2019   Dyspnea on exertion 07/06/2019   Pain and numbness of right upper extremity 10/01/2018   Painful and cold upper extremity 10/01/2018    Constipation due to opioid therapy 09/29/2018   Aortic atherosclerosis (HCC) 08/15/2018   Vitamin B12 deficiency 05/28/2018   Palpitations 11/11/2017   Chronic back pain 11/11/2017   Migraines 10/16/2017   Fibromyalgia 10/16/2017   Osteopenia 10/16/2017   Hyperlipidemia 10/16/2017   Overweight 10/16/2017   Pernicious anemia 10/16/2017   Hypertension 10/16/2017    Past Surgical History:  Procedure Laterality Date   ANKLE FRACTURE SURGERY  1994   screws removed in 2007   ANKLE FUSION Right 2006   APPENDECTOMY  1984   CATARACT EXTRACTION W/PHACO Right 09/15/2018   Procedure: CATARACT EXTRACTION PHACO AND INTRAOCULAR LENS PLACEMENT (IOC) RIGHT;  Surgeon: Nevada Crane, MD;  Location: [facility];  Service: Ophthalmology;  Laterality: Right;   CHOLECYSTECTOMY  1994   CLOSED REDUCTION HIP DISLOCATION     Hip abductor attachment Right 2013   HIP FRACTURE SURGERY  2001   HIP SURGERY Right    Revision   ILIOTIBIAL BAND RELEASE  2016   LUMBAR FUSION  1999   L5-S1 fusion   LUMBAR FUSION  2004   L3-L4 fusion   LUMBAR FUSION  2016   L2-L5, needed revision of previous surgeries   SACROILIAC JOINT FUSION Right 2012   STEROID INJECTION TO SCAR     TOTAL ABDOMINAL HYSTERECTOMY W/ BILATERAL SALPINGOOPHORECTOMY  1984   endometriosis   TOTAL  HIP ARTHROPLASTY Right 2009    OB History     Gravida  3   Para  2   Term      Preterm      AB  1   Living         SAB  1   IAB      Ectopic      Multiple      Live Births               Home Medications    Prior to Admission medications   Medication Sig Start Date End Date Taking? Authorizing Provider  cefdinir (OMNICEF) 300 MG capsule Take 1 capsule (300 mg total) by mouth 2 (two) times daily for 10 days. 02/08/24 02/18/24 Yes Shirlee Latch, PA-C  ofloxacin (FLOXIN) 0.3 % OTIC solution Place 10 drops into the left ear daily for 7 days. 02/08/24 02/15/24 Yes Shirlee Latch, PA-C  Certolizumab Pegol [facility]) Inject into the skin.    [provider]  cyanocobalamin (VITAMIN B12) 1000 MCG/ML injection Inject 1 mL (1,000 mcg total) into the skin every 30 (thirty) days. 10/02/23   Erasmo Downer, MD  estradiol (ESTRACE) 2 MG tablet TAKE 1 TABLET BY MOUTH DAILY 10/14/23   Erasmo Downer, MD  ezetimibe (ZETIA) 10 MG tablet TAKE 1 TABLET BY MOUTH DAILY 01/16/24   Erasmo Downer, MD  gabapentin (NEURONTIN) 400 MG capsule TAKE 1 CAPSULE BY MOUTH 3 TIMES  DAILY 01/08/24   Erasmo Downer, MD  lisinopril-hydrochlorothiazide (ZESTORETIC) 10-12.5 MG tablet TAKE 1 TABLET BY MOUTH DAILY 10/14/23   Erasmo Downer, MD  nortriptyline (PAMELOR) 25 MG capsule TAKE 1 CAPSULE BY MOUTH AT BEDTIME. 10/28/23   Bacigalupo, Marzella Schlein, MD  oxyCODONE-acetaminophen (PERCOCET) 10-325 MG tablet Take 1 tablet by mouth 3 (three) times daily as needed for pain. 05/06/23   Rulon Abide, NP  promethazine (PHENERGAN) 25 MG tablet TAKE 1 TABLET EVERY 4 HOURSAS NEEDED 08/22/23   Bacigalupo, Marzella Schlein, MD  SUMAtriptan (IMITREX) 100 MG tablet TAKE 1 TABLET AT ONSET, MAY REPEAT IN 2    HOURS IF HEADACHE PERSISTS OR RECURS 03/07/23   Erasmo Downer, MD  Syringe/Needle, Disp, (SYRINGE 3CC/25GX5/8") 25G X 5/8" 3 ML MISC 1 each by Does not apply route every 30 (thirty) days. 10/02/23   Bacigalupo, Marzella Schlein, MD  tiZANidine (ZANAFLEX) 4 MG tablet Take 1 tablet (4 mg total) by mouth 2 (two) times daily. 03/07/23   Erasmo Downer, MD    Family History Family History  Problem Relation Age of Onset   Stroke Mother 3   Congestive Heart Failure Mother    Heart disease Father 63   Colon cancer Sister 35   Kidney failure Sister        chronic   Liver disease Sister        end stage   Colon cancer Brother 40   Alcoholism Brother        recovering   Mitral valve prolapse Sister    Breast cancer Neg Hx     Social History Social History   Tobacco Use   Smoking status: Former    Current packs/day:  0.00    Types: Cigarettes    Start date: 01/23/1973    Quit date: 01/23/1983    Years since quitting: 41.0   Smokeless tobacco: Never   Tobacco comments:    was social smoker, not everyday  Vaping Use   Vaping status:  Never Used  Substance Use Topics   Alcohol use: No   Drug use: No     Allergies   Pentazocine, Statins, and Morphine   Review of Systems Review of Systems  Constitutional:  Negative for chills, diaphoresis, fatigue and fever.  HENT:  Positive for ear discharge and ear pain. Negative for congestion, hearing loss, rhinorrhea, sinus pressure, sinus pain and sore throat.   Respiratory:  Negative for cough and shortness of breath.   Gastrointestinal:  Negative for nausea and vomiting.  Musculoskeletal:  Negative for arthralgias and myalgias.  Skin:  Negative for rash.  Neurological:  Negative for weakness and headaches.  Hematological:  Positive for adenopathy.     Physical Exam Triage Vital Signs ED Triage Vitals  Encounter Vitals Group     BP 02/08/24 1047 128/75     Systolic BP Percentile --      Diastolic BP Percentile --      Pulse Rate 02/08/24 1047 80     Resp 02/08/24 1047 15     Temp 02/08/24 1047 97.8 F (36.6 C)     Temp Source 02/08/24 1047 Oral     SpO2 02/08/24 1047 96 %     Weight 02/08/24 1046 184 lb 1.4 oz (83.5 kg)     Height 02/08/24 1046 5\' 8"  (1.727 m)     Head Circumference --      Peak Flow --      Pain Score 02/08/24 1045 5     Pain Loc --      Pain Education --      Exclude from Growth Chart --    No data found.  Updated Vital Signs BP 128/75 (BP Location: Left Arm)   Pulse 80   Temp 97.8 F (36.6 C) (Oral)   Resp 15   Ht 5\' 8"  (1.727 m)   Wt 184 lb 1.4 oz (83.5 kg)   SpO2 96%   BMI 27.99 kg/m    Physical Exam Vitals and nursing note reviewed.  Constitutional:      General: She is not in acute distress.    Appearance: Normal appearance. She is not ill-appearing or toxic-appearing.  HENT:     Head:  Normocephalic and atraumatic.     Right Ear: Tympanic membrane, ear canal and external ear normal.     Left Ear: Drainage (yellowish), swelling (mild, ear canal) and tenderness (tragus) present. Tympanic membrane is erythematous (upper 25% of TM with slight bulging).     Nose: Nose normal.     Mouth/Throat:     Mouth: Mucous membranes are moist.     Pharynx: Oropharynx is clear.  Eyes:     General: No scleral icterus.       Right eye: No discharge.        Left eye: No discharge.     Conjunctiva/sclera: Conjunctivae normal.  Cardiovascular:     Rate and Rhythm: Normal rate and regular rhythm.     Heart sounds: Normal heart sounds.  Pulmonary:     Effort: Pulmonary effort is normal. No respiratory distress.     Breath sounds: Normal breath sounds.  Musculoskeletal:     Cervical back: Neck supple.  Skin:    General: Skin is dry.  Neurological:     General: No focal deficit present.     Mental Status: She is alert. Mental status is at baseline.     Motor: No weakness.     Gait: Gait normal.  Psychiatric:  Mood and Affect: Mood normal.        Behavior: Behavior normal.        Thought Content: Thought content normal.      UC Treatments / Results  Labs (all labs ordered are listed, but only abnormal results are displayed) Labs Reviewed - No data to display  EKG   Radiology No results found.  Procedures Procedures (including critical care time)  Medications Ordered in UC Medications - No data to display  Initial Impression / Assessment and Plan / UC Course  I have reviewed the triage vital signs and the nursing notes.  Pertinent labs & imaging results that were available during my care of the patient were reviewed by me and considered in my medical decision making (see chart for details).   73 year old female presents for left ear pain x 3 weeks.  Initially seen by primary care provider and placed on Augmentin which she completed about a week ago.  Symptoms  worsened after finishing medicine.  Presentation consistent with otitis externa and otitis media.  Treating at this time with cefdinir and ofloxacin eardrops.  Advised warm compresses, Tylenol.  Follow-up with PCP in a couple of days.  May need referral to ENT if not improving.   Final Clinical Impressions(s) / UC Diagnoses   Final diagnoses:  Acute otitis externa of left ear, unspecified type  Acute suppurative otitis media of left ear without spontaneous rupture of tympanic membrane, recurrence not specified  Otalgia of left ear     Discharge Instructions      -You have an infection in the ear canal and also mild infection behind the eardrum.  I sent an oral antibiotic as well as eardrops.  Continue with warm compresses. - Follow-up with your primary doctor in a couple of days to reassess how you are feeling.  If you are not improving then they may consider referral to ENT specialist.     ED Prescriptions     Medication Sig Dispense Auth. Provider   cefdinir (OMNICEF) 300 MG capsule Take 1 capsule (300 mg total) by mouth 2 (two) times daily for 10 days. 20 capsule Eusebio Friendly B, PA-C   ofloxacin (FLOXIN) 0.3 % OTIC solution Place 10 drops into the left ear daily for 7 days. 5 mL Shirlee Latch, PA-C      PDMP not reviewed this encounter.   Shirlee Latch, PA-C 02/08/24 1132

## 2024-02-11 ENCOUNTER — Ambulatory Visit: Payer: Medicare Other | Admitting: Family Medicine

## 2024-02-11 ENCOUNTER — Encounter: Payer: Self-pay | Admitting: Family Medicine

## 2024-02-11 VITALS — BP 131/83 | HR 84 | Wt 183.1 lb

## 2024-02-11 DIAGNOSIS — R5381 Other malaise: Secondary | ICD-10-CM

## 2024-02-11 DIAGNOSIS — H66002 Acute suppurative otitis media without spontaneous rupture of ear drum, left ear: Secondary | ICD-10-CM

## 2024-02-11 DIAGNOSIS — H60502 Unspecified acute noninfective otitis externa, left ear: Secondary | ICD-10-CM | POA: Diagnosis not present

## 2024-02-11 DIAGNOSIS — G8929 Other chronic pain: Secondary | ICD-10-CM

## 2024-02-11 DIAGNOSIS — E538 Deficiency of other specified B group vitamins: Secondary | ICD-10-CM

## 2024-02-11 DIAGNOSIS — M5489 Other dorsalgia: Secondary | ICD-10-CM

## 2024-02-11 NOTE — Progress Notes (Signed)
 Acute visit   Patient: Shannon Grimes   DOB: March 09, 1951   73 y.o. Female  MRN: [id number]  Chief Complaint  Patient presents with   Otitis Media    Pt given two rx Saturday for ear infection by UC. Reports no drainage for the first time in 3-4 weeks as of this morning but reports still having a bump under left ear lobe and is on the same side where neck is tender. Has port on left side so that concerns her   Care Management    Cologuard order 12/31/2023 Zoster Vaccines - Need 2nd injection Tetanus Injection - need to update   Subjective    Discussed the use of AI scribe software for clinical note transcription with the patient, who gave verbal consent to proceed.  History of Present Illness   The patient, with a history of orthopedic surgeries and currently on pain medication, presents with multiple health concerns. The chief complaint is an ear infection, which started with clear fluid drainage and later turned cloudy. The patient sought treatment at an urgent care center where she was diagnosed with both otitis media and otitis externa and prescribed Augmentin and cefdinir. The patient also reports a tender lymph node near the ear, which she was concerned might be related to her port.  The patient also reports a rash, which she suspects might be a side effect of her Cimzia medication. The rash started around the same time she began her loading doses in December. The patient is scheduled to see her rheumatologist in the summer.  The patient also discusses issues with constipation, which she attributes to her pain medication and poor diet. She reports only having bowel movements twice a week despite taking Senna S three times a day. The patient also mentions she has been experiencing sleep issues, which she attributes to discomfort from her various health issues.  The patient also mentions a port that hasn't been flushed since her last Remicade infusion in October. She expresses  frustration with her rheumatologist's office for not being able to flush the port.        Review of Systems  Objective    BP 131/83 (BP Location: Left Arm, Patient Position: Sitting, Cuff Size: Large)   Pulse 84   Wt 183 lb 1.6 oz (83.1 kg)   SpO2 98%   BMI 27.84 kg/m  Physical Exam   Physical Exam   HEENT: Ear canal normal. Mucus behind eardrum. Eardrum not red. NECK: Small, tender, reactive lymph node palpable, not near carotid.       No results found for any visits on 02/11/24.  Assessment & Plan     Problem List Items Addressed This Visit       Other   Chronic back pain   Vitamin B12 deficiency   Other Visit Diagnoses       Non-recurrent acute suppurative otitis media of left ear without spontaneous rupture of tympanic membrane    -  Primary     Acute otitis externa of left ear, unspecified type         Malaise           Assessment and Plan    Otitis Media and Otitis Externa Presents with ear infection initially suspected to be a punctured eardrum, later diagnosed as otitis media and otitis externa. Symptoms included clear fluid drainage turning cloudy and a tender lymph node near the ear. Previously treated with Augmentin for 10 days, currently on cefdinir for  10 days. Examination shows improvement with no drainage and a non-red eardrum, though some mucus is still present behind the eardrum. Lymph node swelling is reactive and will subside. - Continue cefdinir for 7 days - Monitor for recurrence of symptoms - Reassure lymph node swelling is reactive and will subside  Rash (Possible Drug Reaction) Persistent rash since starting Cimzia in December, unchanged despite no changes in laundry or clothing. Suspected side effect of Cimzia. Needs evaluation by Dr. Allena Katz. - Schedule appointment with Dr. Allena Katz to evaluate rash - Consider alternative medications if rash is confirmed to be a drug reaction  Constipation Chronic constipation exacerbated by recent  antibiotic (Augmentin) and pain medications. Currently taking Senna S three times a day with infrequent bowel movements. Symptoms are multifactorial, including infection, new medication, and pain medications. - Continue Senna S - Encourage dietary modifications to include more fiber and fluids - Monitor bowel movements and adjust treatment as necessary  B12 Deficiency Has not received B12 injections since December due to difficulty in drawing up the medication. Reports feeling unwell, possibly related to irregular B12 administration. Demonstrated proper technique for drawing up B12 injections and discussed scheduling regular injections at the clinic if difficulties persist. - Demonstrate proper technique for drawing up B12 injections - Schedule regular B12 injections at the clinic if difficulties persist  General Health Maintenance Due for second Shingrix vaccine and tetanus booster. Discussed importance of completing vaccination schedule. - Administer second Shingrix vaccine at pharmacy - Administer tetanus booster  Follow-up - Follow up with Dr. Allena Katz regarding rash - Schedule follow-up with primary care physician as needed - Monitor for new or worsening symptoms and seek medical attention if necessary.         No orders of the defined types were placed in this encounter.    No follow-ups on file.      Total time spent on today's visit was greater than 30 minutes, including both face-to-face time and nonface-to-face time personally spent on review of chart, answering patient's questions, and coordinating care.   Shirlee Latch, MD  [facility] [phone] (phone) [phone] (fax)  [facility]

## 2024-02-21 ENCOUNTER — Ambulatory Visit
Admission: RE | Admit: 2024-02-21 | Discharge: 2024-02-21 | Disposition: A | Discharge status: Home / Self Care | Payer: Medicare Other | Source: Ambulatory Visit | Attending: Family Medicine | Admitting: Family Medicine

## 2024-02-21 DIAGNOSIS — Z1231 Encounter for screening mammogram for malignant neoplasm of breast: Secondary | ICD-10-CM | POA: Diagnosis present

## 2024-02-25 ENCOUNTER — Encounter: Payer: Self-pay | Admitting: Family Medicine

## 2024-04-24 ENCOUNTER — Other Ambulatory Visit: Payer: Self-pay | Admitting: Family Medicine

## 2024-04-26 ENCOUNTER — Other Ambulatory Visit: Payer: Self-pay | Admitting: Family Medicine

## 2024-04-29 ENCOUNTER — Ambulatory Visit: Payer: Self-pay

## 2024-04-29 NOTE — Telephone Encounter (Signed)
 Chief Complaint: Ear tenderness Symptoms: see notes Frequency: see notes Pertinent Negatives: Patient denies fever Disposition: [] ED /[] Urgent Care (no appt availability in office) / [x] Appointment(In office/virtual)/ []  [place] [facility]/ [] Home Care/ [] Refused Recommended Disposition /[] [place] [facility]/ []  Follow-up with PCP Additional Notes: Patient called to make an appt for evaluation of two separate acute issues. 1st is her ear having cloudy drainage since mid March. Patient states that this has been occurring off and on over the last few months and worsened recently. Patient states she has dry skin around the ear and pain in the ear. Patient also states she had major spinal surgery on the 21st of March and has had increasingly worse pain on the right side of her back, that is worsened with movement and eating. Patient went to 6 week post operative appt on Monday and they ruled out any relation to the spinal surgery and told to follow up in PCP office. Appt for tomorrow.    Copied from CRM [id number]. Topic: Clinical - Red Word Triage >> Apr 29, 2024  3:46 PM Oddis Bench wrote: Red Word that prompted transfer to Nurse Triage: Patient is calling about a ongoing issue with ear she has a cloudy fluid that is coming out her ear and it is making her ear dry.Tender if she lays on her left side. Also had spine surgery at [facility] she had follow up with surgeon and she is stating that she has pain on the right side of her abdomen and she feels nauseated.Says she gets bloated after she eats. Reason for Disposition  Cloudy - white discharge  [1] MODERATE back pain (e.g., interferes with normal activities) AND [2] present > 3 days  Answer Assessment - Initial Assessment Questions 1. LOCATION: "Which ear is involved?"      Left ear 2. COLOR: "What is the color of the discharge?"      Cloudy drainage 3. CONSISTENCY: "How runny is the discharge? Could it be water?"      Thicker, cloudy  consistency 4. ONSET: "When did you first notice the discharge?"     Off and on for months, restarted mid March 5. PAIN: "Is there any earache?" "How bad is it?"  (Scale 1-10; or mild, moderate, severe)     Tender 6. OBJECTS: "Have you put anything in your ear?" (e.g., Q-tip, other object)      No 7. OTHER SYMPTOMS: "Do you have any other symptoms?" (e.g., headache, fever, dizziness, vomiting, runny nose)     Flaky, dry skin outside of ear, ongoing headache,  Answer Assessment - Initial Assessment Questions 1. ONSET: "When did the pain begin?"      Discomfort before surgery on March 21, worsened since surgery 2. LOCATION: "Where does it hurt?" (upper, mid or lower back)     Flank pain - right side 3. SEVERITY: "How bad is the pain?"  (e.g., Scale 1-10; mild, moderate, or severe)   - MILD (1-3): Doesn't interfere with normal activities.    - MODERATE (4-7): Interferes with normal activities or awakens from sleep.    - SEVERE (8-10): Excruciating pain, unable to do any normal activities.      Constant discomfort that ranges and worsens with movement and eating 4. PATTERN: "Is the pain constant?" (e.g., yes, no; constant, intermittent)      Constant 5. RADIATION: "Does the pain shoot into your legs or somewhere else?"     Radiates to front side 6. CAUSE:  "What do you think is causing  the back pain?"      Unsure 7. BACK OVERUSE:  "Any recent lifting of heavy objects, strenuous work or exercise?"     Recent spine surgery on March 21 8. MEDICINES: "What have you taken so far for the pain?" (e.g., nothing, acetaminophen, NSAIDS)     Tylenol 9. NEUROLOGIC SYMPTOMS: "Do you have any weakness, numbness, or problems with bowel/bladder control?"     No 10. OTHER SYMPTOMS: "Do you have any other symptoms?" (e.g., fever, abdomen pain, burning with urination, blood in urine)       No - movement and eating make it worse  Protocols used: Ear - Discharge-A-AH, Back Pain-A-AH

## 2024-04-30 ENCOUNTER — Encounter: Payer: Self-pay | Admitting: Family Medicine

## 2024-04-30 ENCOUNTER — Ambulatory Visit (INDEPENDENT_AMBULATORY_CARE_PROVIDER_SITE_OTHER): Admitting: Family Medicine

## 2024-04-30 VITALS — BP 132/65 | HR 107 | Ht 68.0 in | Wt 177.3 lb

## 2024-04-30 DIAGNOSIS — M546 Pain in thoracic spine: Secondary | ICD-10-CM | POA: Diagnosis not present

## 2024-04-30 DIAGNOSIS — R109 Unspecified abdominal pain: Secondary | ICD-10-CM | POA: Diagnosis not present

## 2024-04-30 DIAGNOSIS — J3489 Other specified disorders of nose and nasal sinuses: Secondary | ICD-10-CM

## 2024-04-30 DIAGNOSIS — H60502 Unspecified acute noninfective otitis externa, left ear: Secondary | ICD-10-CM

## 2024-04-30 MED ORDER — CIPROFLOXACIN HCL 750 MG PO TABS: 750.0000 mg | ORAL_TABLET | Freq: Two times a day (BID) | ORAL | 0 refills | Status: AC

## 2024-04-30 NOTE — Progress Notes (Signed)
 ACUTE PATIENT VISIT    Patient: Shannon Grimes   DOB: August 15, 1951   73 y.o. Female  MRN: [id number] Visit Date: 04/30/2024  Today's healthcare provider: Mimi Alt, MD   PCP: Mazie Speed, MD   Chief Complaint  Patient presents with   Ear Drainage    Tender ear pain that originally had clear drainage but is now cloudy, pt reports in making her ear crusty,  Was seen at urgent care and given antibiotics that she felt helped for a little but then it came back   Back Pain    Right sided back pain that radiates to right upper quadrant, has been present for a while,worse with eating    Subjective     HPI     Ear Drainage    Additional comments: Tender ear pain that originally had clear drainage but is now cloudy, pt reports in making her ear crusty,  Was seen at urgent care and given antibiotics that she felt helped for a little but then it came back        Back Pain    Additional comments: Right sided back pain that radiates to right upper quadrant, has been present for a while,worse with eating      Last edited by Mimi Alt, MD on 05/01/2024  3:12 PM.       Discussed the use of AI scribe software for clinical note transcription with the patient, who gave verbal consent to proceed.  History of Present Illness          Discussed the use of AI scribe software for clinical note transcription with the patient, who gave verbal consent to proceed.  History of Present Illness         Past Medical History:  Diagnosis Date   Allergy    Anemia    pernicious   Avascular necrosis (HCC)    right hip   Blood transfusion without reported diagnosis    after hysterectomy   Carpal tunnel syndrome on right    Cubital tunnel syndrome on right    GERD (gastroesophageal reflux disease)    Hyperlipidemia    Hypertension    Migraine headache    3-4x/month   Neuromuscular disorder (HCC)    multiple ortho issues with abnormal EMGs    Osteoarthritis    PONV (postoperative nausea and vomiting)    also triggers migraines    Medications: Outpatient Medications Prior to Visit  Medication Sig   Certolizumab Pegol (CIMZIA [place]) Inject into the skin.   cyanocobalamin  (VITAMIN B12) 1000 MCG/ML injection Inject 1 mL (1,000 mcg total) into the skin every 30 (thirty) days.   estradiol  (ESTRACE ) 2 MG tablet TAKE 1 TABLET BY MOUTH DAILY   ezetimibe  (ZETIA ) 10 MG tablet TAKE 1 TABLET BY MOUTH DAILY   gabapentin  (NEURONTIN ) 400 MG capsule TAKE 1 CAPSULE BY MOUTH 3 TIMES  DAILY   lisinopril -hydrochlorothiazide  (ZESTORETIC ) 10-12.5 MG tablet TAKE 1 TABLET BY MOUTH DAILY   nortriptyline  (PAMELOR ) 25 MG capsule TAKE 1 CAPSULE BY MOUTH AT BEDTIME.   oxyCODONE-acetaminophen (PERCOCET) 10-325 MG tablet Take 1 tablet by mouth 3 (three) times daily as needed for pain.   promethazine  (PHENERGAN ) 25 MG tablet TAKE 1 TABLET EVERY 4 HOURSAS NEEDED   SUMAtriptan  (IMITREX ) 100 MG tablet TAKE 1 TABLET AT ONSET, MAY REPEAT IN 2    HOURS IF HEADACHE PERSISTS OR RECURS   Syringe/Needle, Disp, (SYRINGE 3CC/25GX5/8") 25G X 5/8" 3 ML MISC 1 each by Does not  apply route every 30 (thirty) days.   tiZANidine  (ZANAFLEX ) 4 MG tablet TAKE 1 TABLET BY MOUTH TWICE  DAILY   No facility-administered medications prior to visit.    Review of Systems      Objective    BP 132/65 (BP Location: Right Arm, Patient Position: Sitting, Cuff Size: Normal)   Pulse (!) 107   Ht 5\' 8"  (1.727 m)   Wt 177 lb 4.8 oz (80.4 kg)   SpO2 99%   BMI 26.96 kg/m  BP Readings from Last 3 Encounters:  04/30/24 132/65  02/11/24 131/83  02/08/24 128/75   Wt Readings from Last 3 Encounters:  04/30/24 177 lb 4.8 oz (80.4 kg)  02/11/24 183 lb 1.6 oz (83.1 kg)  02/08/24 184 lb 1.4 oz (83.5 kg)        Physical Exam  Physical Exam HEENT: Left pinna erythematous and enlarged. Tenderness on specula exam. No perforated tympanic membrane. Tympanic membrane dull, not  erythematous.  Right Flank: no distention, examination of skin of thoracic and lumbar regions is remarkable for well healed surgical incision site, there is no edema noted, no erythema around the surgical site, no fluid drainage nor bleeding, she has tenderness in area of T10 ribs without palpable deformity on exam    Results for orders placed or performed in visit on 04/30/24  CBC  Result Value Ref Range   WBC 9.7 3.4 - 10.8 x10E3/uL   RBC 4.51 3.77 - 5.28 x10E6/uL   Hemoglobin 12.6 11.1 - 15.9 g/dL   Hematocrit 40.9 81.1 - 46.6 %   MCV 86 79 - 97 fL   MCH 27.9 26.6 - 33.0 pg   MCHC 32.6 31.5 - 35.7 g/dL   RDW 91.4 78.2 - 95.6 %   Platelets 227 150 - 450 x10E3/uL  CMP14+EGFR  Result Value Ref Range   Glucose 102 (H) 70 - 99 mg/dL   BUN 19 8 - 27 mg/dL   Creatinine, Ser 2.13 0.57 - 1.00 mg/dL   eGFR 70 >08 MV/HQI/6.96   BUN/Creatinine Ratio 22 12 - 28   Sodium 140 134 - 144 mmol/L   Potassium 4.4 3.5 - 5.2 mmol/L   Chloride 102 96 - 106 mmol/L   CO2 19 (L) 20 - 29 mmol/L   Calcium 9.6 8.7 - 10.3 mg/dL   Total Protein 8.2 6.0 - 8.5 g/dL   Albumin 4.5 3.8 - 4.8 g/dL   Globulin, Total 3.7 1.5 - 4.5 g/dL   Bilirubin Total <2.9 0.0 - 1.2 mg/dL   Alkaline Phosphatase 127 (H) 44 - 121 IU/L   AST 29 0 - 40 IU/L   ALT 13 0 - 32 IU/L  Lipase  Result Value Ref Range   Lipase 26 14 - 85 U/L    Assessment & Plan     Problem List Items Addressed This Visit   None Visit Diagnoses       Acute otitis externa of left ear, unspecified type    -  Primary   Relevant Medications   ciprofloxacin  (CIPRO ) 750 MG tablet     Rhinorrhea       Relevant Orders   Beta-2 Transferrin, BF     Acute right-sided thoracic back pain       Relevant Orders   CBC (Completed)   CMP14+EGFR (Completed)   Lipase (Completed)   US  Abdomen Limited RUQ (LIVER/GB)   DG Ribs Unilateral Right     Right flank pain       Relevant Orders  CBC (Completed)   CMP14+EGFR (Completed)   Lipase (Completed)   US   Abdomen Limited RUQ (LIVER/GB)   DG Ribs Unilateral Right        Assessment & Plan Right thoracic pain Persistent right thoracic pain for several months, exacerbated by movement and eating, radiating to the right flank and right upper quadrant. Recent neurosurgical evaluation ruled out complications from spinal surgery. Differential includes potential complications from pancreatitis. No fevers or chills reported. - Order right upper quadrant ultrasound - Order comprehensive metabolic panel (CMP) - Follow up with PCP for further evaluation of back pain - RUQ US   - Thoracic unilateral rib XR   Otitis externa Left ear with cloudy drainage, pain, and dry skin since mid-March. Left pinna is swollen, erythematous, and tender. Tympanic membrane is dull but not perforated or erythematous. Previous treatments with Augmentin  and ciprofloxacin  drops were ineffective. Concern for potential spinal fluid leak due to recent spinal surgeries and clear rhinorrhea. - Prescribe ciprofloxacin  750 mg twice daily for 7 days - Refer to ENT specialist - Collect rhinorrhea sample for beta-2 transferrin testing at home, pt counseled to obtain 2ml and freeze and return for lab testing ASAP, if unable to collect rhinorrhea sample- recommended discussing further with PCP     Total time spent on today's visit was , including both face-to-face time interviewing and examining the patient, reviewing medical record including labs/imaging/specialist notes, developing and discussing further evaluation,answering patient's questions, counseling on how to collect rhinorrhea samples given concern for spinal fluid leak, coordinating follow up care in addition to documenting in the patient's chart.     Return in about 3 weeks (around 05/21/2024) for ruq pain .         Mimi Alt, MD  [facility] [phone] (phone) [phone] (fax)  [facility]

## 2024-05-01 ENCOUNTER — Encounter: Payer: Self-pay | Admitting: Family Medicine

## 2024-05-01 LAB — CBC
Hematocrit: 38.6 % (ref 34.0–46.6)
Hemoglobin: 12.6 g/dL (ref 11.1–15.9)
MCH: 27.9 pg (ref 26.6–33.0)
MCHC: 32.6 g/dL (ref 31.5–35.7)
MCV: 86 fL (ref 79–97)
Platelets: 227 10*3/uL (ref 150–450)
RBC: 4.51 x10E6/uL (ref 3.77–5.28)
RDW: 14.4 % (ref 11.7–15.4)
WBC: 9.7 10*3/uL (ref 3.4–10.8)

## 2024-05-01 LAB — LIPASE: Lipase: 26 U/L (ref 14–85)

## 2024-05-01 LAB — CMP14+EGFR
ALT: 13 IU/L (ref 0–32)
AST: 29 IU/L (ref 0–40)
Albumin: 4.5 g/dL (ref 3.8–4.8)
Alkaline Phosphatase: 127 IU/L — ABNORMAL HIGH (ref 44–121)
BUN/Creatinine Ratio: 22 (ref 12–28)
BUN: 19 mg/dL (ref 8–27)
Bilirubin Total: <0.2 mg/dL (ref 0.0–1.2)
CO2: 19 mmol/L — ABNORMAL LOW (ref 20–29)
Calcium: 9.6 mg/dL (ref 8.7–10.3)
Chloride: 102 mmol/L (ref 96–106)
Creatinine, Ser: 0.87 mg/dL (ref 0.57–1.00)
Globulin, Total: 3.7 g/dL (ref 1.5–4.5)
Glucose: 102 mg/dL — ABNORMAL HIGH (ref 70–99)
Potassium: 4.4 mmol/L (ref 3.5–5.2)
Sodium: 140 mmol/L (ref 134–144)
Total Protein: 8.2 g/dL (ref 6.0–8.5)
eGFR: 70 mL/min/{1.73_m2} (ref >59)

## 2024-05-07 ENCOUNTER — Encounter: Payer: Self-pay | Admitting: Family Medicine

## 2024-05-07 ENCOUNTER — Ambulatory Visit
Admission: RE | Admit: 2024-05-07 | Discharge: 2024-05-07 | Disposition: A | Discharge status: Home / Self Care | Source: Ambulatory Visit | Attending: Family Medicine | Admitting: Family Medicine

## 2024-05-07 ENCOUNTER — Ambulatory Visit: Payer: Self-pay | Admitting: Family Medicine

## 2024-05-07 DIAGNOSIS — R109 Unspecified abdominal pain: Secondary | ICD-10-CM | POA: Diagnosis present

## 2024-05-07 DIAGNOSIS — M546 Pain in thoracic spine: Secondary | ICD-10-CM | POA: Insufficient documentation

## 2024-06-22 ENCOUNTER — Telehealth: Payer: Self-pay | Admitting: Family Medicine

## 2024-06-22 NOTE — Telephone Encounter (Signed)
 Called and left patient a voicemail that 7/10 appt is cancelled due to provider not available. If patient calls back ok to reschedule

## 2024-07-02 ENCOUNTER — Ambulatory Visit: Payer: Self-pay | Admitting: Family Medicine

## 2024-07-21 ENCOUNTER — Encounter: Payer: Self-pay | Admitting: Family Medicine

## 2024-07-21 ENCOUNTER — Ambulatory Visit (INDEPENDENT_AMBULATORY_CARE_PROVIDER_SITE_OTHER): Admitting: Family Medicine

## 2024-07-21 VITALS — BP 134/80 | HR 100 | Temp 97.7°F | Ht 68.0 in | Wt 170.6 lb

## 2024-07-21 DIAGNOSIS — L4050 Arthropathic psoriasis, unspecified: Secondary | ICD-10-CM

## 2024-07-21 DIAGNOSIS — H9202 Otalgia, left ear: Secondary | ICD-10-CM | POA: Diagnosis not present

## 2024-07-21 DIAGNOSIS — R0609 Other forms of dyspnea: Secondary | ICD-10-CM | POA: Diagnosis not present

## 2024-07-21 DIAGNOSIS — R053 Chronic cough: Secondary | ICD-10-CM | POA: Diagnosis not present

## 2024-07-21 NOTE — Assessment & Plan Note (Addendum)
 Currently not managed. Has been experiencing recent exacerbation of dyspnea after back surgery. Took lovenox for clot prevention after surgery. Feels like her lungs do not expand as well and has shortness of breath walking short distances. History of heart disease in the immediate family. Also has productive sputum. Concerns for possible underlying infection, medication toxicity, or cardiac etiology. - Order CBC w/Diff - Order TB Gold test - Order Chest Xray, moving to CT if needed - Ambulatory referral to Cardiology and ENT

## 2024-07-21 NOTE — Progress Notes (Unsigned)
 Acute Office Visit  Subjective:     Patient ID: Shannon Grimes, female    DOB: 10-22-1951, 73 y.o.   MRN: [id number]  Chief Complaint  Patient presents with   Cough    Productive cough onse 6 weeks, some sob upon exertion. States she had back surgery in march and has some pain in her back when taking a deep breath.    Ear Pain    Ear pain and tenderness, has been off and on for the past few months. Started again a few weeks after surgery.     Psoriasis    Patient would like a few places examined in base of neck and sides of head behind ears. Believes they may be psoriasis plaques, patient has psoriatic arthritis.     Shannon Grimes is a 73 year old female with a history of multiple back surgeries, otitis media of the left ear, and psoriatic arthritis who presents to the clinic acutely for evaluation of shortness of breath, left ear pain, and psoriatic plaques.  On interview, she reports that since her back surgery in March her energy levels have been low. She reports getting winded even just walking around a little. She describes the feeling as an inability to expand her lungs. She endorses feeling faint when she has this difficulty breathing as well as experiencing pain in the muscles around her incision site. She denies any chest tightness, chest pain, or increased swelling. She also denies any fever, chills, or sweats, but endorses a feeling in the back of her throat that has accompanied her shortness of breath. She reports coughing up mucus throughout the day with the color being green-brown in the morning and a cloudy white by the afternoon. She denies any blood in the mucus.  She also endorses left ear pain. She reports a history of having recurrent infection in the ear that resolve with antibiotics for periods of 3 to 4 weeks before returning. She reports discharge often that is cloudy at times, but also clear. She states she has an enlarged lymph node in the area that has been  persistent.  Additionally, she reports having psoriatic plaques. She was diagnosed with psoriatic arthritis as a teenager and has never had a plaque breakout since then. She endorses switching medications recently. She started medication in 2018 with Remicaid, adding methotrexate (which was intolerable for more than 1.5 years because of the side effects). She recently switched to Cimzia and Arava for management before her current plaque outbreak.   ROS      Objective:    BP 134/80 (BP Location: Left Arm, Patient Position: Sitting, Cuff Size: Normal)   Pulse 100   Temp 97.7 F (36.5 C) (Oral)   Ht 5' 8 (1.727 m)   Wt 170 lb 9.6 oz (77.4 kg)   SpO2 98%   BMI 25.94 kg/m    Physical Exam Vitals reviewed.  Constitutional:      General: She is not in acute distress.    Appearance: Normal appearance. She is not ill-appearing or diaphoretic.  HENT:     Head: Normocephalic.     Comments: Psoriatic plaque in the occipital/cervical region.    Right Ear: Tympanic membrane, ear canal and external ear normal.     Left Ear: Tympanic membrane, ear canal and external ear normal.     Ears:     Comments: Swollen lymph node near left auricle. Psoriatic plaques bilaterally behind each ear.    Nose: Nose normal.  Mouth/Throat:     Mouth: Mucous membranes are moist.     Pharynx: Oropharynx is clear. No oropharyngeal exudate or posterior oropharyngeal erythema.  Eyes:     General: No scleral icterus.    Conjunctiva/sclera: Conjunctivae normal.     Pupils: Pupils are equal, round, and reactive to light.  Cardiovascular:     Rate and Rhythm: Normal rate and regular rhythm.     Pulses: Normal pulses.     Heart sounds: Normal heart sounds. No murmur heard.    No friction rub. No gallop.  Pulmonary:     Effort: Pulmonary effort is normal. No respiratory distress.     Breath sounds: Normal breath sounds. No wheezing.  Abdominal:     General: There is no distension.     Palpations: Abdomen  is soft.     Tenderness: There is no abdominal tenderness. There is no guarding.  Musculoskeletal:     Cervical back: Normal range of motion.     Right lower leg: No edema.     Left lower leg: No edema.  Lymphadenopathy:     Cervical: No cervical adenopathy.  Skin:    General: Skin is warm and dry.     Capillary Refill: Capillary refill takes less than 2 seconds.  Neurological:     Mental Status: She is alert and oriented to person, place, and time.  Psychiatric:        Mood and Affect: Mood normal.     No results found for any visits on 07/21/24.      Assessment & Plan:   Problem List Items Addressed This Visit       Musculoskeletal and Integument   Psoriatic arthritis (HCC)   Currently managed by Rheumatology. Diagnosed as a teenager with medication therapy starting in 2018. Has been on Remicaid and Methotrexate before transitioning to Cimzia and Arava recently. Currently experiencing first plaques since early 20s on her head in three locations. - Follow up with Rheum for medication management of psoriatic arthritis      Relevant Medications   leflunomide (ARAVA) 10 MG tablet     Other   Dyspnea on exertion   Currently not managed. Has been experiencing recent exacerbation of dyspnea after back surgery. Took lovenox for clot prevention after surgery. Feels like her lungs do not expand as well and has shortness of breath walking short distances. History of heart disease in the immediate family. Also has productive sputum. Concerns for possible underlying infection, medication toxicity, or cardiac etiology. - Order CBC w/Diff - Order TB Gold test - Order Chest Xray, moving to CT if needed - Ambulatory referral to Cardiology and ENT      Relevant Orders   QuantiFERON-TB Gold Plus   DG Chest 2 View   Ambulatory referral to Cardiology   Other Visit Diagnoses       Left ear pain    -  Primary   Relevant Orders   Ambulatory referral to ENT     Chronic cough        Relevant Orders   CBC w/Diff/Platelet   QuantiFERON-TB Gold Plus   DG Chest 2 View      Left Ear Pain Currently not managed. History of otitis media in the left ear treated successfully with antibiotics for 3-4 weeks before recurrence. Describes clear to cloudy discharge often without underlying inflammation. Accompanied by swollen left lymph node that has remained unchanged throughout. Has not seen a specialist before for further evaluation - Ambulatory referral to  ENT  No orders of the defined types were placed in this encounter.   Return in about 4 weeks (around 08/18/2024) for breathing.  Elia LULLA Blanch, Medical Student  I personally spent a total of 48 minutes in the care of the patient today including preparing to see the patient, getting/reviewing separately obtained history, performing a medically appropriate exam/evaluation, counseling and educating, placing orders, referring and communicating with other health care professionals, documenting clinical information in the EHR, and coordinating care.    Patient seen along with MS3 student, Elia Blanch. I personally evaluated this patient along with the student, and verified all aspects of the history, physical exam, and medical decision making as documented by the student. I agree with the student's documentation and have made all necessary edits.  Bacigalupo, Jon HERO, MD, MPH [facility]

## 2024-07-21 NOTE — Assessment & Plan Note (Signed)
 Currently managed by Rheumatology. Diagnosed as a teenager with medication therapy starting in 2018. Has been on Remicaid and Methotrexate before transitioning to Cimzia and Arava recently. Currently experiencing first plaques since early 20s on her head in three locations. - Follow up with Rheum for medication management of psoriatic arthritis

## 2024-07-22 ENCOUNTER — Encounter: Payer: Self-pay | Admitting: Family Medicine

## 2024-07-22 ENCOUNTER — Ambulatory Visit
Admission: RE | Admit: 2024-07-22 | Discharge: 2024-07-22 | Disposition: A | Discharge status: Home / Self Care | Attending: Family Medicine | Admitting: Family Medicine

## 2024-07-22 ENCOUNTER — Ambulatory Visit
Admission: RE | Admit: 2024-07-22 | Discharge: 2024-07-22 | Disposition: A | Discharge status: Home / Self Care | Source: Ambulatory Visit | Attending: Family Medicine

## 2024-07-22 DIAGNOSIS — R053 Chronic cough: Secondary | ICD-10-CM

## 2024-07-22 DIAGNOSIS — R0609 Other forms of dyspnea: Secondary | ICD-10-CM | POA: Diagnosis present

## 2024-07-24 ENCOUNTER — Ambulatory Visit: Payer: Self-pay | Admitting: Family Medicine

## 2024-07-24 LAB — CBC WITH DIFFERENTIAL/PLATELET
Basophils Absolute: 0.1 x10E3/uL (ref 0.0–0.2)
Basos: 1 %
EOS (ABSOLUTE): 0.3 x10E3/uL (ref 0.0–0.4)
Eos: 4 %
Hematocrit: 41.2 % (ref 34.0–46.6)
Hemoglobin: 13.3 g/dL (ref 11.1–15.9)
Immature Grans (Abs): 0 x10E3/uL (ref 0.0–0.1)
Immature Granulocytes: 0 %
Lymphocytes Absolute: 3.1 x10E3/uL (ref 0.7–3.1)
Lymphs: 42 %
MCH: 27.4 pg (ref 26.6–33.0)
MCHC: 32.3 g/dL (ref 31.5–35.7)
MCV: 85 fL (ref 79–97)
Monocytes Absolute: 0.7 x10E3/uL (ref 0.1–0.9)
Monocytes: 9 %
Neutrophils Absolute: 3.3 x10E3/uL (ref 1.4–7.0)
Neutrophils: 44 %
Platelets: 187 x10E3/uL (ref 150–450)
RBC: 4.86 x10E6/uL (ref 3.77–5.28)
RDW: 16.9 % — ABNORMAL HIGH (ref 11.7–15.4)
WBC: 7.4 x10E3/uL (ref 3.4–10.8)

## 2024-07-24 LAB — QUANTIFERON-TB GOLD PLUS
QuantiFERON Mitogen Value: >10 [IU]/mL
QuantiFERON Nil Value: 0.03 [IU]/mL
QuantiFERON TB1 Ag Value: 0.03 [IU]/mL
QuantiFERON TB2 Ag Value: 0.03 [IU]/mL

## 2024-07-27 ENCOUNTER — Ambulatory Visit: Admitting: Family Medicine

## 2024-07-28 NOTE — Telephone Encounter (Signed)
Please see the message below and advise.

## 2024-08-24 ENCOUNTER — Other Ambulatory Visit: Payer: Self-pay | Admitting: Family Medicine

## 2024-08-25 NOTE — Telephone Encounter (Signed)
 LOV 07/21/24(acute visit) NOV none scheduled LRF 10/14/23 qty:90 r:3

## 2024-09-04 NOTE — Progress Notes (Signed)
 Cardiology Office Note  Date:  09/07/2024   ID:  Margerie Fraiser, DOB 30-Apr-1951, MRN [id number]  PCP:  Myrla Jon HERO, MD   Chief Complaint  Patient presents with   New Patient (Initial Visit)    Ref by DR. Bacigalupo for shortness of breath. Patient c/o shortness of breath with little to no activity.    HPI:  Bentlee Drier a 73 y.o. femalewith past medical history of: Pancreatitis from statins Smoker in her 14s Hyperlipidemia, statin intolerance Palpitations, Migraines Chronic orthopedic issues, chronic leg and back pain Coronary calcium score of 0 in 10/2017 Aortic Atherosclerosis Who presents by referral from Dr. Myrla for consultation of her shortness of breath on exertion  Previously seen by myself August 2019 Had symptoms of shortness of breath at that time  Prior cardiac studies reviewed  echo 2019:  Essentially a normal study  No significant valve disease  Normal pressures in the heart   To catch up on recent events, has had several surgeries After fall, hip revisions x 3  Fall in driveway, C-spine fracture Surgery in back Reports having chronic pain in thoracic area,  Wears back brace, does better with it Some days better than others Some days pain is severe, stops her in her tracks, develops shortness of breath  Completed PT since 5/25 Does water therapy  EKG personally reviewed by myself on todays visit EKG Interpretation Date/Time:  Monday September 07 2024 10:13:04 EDT Ventricular Rate:  82 PR Interval:  164 QRS Duration:  98 QT Interval:  402 QTC Calculation: 469 R Axis:   -11  Text Interpretation: Normal sinus rhythm Possible Left atrial enlargement When compared with ECG of 07-Mar-2020 09:53, PR interval has decreased Confirmed by Perla Lye [phone]) on 09/07/2024 10:35:17 AM    PMH:   has a past medical history of Allergy, Anemia, Avascular necrosis (HCC), Blood transfusion without reported diagnosis, Carpal tunnel syndrome  on right, Cubital tunnel syndrome on right, GERD (gastroesophageal reflux disease), Hyperlipidemia, Hypertension, Migraine headache, Neuromuscular disorder (HCC), Osteoarthritis, and PONV (postoperative nausea and vomiting).  PSH:    Past Surgical History:  Procedure Laterality Date   ANKLE FRACTURE SURGERY  1994   screws removed in 2007   ANKLE FUSION Right 2006   APPENDECTOMY  1984   CATARACT EXTRACTION W/PHACO Right 09/15/2018   Procedure: CATARACT EXTRACTION PHACO AND INTRAOCULAR LENS PLACEMENT (IOC) RIGHT;  Surgeon: Myrna Adine Anes, MD;  Location: [facility];  Service: Ophthalmology;  Laterality: Right;   CHOLECYSTECTOMY  1994   CLOSED REDUCTION HIP DISLOCATION     Hip abductor attachment Right 2013   HIP FRACTURE SURGERY  2001   HIP SURGERY Right    Revision   ILIOTIBIAL BAND RELEASE  2016   LAMINECTOMY     Ma[place] 21, 2025   LUMBAR FUSION  1999   L5-S1 fusion   LUMBAR FUSION  2004   L3-L4 fusion   LUMBAR FUSION  2016   L2-L5, needed revision of previous surgeries   SACROILIAC JOINT FUSION Right 2012   STEROID INJECTION TO SCAR     TOTAL ABDOMINAL HYSTERECTOMY W/ BILATERAL SALPINGOOPHORECTOMY  1984   endometriosis   TOTAL HIP ARTHROPLASTY Right 2009    Current Outpatient Medications  Medication Sig Dispense Refill   cyanocobalamin  (VITAMIN B12) 1000 MCG/ML injection Inject 1 mL (1,000 mcg total) into the skin every 30 (thirty) days. 10 mL 1   estradiol  (ESTRACE ) 2 MG tablet TAKE 1 TABLET BY MOUTH DAILY 90 tablet 3   ezetimibe  (  ZETIA ) 10 MG tablet TAKE 1 TABLET BY MOUTH DAILY 90 tablet 3   gabapentin  (NEURONTIN ) 400 MG capsule TAKE 1 CAPSULE BY MOUTH 3 TIMES  DAILY 270 capsule 3   leflunomide (ARAVA) 10 MG tablet Take 10 mg by mouth daily.     lisinopril -hydrochlorothiazide  (ZESTORETIC ) 10-12.5 MG tablet TAKE 1 TABLET BY MOUTH DAILY 90 tablet 1   nortriptyline  (PAMELOR ) 25 MG capsule TAKE 1 CAPSULE BY MOUTH AT BEDTIME. 90 capsule 1   oxyCODONE-acetaminophen  (PERCOCET) 10-325 MG tablet Take 1 tablet by mouth 3 (three) times daily as needed for pain.     promethazine  (PHENERGAN ) 25 MG tablet TAKE 1 TABLET EVERY 4 HOURSAS NEEDED 30 tablet 1   Secukinumab (COSENTYX IV) Inject into the vein as directed.     SUMAtriptan  (IMITREX ) 100 MG tablet TAKE 1 TABLET AT ONSET, MAY REPEAT IN 2    HOURS IF HEADACHE PERSISTS OR RECURS 18 tablet 5   Syringe/Needle, Disp, (SYRINGE 3CC/25GX5/8) 25G X 5/8 3 ML MISC 1 each by Does not apply route every 30 (thirty) days. 10 each 1   tiZANidine  (ZANAFLEX ) 4 MG tablet TAKE 1 TABLET BY MOUTH TWICE  DAILY 180 tablet 2   No current facility-administered medications for this visit.    Allergies:   Pentazocine, Statins, and Morphine   Social History:  The patient  reports that she quit smoking about 41 years ago. Her smoking use included cigarettes. She started smoking about 51 years ago. She has never used smokeless tobacco. She reports that she does not drink alcohol and does not use drugs.   Family History:   family history includes Alcoholism in her brother; Colon cancer (age of onset: 28) in her sister; Colon cancer (age of onset: 79) in her brother; Congestive Heart Failure in her mother; Heart disease (age of onset: 40) in her father; Kidney failure in her sister; Liver disease in her sister; Mitral valve prolapse in her sister; Stroke (age of onset: 38) in her mother.    Review of Systems: Review of Systems  Constitutional: Negative.   HENT: Negative.    Respiratory: Negative.    Cardiovascular: Negative.   Gastrointestinal: Negative.   Musculoskeletal: Negative.   Neurological: Negative.   Psychiatric/Behavioral: Negative.    All other systems reviewed and are negative.  PHYSICAL EXAM: VS:  BP 130/60 (BP Location: Left Arm, Patient Position: Sitting, Cuff Size: Normal)   Pulse 82   Ht 5' 8 (1.727 m)   Wt 168 lb (76.2 kg)   SpO2 96%   BMI 25.54 kg/m  , BMI Body mass index is 25.54 kg/m. GEN: Well  nourished, well developed, in no acute distress HEENT: normal Neck: no JVD, carotid bruits, or masses Cardiac: RRR; no murmurs, rubs, or gallops,no edema  Respiratory:  clear to auscultation bilaterally, normal work of breathing GI: soft, nontender, nondistended, + BS MS: no deformity or atrophy Skin: warm and dry, no rash Neuro:  Strength and sensation are intact Psych: euthymic mood, full affect  Recent Labs: 04/30/2024: ALT 13; BUN 19; Creatinine, Ser 0.87; Potassium 4.4; Sodium 140 07/21/2024: Hemoglobin 13.3; Platelets 187    Lipid Panel Lab Results  Component Value Date   CHOL 211 (H) 01/02/2024   HDL 43 01/02/2024   LDLCALC 118 (H) 01/02/2024   TRIG 286 (H) 01/02/2024      Wt Readings from Last 3 Encounters:  09/07/24 168 lb (76.2 kg)  07/21/24 170 lb 9.6 oz (77.4 kg)  04/30/24 177 lb 4.8 oz (80.4  kg)     ASSESSMENT AND PLAN:  Problem List Items Addressed This Visit       Cardiology Problems   Hyperlipidemia   Hypertension   Relevant Orders   EKG 12-Lead (Completed)   Aortic atherosclerosis (HCC) - Primary   Relevant Orders   EKG 12-Lead (Completed)     Other   Dyspnea on exertion   Relevant Orders   EKG 12-Lead (Completed)   Prediabetes   SOB Seems to correlate with her back pain, rib pain following extensive back surgery Appears grossly euvolemic, less likely anginal  Prior calcium score 2018 score of 0 symptoms given some atypical features - Recommend echocardiogram for further evaluation - Able to participate in water therapy, low impact exercises Wears back brace for support  Hyperlipidemia On Zetia   aortic atherosclerosis, minimal Noted on CT scan 2019 Lifestyle modification recommended, currently exercising Well-controlled A1c 6.1  Prediabetes Calorie restriction recommended, A1c 6.1 relatively well-controlled Active, exercises   Signed, Velinda Lunger, M.D., Ph.D. [facility] [place], [place] [phone]

## 2024-09-07 ENCOUNTER — Encounter: Payer: Self-pay | Admitting: Cardiovascular Disease

## 2024-09-07 ENCOUNTER — Ambulatory Visit: Attending: Cardiovascular Disease | Admitting: Cardiovascular Disease

## 2024-09-07 VITALS — BP 130/60 | HR 82 | Ht 68.0 in | Wt 168.0 lb

## 2024-09-07 DIAGNOSIS — E782 Mixed hyperlipidemia: Secondary | ICD-10-CM | POA: Diagnosis not present

## 2024-09-07 DIAGNOSIS — I700 Atherosclerosis of aorta: Secondary | ICD-10-CM | POA: Diagnosis not present

## 2024-09-07 DIAGNOSIS — R0609 Other forms of dyspnea: Secondary | ICD-10-CM | POA: Diagnosis not present

## 2024-09-07 DIAGNOSIS — R7303 Prediabetes: Secondary | ICD-10-CM

## 2024-09-07 DIAGNOSIS — I10 Essential (primary) hypertension: Secondary | ICD-10-CM

## 2024-09-07 NOTE — Patient Instructions (Addendum)

## 2024-09-10 ENCOUNTER — Ambulatory Visit: Admitting: Family Medicine

## 2024-09-15 ENCOUNTER — Encounter: Payer: Self-pay | Admitting: Family Medicine

## 2024-09-15 ENCOUNTER — Ambulatory Visit: Admitting: Family Medicine

## 2024-09-15 VITALS — BP 130/64 | HR 83 | Resp 14 | Ht 68.0 in | Wt 168.4 lb

## 2024-09-15 DIAGNOSIS — I700 Atherosclerosis of aorta: Secondary | ICD-10-CM | POA: Diagnosis not present

## 2024-09-15 DIAGNOSIS — L400 Psoriasis vulgaris: Secondary | ICD-10-CM | POA: Diagnosis not present

## 2024-09-15 DIAGNOSIS — L4050 Arthropathic psoriasis, unspecified: Secondary | ICD-10-CM | POA: Diagnosis not present

## 2024-09-15 DIAGNOSIS — H60391 Other infective otitis externa, right ear: Secondary | ICD-10-CM

## 2024-09-15 DIAGNOSIS — R0609 Other forms of dyspnea: Secondary | ICD-10-CM

## 2024-09-15 MED ORDER — BETAMETHASONE DIPROPIONATE 0.05 % EX CREA: TOPICAL_CREAM | Freq: Two times a day (BID) | CUTANEOUS | 2 refills | Status: AC

## 2024-09-15 NOTE — Progress Notes (Signed)
 Established patient visit   Patient: Shannon Grimes   DOB: January 07, 1951   73 y.o. Female  MRN: [id number] Visit Date: 09/15/2024  Today's healthcare provider: Jon Eva, MD   Chief Complaint  Patient presents with   Follow-up    L ear problem that has resolved when seeing ENT ongoing for years.  C-spine, t-spine has been having trouble with pain and having some SOB, saw cardiologist EKG- was good. Follow up since being referred.   Discuss rheumatology medication changed.    Medication Refill    Betamethasone  dipropionate 0.05 uses when gets a rash around facial and helps it and would like it to be refilled   Subjective    HPI HPI     Follow-up    Additional comments: L ear problem that has resolved when seeing ENT ongoing for years.  C-spine, t-spine has been having trouble with pain and having some SOB, saw cardiologist EKG- was good. Follow up since being referred.   Discuss rheumatology medication changed.         Medication Refill    Additional comments: Betamethasone  dipropionate 0.05 uses when gets a rash around facial and helps it and would like it to be refilled      Last edited by Shannon Grimes, CMA on 09/15/2024  1:16 PM.       Discussed the use of AI scribe software for clinical note transcription with the patient, who gave verbal consent to proceed.  History of Present Illness   Shannon Grimes is a 73 year old female with recurrent ear infections and psoriasis who presents for follow-up on her ear infection and psoriasis management.  She experiences recurrent staphylococcal ear infections, confirmed by culture, which recur after stopping antibiotic drops. She uses an ointment with a Q-tip if symptoms reappear.  Her scalp psoriasis has worsened, possibly due to stress from recent surgeries. She previously used Cimzia without success and has started Cosentyx, with one dose administered. There is no improvement in psoriasis, but joint symptoms  have slightly improved. The severity of scalp psoriasis causes self-consciousness about haircuts.  She is on Cosentyx for psoriasis and psoriatic arthritis and uses betamethasone  cream for facial plaques. She recently received her second Shingrix vaccine and plans to get a COVID booster soon.       Medications: Outpatient Medications Prior to Visit  Medication Sig   cyanocobalamin  (VITAMIN B12) 1000 MCG/ML injection Inject 1 mL (1,000 mcg total) into the skin every 30 (thirty) days.   estradiol  (ESTRACE ) 2 MG tablet TAKE 1 TABLET BY MOUTH DAILY   ezetimibe  (ZETIA ) 10 MG tablet TAKE 1 TABLET BY MOUTH DAILY   gabapentin  (NEURONTIN ) 400 MG capsule TAKE 1 CAPSULE BY MOUTH 3 TIMES  DAILY   leflunomide (ARAVA) 10 MG tablet Take 10 mg by mouth daily.   lisinopril -hydrochlorothiazide  (ZESTORETIC ) 10-12.5 MG tablet TAKE 1 TABLET BY MOUTH DAILY   nortriptyline  (PAMELOR ) 25 MG capsule TAKE 1 CAPSULE BY MOUTH AT BEDTIME.   oxyCODONE-acetaminophen (PERCOCET) 10-325 MG tablet Take 1 tablet by mouth 3 (three) times daily as needed for pain.   promethazine  (PHENERGAN ) 25 MG tablet TAKE 1 TABLET EVERY 4 HOURSAS NEEDED   Secukinumab (COSENTYX IV) Inject into the vein as directed.   SUMAtriptan  (IMITREX ) 100 MG tablet TAKE 1 TABLET AT ONSET, MAY REPEAT IN 2    HOURS IF HEADACHE PERSISTS OR RECURS   Syringe/Needle, Disp, (SYRINGE 3CC/25GX5/8) 25G X 5/8 3 ML MISC 1 each by Does not apply route  every 30 (thirty) days.   tiZANidine  (ZANAFLEX ) 4 MG tablet TAKE 1 TABLET BY MOUTH TWICE  DAILY   No facility-administered medications prior to visit.    Review of Systems     Objective    BP 130/64   Pulse 83   Resp 14   Ht 5' 8 (1.727 m)   Wt 168 lb 6.4 oz (76.4 kg)   SpO2 99%   BMI 25.61 kg/m    Physical Exam Vitals reviewed.  Constitutional:      General: She is not in acute distress.    Appearance: Normal appearance. She is well-developed. She is not diaphoretic.  HENT:     Head: Normocephalic  and atraumatic.  Eyes:     General: No scleral icterus.    Conjunctiva/sclera: Conjunctivae normal.  Neck:     Thyroid : No thyromegaly.  Cardiovascular:     Rate and Rhythm: Normal rate and regular rhythm.     Heart sounds: Normal heart sounds. No murmur heard. Pulmonary:     Effort: Pulmonary effort is normal. No respiratory distress.     Breath sounds: Normal breath sounds. No wheezing, rhonchi or rales.  Musculoskeletal:     Right lower leg: No edema.     Left lower leg: No edema.  Skin:    General: Skin is warm and dry.     Findings: Rash (plaque psoriasis) present.  Neurological:     Mental Status: She is alert and oriented to person, place, and time. Mental status is at baseline.  Psychiatric:        Mood and Affect: Mood normal.        Behavior: Behavior normal.      No results found for any visits on 09/15/24.  Assessment & Plan     Problem List Items Addressed This Visit       Cardiovascular and Mediastinum   Aortic atherosclerosis     Musculoskeletal and Integument   Psoriatic arthritis (HCC) - Primary   Other Visit Diagnoses       Plaque psoriasis         Other infective chronic otitis externa of right ear         DOE (dyspnea on exertion)               Plaque psoriasis with scalp involvement and psoriatic arthritis Chronic plaque psoriasis with significant scalp involvement, exacerbated by recent surgeries and stress. Psoriatic arthritis with joint involvement. Currently on Cosentyx, a biologic targeting interleukin-17, after failing TNF inhibitors. One dose of Cosentyx received with some improvement in joint symptoms, but no significant change in skin plaques yet. - Continue Cosentyx infusions - Discuss potential dose adjustment of Cosentyx if no skin clearance - Refill betamethasone  cream for facial plaques  Chronic/recurrent right otitis externa due to Staphylococcus infection Chronic right otitis externa with recurrent Staphylococcus infection.  Recent ENT evaluation confirmed staph infection. Previous antibiotic drops provided temporary relief, but infection recurred upon cessation. - Use ENT-provided ointment with Q-tip at the onset of symptoms  Cervical spondylosis status post cervical spine surgery Cervical spine surgery with ongoing neck pain. Neurosurgeon ordered a bone scan to evaluate persistent pain preventing proper posture. - Undergo bone scan as ordered by neurosurgeon - Continue aquatic therapy for two months  Chronic pain syndrome with deconditioning Chronic pain syndrome with deconditioning, likely exacerbated by recent surgeries and inadequate pain management. Cardiologist and neurosurgeon evaluations suggest pain is more orthopedic in nature rather than cardiac. - Continue aquatic therapy to  improve conditioning  Dyspnea on exertion Dyspnea on exertion with recent cardiology evaluation. EKG normal, and cardiologist suspects symptoms are related to pain and deconditioning rather than cardiac issues. - Undergo echocardiogram in early October       Return in about 4 months (around 01/15/2025) for AWV, CPE.      Jon Eva, MD  [facility] [phone] (phone) [phone] (fax)  [facility]

## 2024-10-02 NOTE — Progress Notes (Signed)
 Pharmacy Quality Measure Review  This patient is appearing on a report for being at risk of failing the adherence measure for hypertension (ACEi/ARB) medications this calendar year.   Medication: lisinopril /hydrochlorothiazide   Last fill date: 09/23/24 for 90 day supply  Insurance report was not up to date. No action needed at this time.   Annalaura Sauseda E. Marsh, PharmD Clinical Pharmacist [facility] [phone]

## 2024-10-14 ENCOUNTER — Other Ambulatory Visit: Payer: Self-pay | Admitting: Family Medicine

## 2024-10-14 DIAGNOSIS — E538 Deficiency of other specified B group vitamins: Secondary | ICD-10-CM

## 2024-10-15 NOTE — Telephone Encounter (Signed)
 Requested Prescriptions  Refused Prescriptions Disp Refills   cyanocobalamin  (VITAMIN B12) 1000 MCG/ML injection [Pharmacy Med Name: CYANOCOBALAMIN  1,000 MCG/ML VL] 3 mL 6    Sig: INJECT 1 ML (1,000 MCG TOTAL) INTO THE SKIN EVERY 30 (THIRTY) DAYS.     Endocrinology:  Vitamins - Vitamin B12 Passed - 10/15/2024  5:47 PM      Passed - HCT in normal range and within 360 days    HCT  Date Value Ref Range Status  05/06/2019 29 29 - 41 Final   Hematocrit  Date Value Ref Range Status  07/21/2024 41.2 34.0 - 46.6 % Final         Passed - HGB in normal range and within 360 days    Hemoglobin  Date Value Ref Range Status  07/21/2024 13.3 11.1 - 15.9 g/dL Final         Passed - B12 Level in normal range and within 360 days    Vitamin B-12  Date Value Ref Range Status  01/02/2024 515 232 - 1,245 pg/mL Final         Passed - Valid encounter within last 12 months    Recent Outpatient Visits           1 month ago Psoriatic arthritis [facility])   [place] [facility] [place], Jon HERO, MD   2 months ago Left ear pain   [place] [facility] [place], Jon HERO, MD   5 months ago Acute otitis externa of left ear, unspecified type   [place] [facility] Simmons-Robinson, [place], MD   8 months ago Non-recurrent acute suppurative otitis media of left ear without spontaneous rupture of tympanic membrane   [facility] [place], Jon HERO, MD

## 2024-10-19 ENCOUNTER — Other Ambulatory Visit

## 2024-10-21 ENCOUNTER — Encounter: Payer: Self-pay | Admitting: Ophthalmology

## 2024-10-22 ENCOUNTER — Other Ambulatory Visit: Payer: Self-pay | Admitting: Family Medicine

## 2024-10-22 NOTE — Discharge Instructions (Signed)

## 2024-10-23 NOTE — Telephone Encounter (Signed)
 Requested Prescriptions  Pending Prescriptions Disp Refills   nortriptyline  (PAMELOR ) 25 MG capsule [Pharmacy Med Name: NORTRIPTYLINE  HCL 25 MG CAP] 90 capsule 0    Sig: TAKE 1 CAPSULE BY MOUTH AT BEDTIME.     Psychiatry:  Antidepressants - Heterocyclics (TCAs) Passed - 10/23/2024  2:06 PM      Passed - Valid encounter within last 6 months    Recent Outpatient Visits           1 month ago Psoriatic arthritis [facility])   [place] [facility] [place], Jon HERO, MD   3 months ago Left ear pain   [place] [facility] [place], Jon HERO, MD   5 months ago Acute otitis externa of left ear, unspecified type   [facility] Simmons[place]Rob[place], [place], MD   8 months ago Non-recurrent acute suppurative otitis media of left ear without spontaneous rupture of tympanic membrane   [facility] [place], Jon HERO, MD

## 2024-10-26 ENCOUNTER — Ambulatory Visit: Payer: Self-pay | Admitting: Anesthesiology

## 2024-10-26 ENCOUNTER — Encounter
Admission: RE | Disposition: A | Discharge status: Home / Self Care | Payer: Self-pay | Source: Home / Self Care | Attending: Ophthalmology

## 2024-10-26 ENCOUNTER — Encounter: Payer: Self-pay | Admitting: Ophthalmology

## 2024-10-26 ENCOUNTER — Other Ambulatory Visit: Payer: Self-pay

## 2024-10-26 ENCOUNTER — Ambulatory Visit
Admission: RE | Admit: 2024-10-26 | Discharge: 2024-10-26 | Disposition: A | Discharge status: Home / Self Care | Attending: Ophthalmology | Admitting: Ophthalmology

## 2024-10-26 DIAGNOSIS — R519 Headache, unspecified: Secondary | ICD-10-CM | POA: Insufficient documentation

## 2024-10-26 DIAGNOSIS — K219 Gastro-esophageal reflux disease without esophagitis: Secondary | ICD-10-CM | POA: Insufficient documentation

## 2024-10-26 DIAGNOSIS — H2512 Age-related nuclear cataract, left eye: Secondary | ICD-10-CM | POA: Diagnosis present

## 2024-10-26 DIAGNOSIS — Z79899 Other long term (current) drug therapy: Secondary | ICD-10-CM | POA: Insufficient documentation

## 2024-10-26 DIAGNOSIS — E785 Hyperlipidemia, unspecified: Secondary | ICD-10-CM | POA: Insufficient documentation

## 2024-10-26 DIAGNOSIS — Z87891 Personal history of nicotine dependence: Secondary | ICD-10-CM | POA: Insufficient documentation

## 2024-10-26 DIAGNOSIS — Z79818 Long term (current) use of other agents affecting estrogen receptors and estrogen levels: Secondary | ICD-10-CM | POA: Diagnosis not present

## 2024-10-26 DIAGNOSIS — I10 Essential (primary) hypertension: Secondary | ICD-10-CM | POA: Insufficient documentation

## 2024-10-26 DIAGNOSIS — L4050 Arthropathic psoriasis, unspecified: Secondary | ICD-10-CM | POA: Diagnosis not present

## 2024-10-26 HISTORY — DX: Fusion of spine, lumbar region: M43.26

## 2024-10-26 HISTORY — DX: Pneumonia, unspecified organism: J18.9

## 2024-10-26 HISTORY — DX: Psoriasis vulgaris: L40.0

## 2024-10-26 HISTORY — DX: Atherosclerosis of aorta: I70.0

## 2024-10-26 HISTORY — DX: Spinal stenosis, cervical region: M48.02

## 2024-10-26 HISTORY — DX: Arthropathic psoriasis, unspecified: L40.50

## 2024-10-26 HISTORY — DX: Other chronic pain: G89.29

## 2024-10-26 HISTORY — DX: Other forms of dyspnea: R06.09

## 2024-10-26 SURGERY — PHACOEMULSIFICATION, CATARACT, WITH IOL INSERTION
Anesthesia: Monitor Anesthesia Care | Laterality: Left

## 2024-10-26 MED ORDER — SIGHTPATH DOSE#1 BSS IO SOLN
INTRAOCULAR | Status: DC | PRN
Start: 2024-10-26 — End: 2024-10-26
  Administered 2024-10-26: 15 mL via INTRAOCULAR

## 2024-10-26 MED ORDER — PHENYLEPHRINE HCL 10 % OP SOLN
1.0000 [drp] | OPHTHALMIC | Status: AC
  Administered 2024-10-26 (×3): 1 [drp] via OPHTHALMIC

## 2024-10-26 MED ORDER — MIDAZOLAM HCL 2 MG/2ML IJ SOLN
INTRAMUSCULAR | Status: AC
  Filled 2024-10-26: qty 2

## 2024-10-26 MED ORDER — CYCLOPENTOLATE HCL 2 % OP SOLN
OPHTHALMIC | Status: AC
  Filled 2024-10-26: qty 2

## 2024-10-26 MED ORDER — TETRACAINE HCL 0.5 % OP SOLN
1.0000 [drp] | OPHTHALMIC | Status: DC | PRN
  Administered 2024-10-26 (×3): 1 [drp] via OPHTHALMIC

## 2024-10-26 MED ORDER — TETRACAINE HCL 0.5 % OP SOLN
OPHTHALMIC | Status: AC
  Filled 2024-10-26: qty 4

## 2024-10-26 MED ORDER — MIDAZOLAM HCL (PF) 2 MG/2ML IJ SOLN
INTRAMUSCULAR | Status: DC | PRN
  Administered 2024-10-26: 2 mg via INTRAVENOUS

## 2024-10-26 MED ORDER — ONDANSETRON HCL 4 MG/2ML IJ SOLN
INTRAMUSCULAR | Status: AC
  Filled 2024-10-26: qty 2

## 2024-10-26 MED ORDER — SIGHTPATH DOSE#1 NA HYALUR & NA CHOND-NA HYALUR IO KIT
PACK | INTRAOCULAR | Status: DC | PRN
  Administered 2024-10-26: 1 via OPHTHALMIC

## 2024-10-26 MED ORDER — LACTATED RINGERS IV SOLN
INTRAVENOUS | Status: DC
Start: 2024-10-26 — End: 2024-10-26

## 2024-10-26 MED ORDER — FENTANYL CITRATE (PF) 100 MCG/2ML IJ SOLN
INTRAMUSCULAR | Status: AC
  Filled 2024-10-26: qty 2

## 2024-10-26 MED ORDER — SIGHTPATH DOSE#1 BSS IO SOLN
INTRAOCULAR | Status: DC | PRN
  Administered 2024-10-26: 99 mL via OPHTHALMIC

## 2024-10-26 MED ORDER — MOXIFLOXACIN HCL 0.5 % OP SOLN
OPHTHALMIC | Status: DC | PRN
  Administered 2024-10-26: .2 mL via OPHTHALMIC

## 2024-10-26 MED ORDER — LIDOCAINE HCL (PF) 2 % IJ SOLN
INTRAOCULAR | Status: DC | PRN
  Administered 2024-10-26: 1 mL via INTRAOCULAR

## 2024-10-26 MED ORDER — ONDANSETRON HCL 4 MG/2ML IJ SOLN
4.0000 mg | Freq: Once | INTRAMUSCULAR | Status: AC
  Administered 2024-10-26: 4 mg via INTRAVENOUS

## 2024-10-26 MED ORDER — FENTANYL CITRATE (PF) 100 MCG/2ML IJ SOLN
INTRAMUSCULAR | Status: DC | PRN
  Administered 2024-10-26 (×2): 50 ug via INTRAVENOUS

## 2024-10-26 MED ORDER — CYCLOPENTOLATE HCL 2 % OP SOLN
1.0000 [drp] | OPHTHALMIC | Status: AC
  Administered 2024-10-26 (×3): 1 [drp] via OPHTHALMIC

## 2024-10-26 MED ORDER — PHENYLEPHRINE HCL 10 % OP SOLN
OPHTHALMIC | Status: AC
  Filled 2024-10-26: qty 5

## 2024-10-26 SURGICAL SUPPLY — 9 items
DISSECTOR HYDRO NUCLEUS 50X22 (MISCELLANEOUS) ×1 IMPLANT
FEE CATARACT SUITE SIGHTPATH (MISCELLANEOUS) ×1 IMPLANT
GLOVE PI ULTRA LF STRL 7.5 (GLOVE) ×1 IMPLANT
GLOVE SURG SYN 6.5 PF PI BL (GLOVE) ×1 IMPLANT
GLOVE SURG SYN 8.5 PF PI BL (GLOVE) ×1 IMPLANT
LENS IOL TECNIS EYHANCE 17.5 (Intraocular Lens) IMPLANT
NDL FILTER BLUNT 18X1 1/2 (NEEDLE) ×1 IMPLANT
NEEDLE FILTER BLUNT 18X1 1/2 (NEEDLE) ×1 IMPLANT
SYR 3ML LL SCALE MARK (SYRINGE) ×1 IMPLANT

## 2024-10-26 NOTE — Anesthesia Preprocedure Evaluation (Signed)
 Anesthesia Evaluation  Patient identified by MRN, date of birth, ID band Patient awake    Reviewed: Allergy & Precautions, NPO status , Patient's Chart, lab work & pertinent test results  History of Anesthesia Complications (+) PONV and history of anesthetic complications  Airway Mallampati: II  TM Distance: >3 FB     Dental   Pulmonary former smoker   breath sounds clear to auscultation       Cardiovascular hypertension,  Rhythm:Regular Rate:Normal     Neuro/Psych  Headaches    GI/Hepatic ,GERD  ,,  Endo/Other    Renal/GU      Musculoskeletal   Abdominal   Peds  Hematology   Anesthesia Other Findings   Reproductive/Obstetrics                              Anesthesia Physical Anesthesia Plan  ASA: 2  Anesthesia Plan: MAC   Post-op Pain Management:    Induction: Intravenous  PONV Risk Score and Plan:   Airway Management Planned: Natural Airway and Nasal Cannula  Additional Equipment:   Intra-op Plan:   Post-operative Plan:   Informed Consent: I have reviewed the patients History and Physical, chart, labs and discussed the procedure including the risks, benefits and alternatives for the proposed anesthesia with the patient or authorized representative who has indicated his/her understanding and acceptance.     Dental Advisory Given  Plan Discussed with: Anesthesiologist, CRNA and Surgeon  Anesthesia Plan Comments: (Patient consented for risks of anesthesia including but not limited to:  - adverse reactions to medications - damage to eyes, teeth, lips or other oral mucosa - nerve damage due to positioning  - sore throat or hoarseness - Damage to heart, brain, nerves, lungs, other parts of body or loss of life  Patient voiced understanding and assent.)        Anesthesia Quick Evaluation

## 2024-10-26 NOTE — Op Note (Signed)
 OPERATIVE NOTE  Shannon Grimes [id number] 10/26/2024   PREOPERATIVE DIAGNOSIS:  Nuclear sclerotic cataract left eye.  H25.12   POSTOPERATIVE DIAGNOSIS:    Nuclear sclerotic cataract left eye.     PROCEDURE:  Phacoemusification with posterior chamber intraocular lens placement of the left eye   LENS:   Implant Name Type Inv. Item Serial No. Manufacturer Lot No. LRB No. Used Action  LENS IOL TECNIS EYHANCE 17.5 - [id number] Intraocular Lens LENS IOL TECNIS EYHANCE 17.5 [id number] [employer]  Left 1 Implanted      Procedure(s): PHACOEMULSIFICATION, CATARACT, WITH IOL INSERTION 8.44, 00:55.5 (Left)  SURGEON:  Adine Novak, MD, MPH   ANESTHESIA:  Topical with tetracaine  drops augmented with 1% preservative-free intracameral lidocaine .  ESTIMATED BLOOD LOSS: <1 mL   COMPLICATIONS:  None.   DESCRIPTION OF PROCEDURE:  The patient was identified in the holding room and transported to the operating room and placed in the supine position under the operating microscope.  The left eye was identified as the operative eye and it was prepped and draped in the usual sterile ophthalmic fashion.   A 1.0 millimeter clear-corneal paracentesis was made at the 5:00 position. 0.5 ml of preservative-free 1% lidocaine  with epinephrine  was injected into the anterior chamber.  The anterior chamber was filled with viscoelastic.  A 2.4 millimeter keratome was used to make a near-clear corneal incision at the 2:00 position.  A curvilinear capsulorrhexis was made with a cystotome and capsulorrhexis forceps.  Balanced salt  solution was used to hydrodissect and hydrodelineate the nucleus.   Phacoemulsification was then used in stop and chop fashion to remove the lens nucleus and epinucleus.  The remaining cortex was then removed using the irrigation and aspiration handpiece. Viscoelastic was then placed into the capsular bag to distend it for lens placement.  A lens was then injected into the capsular bag.  The  remaining viscoelastic was aspirated.   Wounds were hydrated with balanced salt  solution.  The anterior chamber was inflated to a physiologic pressure with balanced salt  solution.  Intracameral vigamox  0.1 mL undiltued was injected into the eye and a drop placed onto the ocular surface.  No wound leaks were noted.  The patient was taken to the recovery room in stable condition without complications of anesthesia or surgery  Adine Novak 10/26/2024, 11:34 AM

## 2024-10-26 NOTE — H&P (Signed)
 Shannon Grimes   Primary Care Physician:  Myrla Jon HERO, MD Ophthalmologist: Dr. Adine Novak  Pre-Procedure History & Physical: HPI:  Shannon Grimes is a 73 y.o. female here for cataract surgery.   Past Medical History:  Diagnosis Date   Allergy    Anemia    pernicious   Aortic atherosclerosis    Avascular necrosis (HCC)    right hip   Blood transfusion without reported diagnosis    after hysterectomy   Carpal tunnel syndrome on right    Cervical spinal stenosis    Chronic back pain    Cubital tunnel syndrome on right    DOE (dyspnea on exertion)    Fusion of spine of lumbar region    GERD (gastroesophageal reflux disease)    diet controlled no meds   Hyperlipidemia    Hypertension    Migraine headache    3-4x/month   Neuromuscular disorder (HCC)    multiple ortho issues with abnormal EMGs   Osteoarthritis    Plaque psoriasis    Pneumonia    when teenager   PONV (postoperative nausea and vomiting)    also triggers migraines   Psoriatic arthritis (HCC)     Past Surgical History:  Procedure Laterality Date   ANKLE FRACTURE SURGERY  1994   screws removed in 2007   ANKLE FUSION Right 2006   APPENDECTOMY  1984   CATARACT EXTRACTION W/PHACO Right 09/15/2018   Procedure: CATARACT EXTRACTION PHACO AND INTRAOCULAR LENS PLACEMENT (IOC) RIGHT;  Surgeon: Novak Adine Anes, MD;  [facility];  Service: Ophthalmology;  Laterality: Right;   CHOLECYSTECTOMY  1994   CLOSED REDUCTION HIP DISLOCATION     Hip abductor attachment Right 2013   HIP FRACTURE SURGERY  2001   HIP SURGERY Right    Revision   ILIOTIBIAL BAND RELEASE  2016   LAMINECTOMY     Maarch 21, 2025   LUMBAR FUSION  1999   L5-S1 fusion   LUMBAR FUSION  2004   L3-L4 fusion   LUMBAR FUSION  2016   L2-L5, needed revision of previous surgeries   SACROILIAC JOINT FUSION Right 2012   STEROID INJECTION TO SCAR     TOTAL ABDOMINAL HYSTERECTOMY W/ BILATERAL SALPINGOOPHORECTOMY  1984    endometriosis   TOTAL HIP ARTHROPLASTY Right 2009    Prior to Admission medications   Medication Sig Start Date End Date Taking? Authorizing Provider  cyanocobalamin  (VITAMIN B12) 1000 MCG/ML injection Inject 1 mL (1,000 mcg total) into the skin every 30 (thirty) days. 10/02/23  Yes Bacigalupo, Jon HERO, MD  estradiol  (ESTRACE ) 2 MG tablet TAKE 1 TABLET BY MOUTH DAILY 08/25/24  Yes Bacigalupo, Angela M, MD  ezetimibe  (ZETIA ) 10 MG tablet TAKE 1 TABLET BY MOUTH DAILY 01/16/24  Yes Bacigalupo, Angela M, MD  gabapentin  (NEURONTIN ) 400 MG capsule TAKE 1 CAPSULE BY MOUTH 3 TIMES  DAILY 01/08/24  Yes Bacigalupo, Angela M, MD  leflunomide (ARAVA) 10 MG tablet Take 10 mg by mouth daily.   Yes [provider]  lisinopril -hydrochlorothiazide  (ZESTORETIC ) 10-12.5 MG tablet TAKE 1 TABLET BY MOUTH DAILY 08/25/24  Yes Bacigalupo, Angela M, MD  nortriptyline  (PAMELOR ) 25 MG capsule TAKE 1 CAPSULE BY MOUTH AT BEDTIME. 10/23/24  Yes Bacigalupo, Angela M, MD  oxyCODONE-acetaminophen (PERCOCET) 10-325 MG tablet Take 1 tablet by mouth 3 (three) times daily as needed for pain. 05/06/23  Yes Sharlet Clarity, NP  promethazine  (PHENERGAN ) 25 MG tablet TAKE 1 TABLET EVERY 4 HOURSAS NEEDED 08/22/23  Yes  Myrla Jon HERO, MD  Secukinumab (COSENTYX IV) Inject into the vein as directed.   Yes [provider]  tiZANidine  (ZANAFLEX ) 4 MG tablet TAKE 1 TABLET BY MOUTH TWICE  DAILY 04/27/24  Yes Bacigalupo, Angela M, MD  betamethasone  dipropionate 0.05 % cream Apply topically 2 (two) times daily. Patient taking differently: Apply 1 application  topically as needed. 09/15/24   Bacigalupo, Angela M, MD  SUMAtriptan  (IMITREX ) 100 MG tablet TAKE 1 TABLET AT ONSET, MAY REPEAT IN 2    HOURS IF HEADACHE PERSISTS OR RECURS 03/07/23   Myrla Jon HERO, MD  Syringe/Needle, Disp, (SYRINGE 3CC/25GX5/8) 25G X 5/8 3 ML MISC 1 each by Does not apply route every 30 (thirty) days. 10/02/23   Myrla Jon HERO, MD    Allergies  as of 09/28/2024 - Review Complete 09/15/2024  Allergen Reaction Noted   Pentazocine Anaphylaxis and Other (See Comments) 04/23/2019   Statins Other (See Comments)    Morphine Nausea And Vomiting     Family History  Problem Relation Age of Onset   Stroke Mother 57   Congestive Heart Failure Mother    Heart disease Father 45   Colon cancer Sister 8   Kidney failure Sister        chronic   Liver disease Sister        end stage   Colon cancer Brother 60   Alcoholism Brother        recovering   Mitral valve prolapse Sister    Breast cancer Neg Hx     Social History   Socioeconomic History   Marital status: Married    Spouse name: Maude   Number of children: 2   Years of education: bachelor's   Highest education level: Bachelor's degree (e.g., BA, AB, BS)  Occupational History   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  Tobacco Use   Smoking status: Former    Current packs/day: 0.00    Types: Cigarettes    Start date: 01/23/1973    Quit date: 01/23/1983    Years since quitting: 41.7   Smokeless tobacco: Never   Tobacco comments:    was social smoker, not everyday  Vaping Use   Vaping status: Never Used  Substance and Sexual Activity   Alcohol use: No   Drug use: Never   Sexual activity: Yes    Birth control/protection: Surgical  Other Topics Concern   Not on file  Social History Narrative   Not on file   Social Drivers of Health   Financial Resource Strain: Low Risk  (09/29/2024)   Received from [facility]   Overall Financial Resource Strain (CARDIA)    Difficulty of Paying Living Expenses: Not hard at all  Food Insecurity: No Food Insecurity (09/29/2024)   [facility]   Hunger Vital Sign    Within the past 12 months, you worried that your food would run out before you got the money to buy more.: Never true    Within the past 12 months, the food you bought just didn't last and you didn't have money to  get more.: Never true  Transportation Needs: No Transportation Needs (09/29/2024)   Received from [facility] Transportation    In the past 12 months, has lack of transportation kept you from medical appointments or from getting medications?: No    Lack of Transportation (Non-Medical): No  Physical Activity: Insufficiently Active (07/17/2024)   Exercise  Vital Sign    Days of Exercise per Week: 2 days    Minutes of Exercise per Session: 20 min  Stress: Stress Concern Present (07/17/2024)   [employer] of Occupational Health - Occupational Stress Questionnaire    Feeling of Stress: To some extent  Social Connections: Socially Integrated (07/17/2024)   Social Connection and Isolation Panel    Frequency of Communication with Friends and Family: More than three times a week    Frequency of Social Gatherings with Friends and Family: Twice a week    Attends Religious Services: 1 to 4 times per year    Active Member of [employer] or Organizations: Yes    Attends Engineer, Structural: More than 4 times per year    Marital Status: Married  Catering Manager Violence: Not At Risk (01/02/2024)   Humiliation, Afraid, Rape, and Kick questionnaire    Fear of Current or Ex-Partner: No    Emotionally Abused: No    Physically Abused: No    Sexually Abused: No    Review of Systems: See HPI, otherwise negative ROS  Physical Exam: BP (!) 145/72   Pulse 84   Temp (!) 97.1 F (36.2 C)   Ht 5' 8 (1.727 m)   Wt 76.7 kg   SpO2 95%   BMI 25.70 kg/m  General:   Alert, cooperative. Head:  Normocephalic and atraumatic. Respiratory:  Normal work of breathing. Cardiovascular:  NAD  Impression/Plan: Braelyn Jenson is here for cataract surgery.  Risks, benefits, limitations, and alternatives regarding cataract surgery have been reviewed with the patient.  Questions have been answered.  All parties agreeable.   Adine Novak, MD  10/26/2024, 11:04 AM

## 2024-10-26 NOTE — Transfer of Care (Signed)
 Immediate Anesthesia Transfer of Care Note  Patient: Shannon Grimes  Procedure(s) Performed: PHACOEMULSIFICATION, CATARACT, WITH IOL INSERTION 8.44, 00:55.5 (Left)  Patient Location: PACU  Anesthesia Type: MAC  Level of Consciousness: awake, alert  and patient cooperative  Airway and Oxygen Therapy: Patient Spontanous Breathing and Patient connected to supplemental oxygen  Post-op Assessment: Post-op Vital signs reviewed, Patient's Cardiovascular Status Stable, Respiratory Function Stable, Patent Airway and No signs of Nausea or vomiting  Post-op Vital Signs: Reviewed and stable  Complications: No notable events documented.

## 2024-10-27 NOTE — Anesthesia Postprocedure Evaluation (Signed)
 Anesthesia Post Note  Patient: Loucile Posner  Procedure(s) Performed: PHACOEMULSIFICATION, CATARACT, WITH IOL INSERTION 8.44, 00:55.5 (Left)  Patient location during evaluation: PACU Anesthesia Type: MAC Level of consciousness: awake and alert Pain management: pain level controlled Vital Signs Assessment: post-procedure vital signs reviewed and stable Respiratory status: spontaneous breathing, nonlabored ventilation, respiratory function stable and patient connected to nasal cannula oxygen Cardiovascular status: blood pressure returned to baseline and stable Postop Assessment: no apparent nausea or vomiting Anesthetic complications: no   No notable events documented.   Last Vitals:  Vitals:   10/26/24 1135 10/26/24 1140  BP: 132/67 125/60  Pulse: 82 72  Resp: 20 13  Temp: (!) 36.1 C (!) 36.1 C  SpO2: 96% 95%    Last Pain:  Vitals:   10/26/24 1140  PainSc: 0-No pain                 Debby Mines

## 2024-11-06 ENCOUNTER — Telehealth: Payer: Self-pay | Admitting: Cardiovascular Disease

## 2024-11-06 NOTE — Telephone Encounter (Signed)
 Pt is scheduled for an Echo on 12/07/24 but the authorization expires 11/29/24 can you please extend the authorization until after the 12/07/24. Please make pt aware when completed. Please advise

## 2024-11-17 ENCOUNTER — Other Ambulatory Visit: Payer: Self-pay | Admitting: Family Medicine

## 2024-11-17 DIAGNOSIS — E782 Mixed hyperlipidemia: Secondary | ICD-10-CM

## 2024-11-18 NOTE — Telephone Encounter (Signed)
 Too soon for refill.  Requested Prescriptions  Pending Prescriptions Disp Refills   ezetimibe  (ZETIA ) 10 MG tablet [Pharmacy Med Name: Ezetimibe  10 MG Oral Tablet] 90 tablet 3    Sig: TAKE 1 TABLET BY MOUTH DAILY     Cardiovascular:  Antilipid - Sterol Transport Inhibitors Failed - 11/18/2024 10:01 AM      Failed - Lipid Panel in normal range within the last 12 months    Cholesterol, Total  Date Value Ref Range Status  01/02/2024 211 (H) 100 - 199 mg/dL Final   LDL Cholesterol (Calc)  Date Value Ref Range Status  10/16/2017 132 (H) mg/dL (calc) Final    Comment:    Reference range: <100 . Desirable range <100 mg/dL for primary prevention;   <70 mg/dL for patients with CHD or diabetic patients  with > or = 2 CHD risk factors. SABRA LDL-C is now calculated using the Martin-Hopkins  calculation, which is a validated novel method providing  better accuracy than the Friedewald equation in the  estimation of LDL-C.  Gladis APPLETHWAITE et al. SANDREA. 7986;689(80): 2061-2068  (http://education.[facility].com/faq/FAQ164)    LDL Chol Calc (NIH)  Date Value Ref Range Status  01/02/2024 118 (H) 0 - 99 mg/dL Final   HDL  Date Value Ref Range Status  01/02/2024 43 >39 mg/dL Final   Triglycerides  Date Value Ref Range Status  01/02/2024 286 (H) 0 - 149 mg/dL Final         Passed - AST in normal range and within 360 days    AST  Date Value Ref Range Status  04/30/2024 29 0 - 40 IU/L Final         Passed - ALT in normal range and within 360 days    ALT  Date Value Ref Range Status  04/30/2024 13 0 - 32 IU/L Final         Passed - Patient is not pregnant      Passed - Valid encounter within last 12 months    Recent Outpatient Visits           2 months ago Psoriatic arthritis [facility])   [place] [facility] [place], Jon HERO, MD   4 months ago Left ear pain   [place] [facility] [place], Jon HERO, MD   6 months ago Acute otitis externa  of left ear, unspecified type   [facility] Simmons-Robinson, [place], MD   9 months ago Non-recurrent acute suppurative otitis media of left ear without spontaneous rupture of tympanic membrane   [place] [facility] [place], Jon HERO, MD               gabapentin  (NEURONTIN ) 400 MG capsule [Pharmacy Med Name: Gabapentin  400 MG Oral Capsule] 270 capsule 3    Sig: TAKE 1 CAPSULE BY MOUTH 3 TIMES  DAILY     Neurology: Anticonvulsants - gabapentin  Passed - 11/18/2024 10:01 AM      Passed - Cr in normal range and within 360 days    Creat  Date Value Ref Range Status  05/06/2019 0.9  Final   Creatinine, Ser  Date Value Ref Range Status  04/30/2024 0.87 0.57 - 1.00 mg/dL Final         Passed - Completed PHQ-2 or PHQ-9 in the last 360 days      Passed - Valid encounter within last 12 months    Recent Outpatient Visits  2 months ago Psoriatic arthritis [facility])   [place] [facility] [place], Jon HERO, MD   4 months ago Left ear pain   [place] [facility] [place], Jon HERO, MD   6 months ago Acute otitis externa of left ear, unspecified type   [place] [facility] Simmons-Robinson, [place], MD   9 months ago Non-recurrent acute suppurative otitis media of left ear without spontaneous rupture of tympanic membrane   [facility] [place], Jon HERO, MD

## 2024-11-24 NOTE — Telephone Encounter (Signed)
 Patient is following up due to not hearing back from anyone in over 2 weeks. Please advise soon.

## 2024-12-07 ENCOUNTER — Ambulatory Visit: Attending: Cardiovascular Disease

## 2024-12-07 DIAGNOSIS — R0609 Other forms of dyspnea: Secondary | ICD-10-CM | POA: Diagnosis not present

## 2024-12-07 LAB — ECHOCARDIOGRAM COMPLETE
AR max vel: 3.49 cm2
AV Area VTI: 3.29 cm2
AV Area mean vel: 3.64 cm2
AV Mean grad: 6 mmHg
AV Peak grad: 10.4 mmHg
Ao pk vel: 1.61 m/s
Area-P 1/2: 2.99 cm2
S' Lateral: 2.47 cm

## 2024-12-20 ENCOUNTER — Ambulatory Visit: Payer: Self-pay | Admitting: Cardiovascular Disease

## 2024-12-25 ENCOUNTER — Encounter: Payer: Self-pay | Admitting: Family Medicine

## 2025-01-18 ENCOUNTER — Telehealth: Payer: Self-pay | Admitting: Family Medicine

## 2025-01-18 ENCOUNTER — Encounter: Payer: Self-pay | Admitting: Family Medicine

## 2025-01-18 ENCOUNTER — Encounter: Admitting: Family Medicine

## 2025-01-18 NOTE — Telephone Encounter (Unsigned)
 Copied from CRM [id number]. Topic: Clinical - Medical Advice >> Jan 18, 2025  1:05 PM Tiffini S wrote: Reason for CRM: Patient rescheduled the appointment to 02/16/25 and need refills of B12 with syringes and sumatriptan  and promethazine  before 01/24/25 which is my next injection date that require me- patient is asking to talk with the office for a sooner appointment and discuss with a nurse  Please call the patient at [phone]

## 2025-01-19 ENCOUNTER — Other Ambulatory Visit: Payer: Self-pay

## 2025-01-19 DIAGNOSIS — E538 Deficiency of other specified B group vitamins: Secondary | ICD-10-CM

## 2025-01-19 NOTE — Telephone Encounter (Signed)
 I have changed to rx refill and routed to dr b for review.

## 2025-01-19 NOTE — Telephone Encounter (Signed)
 LOV 11/17/24 NOV 02/16/25 LRF vit b12 10/02/23 q10 r1 LRF promethazine  08/22/23 q30 r1 LRF imitrex  03/07/23 q18 r5 LABS 01/02/24 b12 515

## 2025-01-21 MED ORDER — PROMETHAZINE HCL 25 MG PO TABS: ORAL_TABLET | ORAL | 1 refills | Status: AC

## 2025-01-21 MED ORDER — "SYRINGE 25G X 5/8"" 3 ML MISC": 1.0000 | 1 refills | Status: AC

## 2025-01-21 MED ORDER — CYANOCOBALAMIN 1000 MCG/ML IJ SOLN: 1000.0000 ug | INTRAMUSCULAR | 1 refills | Status: AC

## 2025-01-21 MED ORDER — SUMATRIPTAN SUCCINATE 100 MG PO TABS: ORAL_TABLET | ORAL | 5 refills | Status: AC

## 2025-01-22 ENCOUNTER — Other Ambulatory Visit: Payer: Self-pay | Admitting: Family Medicine

## 2025-01-25 ENCOUNTER — Encounter: Admitting: Family Medicine

## 2025-01-25 DIAGNOSIS — Z0000 Encounter for general adult medical examination without abnormal findings: Secondary | ICD-10-CM

## 2025-01-25 DIAGNOSIS — Z130 Encounter for screening for diseases of the blood and blood-forming organs and certain disorders involving the immune mechanism: Secondary | ICD-10-CM

## 2025-01-25 DIAGNOSIS — E782 Mixed hyperlipidemia: Secondary | ICD-10-CM

## 2025-01-25 DIAGNOSIS — I10 Essential (primary) hypertension: Secondary | ICD-10-CM

## 2025-01-25 DIAGNOSIS — R7303 Prediabetes: Secondary | ICD-10-CM

## 2025-02-16 ENCOUNTER — Encounter: Admitting: Family Medicine

## 2025-03-04 ENCOUNTER — Encounter: Admitting: Family Medicine
